# Patient Record
Sex: Female | Born: 1985 | Race: White | Smoking: Heavy tobacco smoker
Health system: Southern US, Community
[De-identification: ages and names within clinical notes are randomized; demographics above are authoritative.]

## PROBLEM LIST (undated history)

## (undated) DIAGNOSIS — N83299 Other ovarian cyst, unspecified side: Secondary | ICD-10-CM

## (undated) DIAGNOSIS — T7840XA Allergy, unspecified, initial encounter: Secondary | ICD-10-CM

## (undated) DIAGNOSIS — G43909 Migraine, unspecified, not intractable, without status migrainosus: Secondary | ICD-10-CM

## (undated) HISTORY — PX: CHOLECYSTECTOMY, LAPAROSCOPIC: SHX56

## (undated) HISTORY — DX: Allergy, unspecified, initial encounter: T78.40XA

## (undated) HISTORY — DX: Migraine, unspecified, not intractable, without status migrainosus: G43.909

## (undated) HISTORY — DX: Other ovarian cyst, unspecified side: N83.299

---

## 2009-04-09 HISTORY — PX: WISDOM TOOTH EXTRACTION: SHX21

## 2011-04-17 ENCOUNTER — Other Ambulatory Visit (HOSPITAL_COMMUNITY)
Admission: RE | Admit: 2011-04-17 | Discharge: 2011-04-17 | Disposition: A | Discharge status: Home / Self Care | Payer: Medicaid Other | Source: Ambulatory Visit | Attending: Family Medicine | Admitting: Family Medicine

## 2011-04-17 ENCOUNTER — Other Ambulatory Visit (HOSPITAL_COMMUNITY)
Admission: RE | Admit: 2011-04-17 | Discharge: 2011-04-17 | Disposition: A | Discharge status: Home / Self Care | Payer: Medicaid Other | Source: Ambulatory Visit | Attending: Unknown Physician Specialty | Admitting: Unknown Physician Specialty

## 2011-04-17 ENCOUNTER — Other Ambulatory Visit: Payer: Self-pay | Admitting: Nurse Practitioner

## 2011-04-17 DIAGNOSIS — R87619 Unspecified abnormal cytological findings in specimens from cervix uteri: Secondary | ICD-10-CM | POA: Insufficient documentation

## 2011-04-17 DIAGNOSIS — R87612 Low grade squamous intraepithelial lesion on cytologic smear of cervix (LGSIL): Secondary | ICD-10-CM | POA: Insufficient documentation

## 2013-10-13 ENCOUNTER — Ambulatory Visit: Payer: Self-pay | Admitting: Family

## 2013-10-28 ENCOUNTER — Ambulatory Visit (INDEPENDENT_AMBULATORY_CARE_PROVIDER_SITE_OTHER): Payer: Medicaid Other | Admitting: Family

## 2013-10-28 ENCOUNTER — Encounter: Payer: Self-pay | Admitting: Family

## 2013-10-28 VITALS — BP 120/76 | HR 75 | Temp 98.5°F | Ht 63.5 in | Wt 224.8 lb

## 2013-10-28 DIAGNOSIS — N946 Dysmenorrhea, unspecified: Secondary | ICD-10-CM

## 2013-10-28 DIAGNOSIS — Z0000 Encounter for general adult medical examination without abnormal findings: Secondary | ICD-10-CM

## 2013-10-28 DIAGNOSIS — R5383 Other fatigue: Secondary | ICD-10-CM

## 2013-10-28 DIAGNOSIS — R5381 Other malaise: Secondary | ICD-10-CM

## 2013-10-28 LAB — POCT URINE PREGNANCY: Preg Test, Ur: NEGATIVE

## 2013-10-28 MED ORDER — NORGESTIM-ETH ESTRAD TRIPHASIC 0.18/0.215/0.25 MG-25 MCG PO TABS: 1.0000 | ORAL_TABLET | Freq: Every day | ORAL | Status: DC

## 2013-10-28 NOTE — Patient Instructions (Addendum)
Fatigue Fatigue is a feeling of tiredness, lack of energy, lack of motivation, or feeling tired all the time. Having enough rest, good nutrition, and reducing stress will normally reduce fatigue. Consult your caregiver if it persists. The nature of your fatigue will help your caregiver to find out its cause. The treatment is based on the cause.  CAUSES  There are many causes for fatigue. Most of the time, fatigue can be traced to one or more of your habits or routines. Most causes fit into one or more of three general areas. They are: Lifestyle problems  Sleep disturbances.  Overwork.  Physical exertion.  Unhealthy habits.  Poor eating habits or eating disorders.  Alcohol and/or drug use .  Lack of proper nutrition (malnutrition). Psychological problems  Stress and/or anxiety problems.  Depression.  Grief.  Boredom. Medical Problems or Conditions  Anemia.  Pregnancy.  Thyroid gland problems.  Recovery from major surgery.  Continuous pain.  Emphysema or asthma that is not well controlled  Allergic conditions.  Diabetes.  Infections (such as mononucleosis).  Obesity.  Sleep disorders, such as sleep apnea.  Heart failure or other heart-related problems.  Cancer.  Kidney disease.  Liver disease.  Effects of certain medicines such as antihistamines, cough and cold remedies, prescription pain medicines, heart and blood pressure medicines, drugs used for treatment of cancer, and some antidepressants. SYMPTOMS  The symptoms of fatigue include:   Lack of energy.  Lack of drive (motivation).  Drowsiness.  Feeling of indifference to the surroundings. DIAGNOSIS  The details of how you feel help guide your caregiver in finding out what is causing the fatigue. You will be asked about your present and past health condition. It is important to review all medicines that you take, including prescription and non-prescription items. A thorough exam will be done.  You will be questioned about your feelings, habits, and normal lifestyle. Your caregiver may suggest blood tests, urine tests, or other tests to look for common medical causes of fatigue.  TREATMENT  Fatigue is treated by correcting the underlying cause. For example, if you have continuous pain or depression, treating these causes will improve how you feel. Similarly, adjusting the dose of certain medicines will help in reducing fatigue.  HOME CARE INSTRUCTIONS   Try to get the required amount of good sleep every night.  Eat a healthy and nutritious diet, and drink enough water throughout the day.  Practice ways of relaxing (including yoga or meditation).  Exercise regularly.  Make plans to change situations that cause stress. Act on those plans so that stresses decrease over time. Keep your work and personal routine reasonable.  Avoid street drugs and minimize use of alcohol.  Start taking a daily multivitamin after consulting your caregiver. SEEK MEDICAL CARE IF:   You have persistent tiredness, which cannot be accounted for.  You have fever.  You have unintentional weight loss.  You have headaches.  You have disturbed sleep throughout the night.  You are feeling sad.  You have constipation.  You have dry skin.  You have gained weight.  You are taking any new or different medicines that you suspect are causing fatigue.  You are unable to sleep at night.  You develop any unusual swelling of your legs or other parts of your body. SEEK IMMEDIATE MEDICAL CARE IF:   You are feeling confused.  Your vision is blurred.  You feel faint or pass out.  You develop severe headache.  You develop severe abdominal, pelvic, or   back pain.  You develop chest pain, shortness of breath, or an irregular or fast heartbeat.  You are unable to pass a normal amount of urine.  You develop abnormal bleeding such as bleeding from the rectum or you vomit blood.  You have thoughts  about harming yourself or committing suicide.  You are worried that you might harm someone else. MAKE SURE YOU:   Understand these instructions.  Will watch your condition.  Will get help right away if you are not doing well or get worse. Document Released: 01/21/2007 Document Revised: 06/18/2011 Document Reviewed: 01/21/2007 [facility] Patient Information 2015 [place], [place]. This information is not intended to replace advice given to you by your health care provider. Make sure you discuss any questions you have with your health care provider. Health Maintenance, Female A healthy lifestyle and preventative care can promote health and wellness.  Maintain regular health, dental, and eye exams.  Eat a healthy diet. Foods like vegetables, fruits, whole grains, low-fat dairy products, and lean protein foods contain the nutrients you need without too many calories. Decrease your intake of foods high in solid fats, added sugars, and salt. Get information about a proper diet from your caregiver, if necessary.  Regular physical exercise is one of the most important things you can do for your health. Most adults should get at least 150 minutes of moderate-intensity exercise (any activity that increases your heart rate and causes you to sweat) each week. In addition, most adults need muscle-strengthening exercises on 2 or more days a week.   Maintain a healthy weight. The body mass index (BMI) is a screening tool to identify possible weight problems. It provides an estimate of body fat based on height and weight. Your caregiver can help determine your BMI, and can help you achieve or maintain a healthy weight. For adults 20 years and older:  A BMI below 18.5 is considered underweight.  A BMI of 18.5 to 24.9 is normal.  A BMI of 25 to 29.9 is considered overweight.  A BMI of 30 and above is considered obese.  Maintain normal blood lipids and cholesterol by exercising and minimizing your intake of  saturated fat. Eat a balanced diet with plenty of fruits and vegetables. Blood tests for lipids and cholesterol should begin at age 35 and be repeated every 5 years. If your lipid or cholesterol levels are high, you are over 50, or you are a high risk for heart disease, you may need your cholesterol levels checked more frequently.Ongoing high lipid and cholesterol levels should be treated with medicines if diet and exercise are not effective.  If you smoke, find out from your caregiver how to quit. If you do not use tobacco, do not start.  Lung cancer screening is recommended for adults aged 68-80 years who are at high risk for developing lung cancer because of a history of smoking. Yearly low-dose computed tomography (CT) is recommended for people who have at least a 30-pack-year history of smoking and are a current smoker or have quit within the past 15 years. A pack year of smoking is smoking an average of 1 pack of cigarettes a day for 1 year (for example: 1 pack a day for 30 years or 2 packs a day for 15 years). Yearly screening should continue until the smoker has stopped smoking for at least 15 years. Yearly screening should also be stopped for people who develop a health problem that would prevent them from having lung cancer treatment.  If  you are pregnant, do not drink alcohol. If you are breastfeeding, be very cautious about drinking alcohol. If you are not pregnant and choose to drink alcohol, do not exceed 1 drink per day. One drink is considered to be 12 ounces (355 mL) of beer, 5 ounces (148 mL) of wine, or 1.5 ounces (44 mL) of liquor.  Avoid use of street drugs. Do not share needles with anyone. Ask for help if you need support or instructions about stopping the use of drugs.  High blood pressure causes heart disease and increases the risk of stroke. Blood pressure should be checked at least every 1 to 2 years. Ongoing high blood pressure should be treated with medicines, if weight loss  and exercise are not effective.  If you are 36 to 30 years old, ask your caregiver if you should take aspirin to prevent strokes.  Diabetes screening involves taking a blood sample to check your fasting blood sugar level. This should be done once every 3 years, after age 64, if you are within normal weight and without risk factors for diabetes. Testing should be considered at a younger age or be carried out more frequently if you are overweight and have at least 1 risk factor for diabetes.  Breast cancer screening is essential preventative care for women. You should practice "breast self-awareness." This means understanding the normal appearance and feel of your breasts and may include breast self-examination. Any changes detected, no matter how small, should be reported to a caregiver. Women in their 33s and 30s should have a clinical breast exam (CBE) by a caregiver as part of a regular health exam every 1 to 3 years. After age 55, women should have a CBE every year. Starting at age 29, women should consider having a mammogram (breast X-ray) every year. Women who have a family history of breast cancer should talk to their caregiver about genetic screening. Women at a high risk of breast cancer should talk to their caregiver about having an MRI and a mammogram every year.  Breast cancer gene (BRCA)-related cancer risk assessment is recommended for women who have family members with BRCA-related cancers. BRCA-related cancers include breast, ovarian, tubal, and peritoneal cancers. Having family members with these cancers may be associated with an increased risk for harmful changes (mutations) in the breast cancer genes BRCA1 and BRCA2. Results of the assessment will determine the need for genetic counseling and BRCA1 and BRCA2 testing.  The Pap test is a screening test for cervical cancer. Women should have a Pap test starting at age 12. Between ages 51 and 21, Pap tests should be repeated every 2 years.  Beginning at age 65, you should have a Pap test every 3 years as long as the past 3 Pap tests have been normal. If you had a hysterectomy for a problem that was not cancer or a condition that could lead to cancer, then you no longer need Pap tests. If you are between ages 38 and 51, and you have had normal Pap tests going back 10 years, you no longer need Pap tests. If you have had past treatment for cervical cancer or a condition that could lead to cancer, you need Pap tests and screening for cancer for at least 20 years after your treatment. If Pap tests have been discontinued, risk factors (such as a new sexual partner) need to be reassessed to determine if screening should be resumed. Some women have medical problems that increase the chance of getting cervical cancer.  In these cases, your caregiver may recommend more frequent screening and Pap tests.  The human papillomavirus (HPV) test is an additional test that may be used for cervical cancer screening. The HPV test looks for the virus that can cause the cell changes on the cervix. The cells collected during the Pap test can be tested for HPV. The HPV test could be used to screen women aged 63 years and older, and should be used in women of any age who have unclear Pap test results. After the age of 48, women should have HPV testing at the same frequency as a Pap test.  Colorectal cancer can be detected and often prevented. Most routine colorectal cancer screening begins at the age of 65 and continues through age 46. However, your caregiver may recommend screening at an earlier age if you have risk factors for colon cancer. On a yearly basis, your caregiver may provide home test kits to check for hidden blood in the stool. Use of a small camera at the end of a tube, to directly examine the colon (sigmoidoscopy or colonoscopy), can detect the earliest forms of colorectal cancer. Talk to your caregiver about this at age 54, when routine screening begins.  Direct examination of the colon should be repeated every 5 to 10 years through age 50, unless early forms of pre-cancerous polyps or small growths are found.  Hepatitis C blood testing is recommended for all people born from [birth year] through 1967 and any individual with known risks for hepatitis C.  Practice safe sex. Use condoms and avoid high-risk sexual practices to reduce the spread of sexually transmitted infections (STIs). Sexually active women aged 57 and younger should be checked for Chlamydia, which is a common sexually transmitted infection. Older women with new or multiple partners should also be tested for Chlamydia. Testing for other STIs is recommended if you are sexually active and at increased risk.  Osteoporosis is a disease in which the bones lose minerals and strength with aging. This can result in serious bone fractures. The risk of osteoporosis can be identified using a bone density scan. Women ages 42 and over and women at risk for fractures or osteoporosis should discuss screening with their caregivers. Ask your caregiver whether you should be taking a calcium supplement or vitamin D to reduce the rate of osteoporosis.  Menopause can be associated with physical symptoms and risks. Hormone replacement therapy is available to decrease symptoms and risks. You should talk to your caregiver about whether hormone replacement therapy is right for you.  Use sunscreen. Apply sunscreen liberally and repeatedly throughout the day. You should seek shade when your shadow is shorter than you. Protect yourself by wearing long sleeves, pants, a wide-brimmed hat, and sunglasses year round, whenever you are outdoors.  Notify your caregiver of new moles or changes in moles, especially if there is a change in shape or color. Also notify your caregiver if a mole is larger than the size of a pencil eraser.  Stay current with your immunizations. Document Released: 10/09/2010 Document Revised: 07/21/2012  Document Reviewed: 02/25/2013 [facility] Patient Information 2015 [place], [place]. This information is not intended to replace advice given to you by your health care provider. Make sure you discuss any questions you have with your health care provider. Smoking Cessation Quitting smoking is important to your health and has many advantages. However, it is not always easy to quit since nicotine is a very addictive drug. Often times, people try 3 times or more before  being able to quit. This document explains the best ways for you to prepare to quit smoking. Quitting takes hard work and a lot of effort, but you can do it. ADVANTAGES OF QUITTING SMOKING  You will live longer, feel better, and live better.  Your body will feel the impact of quitting smoking almost immediately.  Within 20 minutes, blood pressure decreases. Your pulse returns to its normal level.  After 8 hours, carbon monoxide levels in the blood return to normal. Your oxygen level increases.  After 24 hours, the chance of having a heart attack starts to decrease. Your breath, hair, and body stop smelling like smoke.  After 48 hours, damaged nerve endings begin to recover. Your sense of taste and smell improve.  After 72 hours, the body is virtually free of nicotine. Your bronchial tubes relax and breathing becomes easier.  After 2 to 12 weeks, lungs can hold more air. Exercise becomes easier and circulation improves.  The risk of having a heart attack, stroke, cancer, or lung disease is greatly reduced.  After 1 year, the risk of coronary heart disease is cut in half.  After 5 years, the risk of stroke falls to the same as a nonsmoker.  After 10 years, the risk of lung cancer is cut in half and the risk of other cancers decreases significantly.  After 15 years, the risk of coronary heart disease drops, usually to the level of a nonsmoker.  If you are pregnant, quitting smoking will improve your chances of having a healthy  baby.  The people you live with, especially any children, will be healthier.  You will have extra money to spend on things other than cigarettes. QUESTIONS TO THINK ABOUT BEFORE ATTEMPTING TO QUIT You may want to talk about your answers with your caregiver.  Why do you want to quit?  If you tried to quit in the past, what helped and what did not?  What will be the most difficult situations for you after you quit? How will you plan to handle them?  Who can help you through the tough times? Your family? Friends? A caregiver?  What pleasures do you get from smoking? What ways can you still get pleasure if you quit? Here are some questions to ask your caregiver:  How can you help me to be successful at quitting?  What medicine do you think would be best for me and how should I take it?  What should I do if I need more help?  What is smoking withdrawal like? How can I get information on withdrawal? GET READY  Set a quit date.  Change your environment by getting rid of all cigarettes, ashtrays, matches, and lighters in your home, car, or work. Do not let people smoke in your home.  Review your past attempts to quit. Think about what worked and what did not. GET SUPPORT AND ENCOURAGEMENT You have a better chance of being successful if you have help. You can get support in many ways.  Tell your family, friends, and co-workers that you are going to quit and need their support. Ask them not to smoke around you.  Get individual, group, or telephone counseling and support. Programs are available at [employer] and health centers. Call your local health department for information about programs in your area.  Spiritual beliefs and practices may help some smokers quit.  Download a "quit meter" on your computer to keep track of quit statistics, such as how long you have gone  without smoking, cigarettes not smoked, and money saved.  Get a self-help book about quitting smoking and  staying off of tobacco. Wilson yourself from urges to smoke. Talk to someone, go for a walk, or occupy your time with a task.  Change your normal routine. Take a different route to work. Drink tea instead of coffee. Eat breakfast in a different place.  Reduce your stress. Take a hot bath, exercise, or read a book.  Plan something enjoyable to do every day. Reward yourself for not smoking.  Explore interactive web-based programs that specialize in helping you quit. GET MEDICINE AND USE IT CORRECTLY Medicines can help you stop smoking and decrease the urge to smoke. Combining medicine with the above behavioral methods and support can greatly increase your chances of successfully quitting smoking.  Nicotine replacement therapy helps deliver nicotine to your body without the negative effects and risks of smoking. Nicotine replacement therapy includes nicotine gum, lozenges, inhalers, nasal sprays, and skin patches. Some may be available over-the-counter and others require a prescription.  Antidepressant medicine helps people abstain from smoking, but how this works is unknown. This medicine is available by prescription.  Nicotinic receptor partial agonist medicine simulates the effect of nicotine in your brain. This medicine is available by prescription. Ask your caregiver for advice about which medicines to use and how to use them based on your health history. Your caregiver will tell you what side effects to look out for if you choose to be on a medicine or therapy. Carefully read the information on the package. Do not use any other product containing nicotine while using a nicotine replacement product.  RELAPSE OR DIFFICULT SITUATIONS Most relapses occur within the first 3 months after quitting. Do not be discouraged if you start smoking again. Remember, most people try several times before finally quitting. You may have symptoms of withdrawal because your body  is used to nicotine. You may crave cigarettes, be irritable, feel very hungry, cough often, get headaches, or have difficulty concentrating. The withdrawal symptoms are only temporary. They are strongest when you first quit, but they will go away within 10-14 days. To reduce the chances of relapse, try to:  Avoid drinking alcohol. Drinking lowers your chances of successfully quitting.  Reduce the amount of caffeine you consume. Once you quit smoking, the amount of caffeine in your body increases and can give you symptoms, such as a rapid heartbeat, sweating, and anxiety.  Avoid smokers because they can make you want to smoke.  Do not let weight gain distract you. Many smokers will gain weight when they quit, usually less than 10 pounds. Eat a healthy diet and stay active. You can always lose the weight gained after you quit.  Find ways to improve your mood other than smoking. FOR MORE INFORMATION  www.smokefree.gov  Document Released: 03/20/2001 Document Revised: 09/25/2011 Document Reviewed: 07/05/2011 [facility] Patient Information 2015 [place], [place]. This information is not intended to replace advice given to you by your health care provider. Make sure you discuss any questions you have with your health care provider.

## 2013-10-28 NOTE — Progress Notes (Signed)
   Subjective:    Patient ID: Kristin Ballard, female    DOB: 03/18/1986, 28 y.o.   MRN: [id number]  HPI Pt presents to office today to establish care.  Pt currently taking birth control. States she is having week long cramps with menses. She states she currently takes motrin as needed, but states it is not helping. Pt also states she has been having overwhelming fatigue. Pt states she is sleeping well and feels as if she is getting enough sleep, but still feels fatigued.     Review of Systems  Constitutional: Positive for fatigue.  HENT: Negative.   Eyes: Negative.   Respiratory: Negative.  Negative for shortness of breath.   Cardiovascular: Negative.  Negative for palpitations.  Gastrointestinal: Negative.   Endocrine: Negative.   Genitourinary: Negative.   Musculoskeletal: Positive for back pain.  Neurological: Negative.  Negative for headaches.  Hematological: Negative.   Psychiatric/Behavioral: Negative.   All other systems reviewed and are negative.      Objective:   Physical Exam  Vitals reviewed. Constitutional: She is oriented to person, place, and time. She appears well-developed and well-nourished. No distress.  HENT:  Head: Normocephalic and atraumatic.  Right Ear: External ear normal.  Mouth/Throat: Oropharynx is clear and moist.  Eyes: Pupils are equal, round, and reactive to light.  Neck: Normal range of motion. Neck supple. No thyromegaly present.  Cardiovascular: Normal rate, regular rhythm, normal heart sounds and intact distal pulses.   No murmur heard. Pulmonary/Chest: Effort normal and breath sounds normal. No respiratory distress. She has no wheezes.  Abdominal: Soft. Bowel sounds are normal. She exhibits no distension. There is no tenderness.  Musculoskeletal: Normal range of motion. She exhibits no edema and no tenderness.  Neurological: She is alert and oriented to person, place, and time. She has normal reflexes. No cranial nerve deficit.  Skin: Skin  is warm and dry.  Psychiatric: She has a normal mood and affect. Her behavior is normal. Judgment and thought content normal.    BP 120/76  Pulse 75  Temp(Src) 98.5 F (36.9 C) (Oral)  Ht 5' 3.5" (1.613 m)  Wt 224 lb 12.8 oz (101.969 kg)  BMI 39.19 kg/m2  LMP 10/10/2013       Assessment & Plan:  1. Annual physical exam - CMP14+EGFR  2. Other malaise and fatigue - Anemia Profile B - POCT urine pregnancy - CMP14+EGFR - Thyroid Panel With TSH - Vit D  25 hydroxy (rtn osteoporosis monitoring)  3. Menstrual cramps -Discussed risks of DVT and dicussed s/s - Norgestimate-Ethinyl Estradiol Triphasic (ORTHO TRI-CYCLEN LO) 0.18/0.215/0.25 MG-25 MCG tab; Take 1 tablet by mouth daily.  Dispense: 3 Package; Refill: 4   Continue all meds Labs pending Health Maintenance reviewed Diet and exercise encouraged Smoking cessation discussed RTO 1 year  Evelina Dun, FNP

## 2013-10-29 LAB — ANEMIA PROFILE B
BASOS: 0 %
Basophils Absolute: 0 10*3/uL (ref 0.0–0.2)
EOS ABS: 0.1 10*3/uL (ref 0.0–0.4)
Eos: 1 %
FERRITIN: 42 ng/mL (ref 15–150)
FOLATE: 10.8 ng/mL (ref >3.0)
HCT: 39.4 % (ref 34.0–46.6)
HEMOGLOBIN: 13.5 g/dL (ref 11.1–15.9)
IMMATURE GRANS (ABS): 0 10*3/uL (ref 0.0–0.1)
IMMATURE GRANULOCYTES: 0 %
IRON: 52 ug/dL (ref 35–155)
Iron Saturation: 14 % — ABNORMAL LOW (ref 15–55)
LYMPHS: 46 %
Lymphocytes Absolute: 3 10*3/uL (ref 0.7–3.1)
MCH: 31 pg (ref 26.6–33.0)
MCHC: 34.3 g/dL (ref 31.5–35.7)
MCV: 90 fL (ref 79–97)
Monocytes Absolute: 0.3 10*3/uL (ref 0.1–0.9)
Monocytes: 5 %
NEUTROS PCT: 48 %
Neutrophils Absolute: 3.1 10*3/uL (ref 1.4–7.0)
PLATELETS: 287 10*3/uL (ref 150–379)
RBC: 4.36 x10E6/uL (ref 3.77–5.28)
RDW: 13 % (ref 12.3–15.4)
RETIC CT PCT: 0.9 % (ref 0.6–2.6)
TIBC: 373 ug/dL (ref 250–450)
UIBC: 321 ug/dL (ref 150–375)
VITAMIN B 12: 452 pg/mL (ref 211–946)
WBC: 6.5 10*3/uL (ref 3.4–10.8)

## 2013-10-29 LAB — THYROID PANEL WITH TSH
Free Thyroxine Index: 1.9 (ref 1.2–4.9)
T3 Uptake Ratio: 20 % — ABNORMAL LOW (ref 24–39)
T4 TOTAL: 9.6 ug/dL (ref 4.5–12.0)
TSH: 1.07 u[IU]/mL (ref 0.450–4.500)

## 2013-10-29 LAB — CMP14+EGFR
ALBUMIN: 4.2 g/dL (ref 3.5–5.5)
ALK PHOS: 66 IU/L (ref 39–117)
ALT: 26 IU/L (ref 0–32)
AST: 18 IU/L (ref 0–40)
Albumin/Globulin Ratio: 1.7 (ref 1.1–2.5)
BUN / CREAT RATIO: 10 (ref 8–20)
BUN: 8 mg/dL (ref 6–20)
CHLORIDE: 105 mmol/L (ref 97–108)
CO2: 23 mmol/L (ref 18–29)
Calcium: 9.3 mg/dL (ref 8.7–10.2)
Creatinine, Ser: 0.84 mg/dL (ref 0.57–1.00)
GFR calc non Af Amer: 94 mL/min/{1.73_m2} (ref >59)
GFR, EST AFRICAN AMERICAN: 108 mL/min/{1.73_m2} (ref >59)
Globulin, Total: 2.5 g/dL (ref 1.5–4.5)
Glucose: 101 mg/dL — ABNORMAL HIGH (ref 65–99)
POTASSIUM: 4.7 mmol/L (ref 3.5–5.2)
SODIUM: 141 mmol/L (ref 134–144)
Total Protein: 6.7 g/dL (ref 6.0–8.5)

## 2013-10-29 LAB — VITAMIN D 25 HYDROXY (VIT D DEFICIENCY, FRACTURES): Vit D, 25-Hydroxy: 17.4 ng/mL — ABNORMAL LOW (ref 30.0–100.0)

## 2013-11-02 ENCOUNTER — Other Ambulatory Visit: Payer: Self-pay | Admitting: Family

## 2013-11-02 MED ORDER — VITAMIN D (ERGOCALCIFEROL) 1.25 MG (50000 UNIT) PO CAPS: 50000.0000 [IU] | ORAL_CAPSULE | ORAL | Status: DC

## 2014-05-20 ENCOUNTER — Ambulatory Visit (INDEPENDENT_AMBULATORY_CARE_PROVIDER_SITE_OTHER): Payer: Medicaid Other | Admitting: Family

## 2014-05-20 ENCOUNTER — Encounter: Payer: Self-pay | Admitting: Family

## 2014-05-20 VITALS — BP 103/72 | HR 89 | Temp 97.2°F | Ht 63.5 in | Wt 241.8 lb

## 2014-05-20 DIAGNOSIS — E559 Vitamin D deficiency, unspecified: Secondary | ICD-10-CM | POA: Insufficient documentation

## 2014-05-20 DIAGNOSIS — Z0000 Encounter for general adult medical examination without abnormal findings: Secondary | ICD-10-CM

## 2014-05-20 DIAGNOSIS — Z01419 Encounter for gynecological examination (general) (routine) without abnormal findings: Secondary | ICD-10-CM

## 2014-05-20 DIAGNOSIS — M545 Low back pain: Secondary | ICD-10-CM

## 2014-05-20 DIAGNOSIS — Z23 Encounter for immunization: Secondary | ICD-10-CM

## 2014-05-20 LAB — POCT UA - MICROSCOPIC ONLY
CRYSTALS, UR, HPF, POC: NEGATIVE
Casts, Ur, LPF, POC: NEGATIVE
Mucus, UA: NEGATIVE
RBC, URINE, MICROSCOPIC: NEGATIVE
Yeast, UA: NEGATIVE

## 2014-05-20 LAB — POCT URINALYSIS DIPSTICK
BILIRUBIN UA: NEGATIVE
Blood, UA: NEGATIVE
GLUCOSE UA: NEGATIVE
KETONES UA: NEGATIVE
LEUKOCYTES UA: NEGATIVE
Nitrite, UA: NEGATIVE
PH UA: 6
Protein, UA: NEGATIVE
Spec Grav, UA: 1.02
Urobilinogen, UA: NEGATIVE

## 2014-05-20 MED ORDER — CYCLOBENZAPRINE HCL 10 MG PO TABS: 10.0000 mg | ORAL_TABLET | Freq: Three times a day (TID) | ORAL | Status: DC | PRN

## 2014-05-20 NOTE — Patient Instructions (Addendum)
Health Maintenance Adopting a healthy lifestyle and getting preventive care can go a long way to promote health and wellness. Talk with your health care provider about what schedule of regular examinations is right for you. This is a good chance for you to check in with your provider about disease prevention and staying healthy. In between checkups, there are plenty of things you can do on your own. Experts have done a lot of research about which lifestyle changes and preventive measures are most likely to keep you healthy. Ask your health care provider for more information. WEIGHT AND DIET  Eat a healthy diet  Be sure to include plenty of vegetables, fruits, low-fat dairy products, and lean protein.  Do not eat a lot of foods high in solid fats, added sugars, or salt.  Get regular exercise. This is one of the most important things you can do for your health.  Most adults should exercise for at least 150 minutes each week. The exercise should increase your heart rate and make you sweat (moderate-intensity exercise).  Most adults should also do strengthening exercises at least twice a week. This is in addition to the moderate-intensity exercise.  Maintain a healthy weight  Body mass index (BMI) is a measurement that can be used to identify possible weight problems. It estimates body fat based on height and weight. Your health care provider can help determine your BMI and help you achieve or maintain a healthy weight.  For females 25 years of age and older:   A BMI below 18.5 is considered underweight.  A BMI of 18.5 to 24.9 is normal.  A BMI of 25 to 29.9 is considered overweight.  A BMI of 30 and above is considered obese.  Watch levels of cholesterol and blood lipids  You should start having your blood tested for lipids and cholesterol at 31 years of age, then have this test every 5 years.  You may need to have your cholesterol levels checked more often if:  Your lipid or  cholesterol levels are high.  You are older than 31 years of age.  You are at high risk for heart disease.  CANCER SCREENING   Lung Cancer  Lung cancer screening is recommended for adults 97-92 years old who are at high risk for lung cancer because of a history of smoking.  A yearly low-dose CT scan of the lungs is recommended for people who:  Currently smoke.  Have quit within the past 15 years.  Have at least a 30-pack-year history of smoking. A pack year is smoking an average of one pack of cigarettes a day for 1 year.  Yearly screening should continue until it has been 15 years since you quit.  Yearly screening should stop if you develop a health problem that would prevent you from having lung cancer treatment.  Breast Cancer  Practice breast self-awareness. This means understanding how your breasts normally appear and feel.  It also means doing regular breast self-exams. Let your health care provider know about any changes, no matter how small.  If you are in your 20s or 30s, you should have a clinical breast exam (CBE) by a health care provider every 1-3 years as part of a regular health exam.  If you are 76 or older, have a CBE every year. Also consider having a breast X-ray (mammogram) every year.  If you have a family history of breast cancer, talk to your health care provider about genetic screening.  If you are  at high risk for breast cancer, talk to your health care provider about having an MRI and a mammogram every year.  Breast cancer gene (BRCA) assessment is recommended for women who have family members with BRCA-related cancers. BRCA-related cancers include:  Breast.  Ovarian.  Tubal.  Peritoneal cancers.  Results of the assessment will determine the need for genetic counseling and BRCA1 and BRCA2 testing. Cervical Cancer Routine pelvic examinations to screen for cervical cancer are no longer recommended for nonpregnant women who are considered low  risk for cancer of the pelvic organs (ovaries, uterus, and vagina) and who do not have symptoms. A pelvic examination may be necessary if you have symptoms including those associated with pelvic infections. Ask your health care provider if a screening pelvic exam is right for you.   The Pap test is the screening test for cervical cancer for women who are considered at risk.  If you had a hysterectomy for a problem that was not cancer or a condition that could lead to cancer, then you no longer need Pap tests.  If you are older than 65 years, and you have had normal Pap tests for the past 10 years, you no longer need to have Pap tests.  If you have had past treatment for cervical cancer or a condition that could lead to cancer, you need Pap tests and screening for cancer for at least 20 years after your treatment.  If you no longer get a Pap test, assess your risk factors if they change (such as having a new sexual partner). This can affect whether you should start being screened again.  Some women have medical problems that increase their chance of getting cervical cancer. If this is the case for you, your health care provider may recommend more frequent screening and Pap tests.  The human papillomavirus (HPV) test is another test that may be used for cervical cancer screening. The HPV test looks for the virus that can cause cell changes in the cervix. The cells collected during the Pap test can be tested for HPV.  The HPV test can be used to screen women 30 years of age and older. Getting tested for HPV can extend the interval between normal Pap tests from three to five years.  An HPV test also should be used to screen women of any age who have unclear Pap test results.  After 30 years of age, women should have HPV testing as often as Pap tests.  Colorectal Cancer  This type of cancer can be detected and often prevented.  Routine colorectal cancer screening usually begins at 31 years of  age and continues through 31 years of age.  Your health care provider may recommend screening at an earlier age if you have risk factors for colon cancer.  Your health care provider may also recommend using home test kits to check for hidden blood in the stool.  A small camera at the end of a tube can be used to examine your colon directly (sigmoidoscopy or colonoscopy). This is done to check for the earliest forms of colorectal cancer.  Routine screening usually begins at age 50.  Direct examination of the colon should be repeated every 5-10 years through 31 years of age. However, you may need to be screened more often if early forms of precancerous polyps or small growths are found. Skin Cancer  Check your skin from head to toe regularly.  Tell your health care provider about any new moles or changes in   moles, especially if there is a change in a mole's shape or color.  Also tell your health care provider if you have a mole that is larger than the size of a pencil eraser.  Always use sunscreen. Apply sunscreen liberally and repeatedly throughout the day.  Protect yourself by wearing long sleeves, pants, a wide-brimmed hat, and sunglasses whenever you are outside. HEART DISEASE, DIABETES, AND HIGH BLOOD PRESSURE   Have your blood pressure checked at least every 1-2 years. High blood pressure causes heart disease and increases the risk of stroke.  If you are between 75 years and 42 years old, ask your health care provider if you should take aspirin to prevent strokes.  Have regular diabetes screenings. This involves taking a blood sample to check your fasting blood sugar level.  If you are at a normal weight and have a low risk for diabetes, have this test once every three years after 31 years of age.  If you are overweight and have a high risk for diabetes, consider being tested at a younger age or more often. PREVENTING INFECTION  Hepatitis B  If you have a higher risk for  hepatitis B, you should be screened for this virus. You are considered at high risk for hepatitis B if:  You were born in a country where hepatitis B is common. Ask your health care provider which countries are considered high risk.  Your parents were born in a high-risk country, and you have not been immunized against hepatitis B (hepatitis B vaccine).  You have HIV or AIDS.  You use needles to inject street drugs.  You live with someone who has hepatitis B.  You have had sex with someone who has hepatitis B.  You get hemodialysis treatment.  You take certain medicines for conditions, including cancer, organ transplantation, and autoimmune conditions. Hepatitis C  Blood testing is recommended for:  Everyone born from [birth year] through 1967.  Anyone with known risk factors for hepatitis C. Sexually transmitted infections (STIs)  You should be screened for sexually transmitted infections (STIs) including gonorrhea and chlamydia if:  You are sexually active and are younger than 31 years of age.  You are older than 31 years of age and your health care provider tells you that you are at risk for this type of infection.  Your sexual activity has changed since you were last screened and you are at an increased risk for chlamydia or gonorrhea. Ask your health care provider if you are at risk.  If you do not have HIV, but are at risk, it may be recommended that you take a prescription medicine daily to prevent HIV infection. This is called pre-exposure prophylaxis (PrEP). You are considered at risk if:  You are sexually active and do not regularly use condoms or know the HIV status of your partner(s).  You take drugs by injection.  You are sexually active with a partner who has HIV. Talk with your health care provider about whether you are at high risk of being infected with HIV. If you choose to begin PrEP, you should first be tested for HIV. You should then be tested every 3 months for  as long as you are taking PrEP.  PREGNANCY   If you are premenopausal and you may become pregnant, ask your health care provider about preconception counseling.  If you may become pregnant, take 400 to 800 micrograms (mcg) of folic acid every day.  If you want to prevent pregnancy, talk to your  health care provider about birth control (contraception). OSTEOPOROSIS AND MENOPAUSE   Osteoporosis is a disease in which the bones lose minerals and strength with aging. This can result in serious bone fractures. Your risk for osteoporosis can be identified using a bone density scan.  If you are 43 years of age or older, or if you are at risk for osteoporosis and fractures, ask your health care provider if you should be screened.  Ask your health care provider whether you should take a calcium or vitamin D supplement to lower your risk for osteoporosis.  Menopause may have certain physical symptoms and risks.  Hormone replacement therapy may reduce some of these symptoms and risks. Talk to your health care provider about whether hormone replacement therapy is right for you.  HOME CARE INSTRUCTIONS   Schedule regular health, dental, and eye exams.  Stay current with your immunizations.   Do not use any tobacco products including cigarettes, chewing tobacco, or electronic cigarettes.  If you are pregnant, do not drink alcohol.  If you are breastfeeding, limit how much and how often you drink alcohol.  Limit alcohol intake to no more than 1 drink per day for nonpregnant women. One drink equals 12 ounces of beer, 5 ounces of wine, or 1 ounces of hard liquor.  Do not use street drugs.  Do not share needles.  Ask your health care provider for help if you need support or information about quitting drugs.  Tell your health care provider if you often feel depressed.  Tell your health care provider if you have ever been abused or do not feel safe at home. Document Released: 10/09/2010  Document Revised: 08/10/2013 Document Reviewed: 02/25/2013 [facility] Patient Information 2015 [place], [place]. This information is not intended to replace advice given to you by your health care provider. Make sure you discuss any questions you have with your health care provider. Back Pain, Adult Low back pain is very common. About 1 in 5 people have back pain.The cause of low back pain is rarely dangerous. The pain often gets better over time.About half of people with a sudden onset of back pain feel better in just 2 weeks. About 8 in 10 people feel better by 6 weeks.  CAUSES Some common causes of back pain include:  Strain of the muscles or ligaments supporting the spine.  Wear and tear (degeneration) of the spinal discs.  Arthritis.  Direct injury to the back. DIAGNOSIS Most of the time, the direct cause of low back pain is not known.However, back pain can be treated effectively even when the exact cause of the pain is unknown.Answering your caregiver's questions about your overall health and symptoms is one of the most accurate ways to make sure the cause of your pain is not dangerous. If your caregiver needs more information, he or she may order lab work or imaging tests (X-rays or MRIs).However, even if imaging tests show changes in your back, this usually does not require surgery. HOME CARE INSTRUCTIONS For many people, back pain returns.Since low back pain is rarely dangerous, it is often a condition that people can learn to [facility] their own.   Remain active. It is stressful on the back to sit or stand in one place. Do not sit, drive, or stand in one place for more than 30 minutes at a time. Take short walks on level surfaces as soon as pain allows.Try to increase the length of time you walk each day.  Do not stay in bed.Resting  more than 1 or 2 days can delay your recovery.  Do not avoid exercise or work.Your body is made to move.It is not dangerous to be active, even  though your back may hurt.Your back will likely heal faster if you return to being active before your pain is gone.  Pay attention to your body when you bend and lift. Many people have less discomfortwhen lifting if they bend their knees, keep the load close to their bodies,and avoid twisting. Often, the most comfortable positions are those that put less stress on your recovering back.  Find a comfortable position to sleep. Use a firm mattress and lie on your side with your knees slightly bent. If you lie on your back, put a pillow under your knees.  Only take over-the-counter or prescription medicines as directed by your caregiver. Over-the-counter medicines to reduce pain and inflammation are often the most helpful.Your caregiver may prescribe muscle relaxant drugs.These medicines help dull your pain so you can more quickly return to your normal activities and healthy exercise.  Put ice on the injured area.  Put ice in a plastic bag.  Place a towel between your skin and the bag.  Leave the ice on for 15-20 minutes, 03-04 times a day for the first 2 to 3 days. After that, ice and heat may be alternated to reduce pain and spasms.  Ask your caregiver about trying back exercises and gentle massage. This may be of some benefit.  Avoid feeling anxious or stressed.Stress increases muscle tension and can worsen back pain.It is important to recognize when you are anxious or stressed and learn ways to manage it.Exercise is a great option. SEEK MEDICAL CARE IF:  You have pain that is not relieved with rest or medicine.  You have pain that does not improve in 1 week.  You have new symptoms.  You are generally not feeling well. SEEK IMMEDIATE MEDICAL CARE IF:   You have pain that radiates from your back into your legs.  You develop new bowel or bladder control problems.  You have unusual weakness or numbness in your arms or legs.  You develop nausea or vomiting.  You develop  abdominal pain.  You feel faint. Document Released: 03/26/2005 Document Revised: 09/25/2011 Document Reviewed: 07/28/2013 [facility] Patient Information 2015 [place], [place]. This information is not intended to replace advice given to you by your health care provider. Make sure you discuss any questions you have with your health care provider.

## 2014-05-20 NOTE — Progress Notes (Signed)
Subjective:    Patient ID: Kristin Ballard, female    DOB: [birth year], 29 y.o.   MRN: [id number]  Gynecologic Exam Associated symptoms include back pain. Pertinent negatives include no headaches.  Back Pain This is a new problem. The current episode started more than 1 month ago. The problem occurs intermittently. The problem has been waxing and waning since onset. The pain is present in the lumbar spine. The quality of the pain is described as aching. The pain does not radiate. The pain is mild. The symptoms are aggravated by twisting and bending. Pertinent negatives include no headaches or leg pain. She has tried NSAIDs and bed rest for the symptoms. The treatment provided mild relief.   Pt presents to the office for CPE with pap. Pt currently taking birth control with no complaints. Pt states she gets "boils on her underwear line at times". Pt denies any  palpitations, SOB, or edema at this time.     Review of Systems  Constitutional: Negative.   HENT: Negative.   Eyes: Negative.   Respiratory: Negative.  Negative for shortness of breath.   Cardiovascular: Negative.  Negative for palpitations.  Gastrointestinal: Negative.   Endocrine: Negative.   Genitourinary: Negative.   Musculoskeletal: Positive for back pain.  Neurological: Negative.  Negative for headaches.  Hematological: Negative.   Psychiatric/Behavioral: Negative.   All other systems reviewed and are negative.      Objective:   Physical Exam  Constitutional: She is oriented to person, place, and time. She appears well-developed and well-nourished. No distress.  HENT:  Head: Normocephalic and atraumatic.  Right Ear: External ear normal.  Mouth/Throat: Oropharynx is clear and moist.  Eyes: Pupils are equal, round, and reactive to light.  Neck: Normal range of motion. Neck supple. No thyromegaly present.  Cardiovascular: Normal rate, regular rhythm, normal heart sounds and intact distal pulses.   No murmur  heard. Pulmonary/Chest: Effort normal and breath sounds normal. No respiratory distress. She has no wheezes. Right breast exhibits no inverted nipple, no mass, no nipple discharge, no skin change and no tenderness. Left breast exhibits no inverted nipple, no mass, no nipple discharge, no skin change and no tenderness. Breasts are symmetrical.  Abdominal: Soft. Bowel sounds are normal. She exhibits no distension. There is no tenderness.  Genitourinary: Vagina normal.  Bimanual exam- no adnexal masses or tenderness, ovaries nonpalpable   Cervix parous and pink- No discharge   Musculoskeletal: Normal range of motion. She exhibits no edema or tenderness.  Neurological: She is alert and oriented to person, place, and time. She has normal reflexes. No cranial nerve deficit.  Skin: Skin is warm and dry.  Psychiatric: She has a normal mood and affect. Her behavior is normal. Judgment and thought content normal.  Vitals reviewed.   BP 103/72 mmHg  Pulse 89  Temp(Src) 97.2 F (36.2 C) (Oral)  Ht 5' 3.5" (1.613 m)  Wt 241 lb 12.8 oz (109.68 kg)  BMI 42.16 kg/m2  LMP 05/18/2014 (Exact Date)       Assessment & Plan:  1. Encounter for routine gynecological examination - POCT urinalysis dipstick - POCT UA - Microscopic Only - Pap IG w/ reflex to HPV when ASC-U  2. Vitamin D deficiency - Vit D  25 hydroxy (rtn osteoporosis monitoring)  3. Annual physical exam - CMP14+EGFR - Lipid panel - Thyroid Panel With TSH - Vit D  25 hydroxy (rtn osteoporosis monitoring) - Pap IG w/ reflex to HPV when ASC-U  4. Midline low  back pain, with sciatica presence unspecified -Rest -Ice -Flexeril 10 mg TID prn -Sedation precaution discussed   Continue all meds Labs pending Health Maintenance reviewed Diet and exercise encouraged RTO 1 year  Evelina Dun, FNP

## 2014-05-21 LAB — CMP14+EGFR
A/G RATIO: 1.6 (ref 1.1–2.5)
ALT: 21 IU/L (ref 0–32)
AST: 18 IU/L (ref 0–40)
Albumin: 4.1 g/dL (ref 3.5–5.5)
Alkaline Phosphatase: 68 IU/L (ref 39–117)
BUN/Creatinine Ratio: 19 (ref 8–20)
BUN: 14 mg/dL (ref 6–20)
Bilirubin Total: <0.2 mg/dL (ref 0.0–1.2)
CALCIUM: 8.9 mg/dL (ref 8.7–10.2)
CO2: 21 mmol/L (ref 18–29)
CREATININE: 0.73 mg/dL (ref 0.57–1.00)
Chloride: 104 mmol/L (ref 97–108)
GFR calc Af Amer: 128 mL/min/{1.73_m2} (ref >59)
GFR, EST NON AFRICAN AMERICAN: 111 mL/min/{1.73_m2} (ref >59)
GLOBULIN, TOTAL: 2.5 g/dL (ref 1.5–4.5)
Glucose: 99 mg/dL (ref 65–99)
Potassium: 4.5 mmol/L (ref 3.5–5.2)
SODIUM: 140 mmol/L (ref 134–144)
Total Protein: 6.6 g/dL (ref 6.0–8.5)

## 2014-05-21 LAB — THYROID PANEL WITH TSH
Free Thyroxine Index: 2.5 (ref 1.2–4.9)
T3 Uptake Ratio: 22 % — ABNORMAL LOW (ref 24–39)
T4, Total: 11.4 ug/dL (ref 4.5–12.0)
TSH: 1.29 u[IU]/mL (ref 0.450–4.500)

## 2014-05-21 LAB — VITAMIN D 25 HYDROXY (VIT D DEFICIENCY, FRACTURES): VIT D 25 HYDROXY: 30.6 ng/mL (ref 30.0–100.0)

## 2014-05-21 LAB — LIPID PANEL
CHOL/HDL RATIO: 3.9 ratio (ref 0.0–4.4)
CHOLESTEROL TOTAL: 198 mg/dL (ref 100–199)
HDL: 51 mg/dL (ref >39)
LDL CALC: 124 mg/dL — AB (ref 0–99)
Triglycerides: 116 mg/dL (ref 0–149)
VLDL Cholesterol Cal: 23 mg/dL (ref 5–40)

## 2014-05-25 ENCOUNTER — Other Ambulatory Visit: Payer: Self-pay | Admitting: Family

## 2014-05-25 LAB — PAP IG W/ RFLX HPV ASCU: PAP SMEAR COMMENT: 0

## 2014-12-04 ENCOUNTER — Other Ambulatory Visit: Payer: Self-pay | Admitting: Family

## 2014-12-06 NOTE — Telephone Encounter (Signed)
Last seen 05/20/14  Kristin Ballard  Last Vit D 05/20/14  30.6

## 2015-01-25 ENCOUNTER — Ambulatory Visit (INDEPENDENT_AMBULATORY_CARE_PROVIDER_SITE_OTHER): Payer: Medicaid Other

## 2015-01-25 ENCOUNTER — Other Ambulatory Visit: Payer: Self-pay | Admitting: Family

## 2015-01-25 DIAGNOSIS — Z23 Encounter for immunization: Secondary | ICD-10-CM | POA: Diagnosis not present

## 2015-01-25 MED ORDER — NORGESTIM-ETH ESTRAD TRIPHASIC 0.18/0.215/0.25 MG-25 MCG PO TABS: 1.0000 | ORAL_TABLET | Freq: Every day | ORAL | Status: DC

## 2016-05-30 ENCOUNTER — Encounter: Payer: Self-pay | Admitting: Family Medicine

## 2016-05-30 ENCOUNTER — Ambulatory Visit (INDEPENDENT_AMBULATORY_CARE_PROVIDER_SITE_OTHER): Payer: Medicaid Other | Admitting: Family Medicine

## 2016-05-30 VITALS — BP 116/88 | HR 91 | Temp 97.5°F | Ht 63.5 in | Wt 236.0 lb

## 2016-05-30 DIAGNOSIS — O874 Varicose veins of lower extremity in the puerperium: Secondary | ICD-10-CM

## 2016-05-30 DIAGNOSIS — O878 Other venous complications in the puerperium: Secondary | ICD-10-CM

## 2016-05-30 DIAGNOSIS — B353 Tinea pedis: Secondary | ICD-10-CM | POA: Diagnosis not present

## 2016-05-30 MED ORDER — TERBINAFINE HCL 250 MG PO TABS: 250.0000 mg | ORAL_TABLET | Freq: Every day | ORAL | 1 refills | Status: DC

## 2016-05-30 NOTE — Progress Notes (Signed)
   Subjective:    Patient ID: Kristin Ballard, female    DOB: 06/07/1985, 31 y.o.   MRN: [id number]  HPI Patient here today for rash and blisters on her left foot. This started a few months ago. She had tried Lamisil cream but thought that made the rash worse and stopped it after 2 days. There is some itching.  Also complains of pressure in her legs and feet. She notices this more after she has set with her feet down for a while. There is a family history of varicose veins. Swelling was worse when she was pregnant.    Patient Active Problem List   Diagnosis Date Noted  . Vitamin D deficiency 05/20/2014   Outpatient Encounter Prescriptions as of 05/30/2016  Medication Sig  . cetirizine (ZYRTEC) 10 MG tablet Take 10 mg by mouth daily.  Marland Kitchen. omeprazole (PRILOSEC) 20 MG capsule Take 20 mg by mouth daily.  . [DISCONTINUED] cyclobenzaprine (FLEXERIL) 10 MG tablet TAKE ONE TABLET BY MOUTH THREE TIMES DAILY AS NEEDED FOR MUSCLE SPASM  . [DISCONTINUED] Norgestimate-Ethinyl Estradiol Triphasic (TRI-LO-ESTARYLLA) 0.18/0.215/0.25 MG-25 MCG tab Take 1 tablet by mouth daily.  . [DISCONTINUED] Vitamin D, Ergocalciferol, (DRISDOL) 50000 UNITS CAPS capsule TAKE ONE CAPSULE BY MOUTH ONCE A WEEK   No facility-administered encounter medications on file as of 05/30/2016.       Review of Systems  Constitutional: Negative.   HENT: Negative.   Eyes: Negative.   Respiratory: Negative.   Cardiovascular: Negative.   Gastrointestinal: Negative.   Endocrine: Negative.   Genitourinary: Negative.   Musculoskeletal: Positive for myalgias (pressure on legs).  Skin: Positive for rash (left foot).  Allergic/Immunologic: Negative.   Neurological: Negative.   Hematological: Negative.   Psychiatric/Behavioral: Negative.        Objective:   Physical Exam  Constitutional: She appears well-developed and well-nourished.  Skin:  There is rash between her big toe and second and third toes on the left foot. There  are some scabs and blisters. Viscous consistent with tinea infection.   BP 116/88 (BP Location: Left Arm)   Pulse 91   Temp 97.5 F (36.4 C) (Oral)   Ht 5' 3.5" (1.613 m)   Wt 236 lb (107 kg)   LMP 05/23/2016   BMI 41.15 kg/m         Assessment & Plan:  1. Tinea pedis of left foot Will try terbinafine tablet 250 mg 1 daily for 1 week if rash doesn't resolve after 2 weeks repeat the course.   2. Varicose vein of leg, postpartum I have suggested for patient to elevate legs whenever possible. She eats a fair amount of sodium salt and I have also suggested to try to reduce that. Note that her diastolic pressure is elevated today but this wasn't a problem during pregnancy  Frederica KusterStephen M Miller MD

## 2016-06-14 ENCOUNTER — Other Ambulatory Visit: Payer: Self-pay | Admitting: Family Medicine

## 2016-08-28 ENCOUNTER — Ambulatory Visit: Payer: Medicaid Other | Admitting: Family

## 2016-08-29 ENCOUNTER — Encounter: Payer: Self-pay | Admitting: Family

## 2016-08-29 ENCOUNTER — Ambulatory Visit (INDEPENDENT_AMBULATORY_CARE_PROVIDER_SITE_OTHER): Payer: Medicaid Other | Admitting: Family

## 2016-08-29 VITALS — BP 130/84 | HR 69 | Temp 97.6°F | Ht 63.5 in | Wt 233.2 lb

## 2016-08-29 DIAGNOSIS — F17200 Nicotine dependence, unspecified, uncomplicated: Secondary | ICD-10-CM

## 2016-08-29 DIAGNOSIS — B351 Tinea unguium: Secondary | ICD-10-CM | POA: Diagnosis not present

## 2016-08-29 DIAGNOSIS — B353 Tinea pedis: Secondary | ICD-10-CM

## 2016-08-29 MED ORDER — TERBINAFINE HCL 250 MG PO TABS: 250.0000 mg | ORAL_TABLET | Freq: Every day | ORAL | 1 refills | Status: DC

## 2016-08-29 NOTE — Progress Notes (Signed)
   Subjective:    Patient ID: Kristin Ballard, female    DOB: [birth year], 31 y.o.   MRN: [id number]  Pt presents to the office for recurrent tinea pedis. PT was seen in the office on 05/30/16 and given terbinafine 250 mg daily for one week and then repeated it two weeks later. PT states the rash improved and the itching is gone, but it continues to be dry. She reports her toenail looks yellow and thick on her second toenail.  Rash  This is a new problem. The current episode started in the past 7 days. The problem has been waxing and waning since onset. The affected locations include the left foot. The rash is characterized by dryness, itchiness and redness. She was exposed to nothing. Pertinent negatives include no congestion, cough, fever or sore throat. Past treatments include moisturizer.      Review of Systems  Constitutional: Negative for fever.  HENT: Negative for congestion and sore throat.   Respiratory: Negative for cough.   Skin: Positive for rash.  All other systems reviewed and are negative.      Objective:   Physical Exam  Constitutional: She is oriented to person, place, and time. She appears well-developed and well-nourished. No distress.  HENT:  Head: Normocephalic.  Cardiovascular: Normal rate, regular rhythm, normal heart sounds and intact distal pulses.   No murmur heard. Pulmonary/Chest: Effort normal and breath sounds normal. No respiratory distress. She has no wheezes.  Musculoskeletal: Normal range of motion. She exhibits no edema or tenderness.  Neurological: She is alert and oriented to person, place, and time.  Skin: Skin is warm and dry. Rash noted.  Dry cracked rash between great toe and second and third. Second toenail thick and yellow.   Psychiatric: She has a normal mood and affect. Her behavior is normal. Judgment and thought content normal.  Vitals reviewed.     BP 130/84   Pulse 69   Temp 97.6 F (36.4 C) (Oral)   Ht 5' 3.5" (1.613 m)   Wt  233 lb 3.2 oz (105.8 kg)   BMI 40.66 kg/m      Assessment & Plan:  1. Onychomycosis Do not share nail clippers  - terbinafine (LAMISIL) 250 MG tablet; Take 1 tablet (250 mg total) by mouth daily.  Dispense: 90 tablet; Refill: 1 - CMP14+EGFR  2. Tinea pedis of left foot -Do not scratch - terbinafine (LAMISIL) 250 MG tablet; Take 1 tablet (250 mg total) by mouth daily.  Dispense: 90 tablet; Refill: 1 - CMP14+EGFR  3. Current smoker - CMP14+EGFR   Evelina Dun, FNP

## 2016-08-29 NOTE — Patient Instructions (Signed)

## 2016-08-30 LAB — CMP14+EGFR
A/G RATIO: 1.4 (ref 1.2–2.2)
ALBUMIN: 4 g/dL (ref 3.5–5.5)
ALK PHOS: 91 IU/L (ref 39–117)
ALT: 20 IU/L (ref 0–32)
AST: 14 IU/L (ref 0–40)
BILIRUBIN TOTAL: 0.3 mg/dL (ref 0.0–1.2)
BUN / CREAT RATIO: 9 (ref 9–23)
BUN: 8 mg/dL (ref 6–20)
CHLORIDE: 103 mmol/L (ref 96–106)
CO2: 25 mmol/L (ref 18–29)
Calcium: 9.2 mg/dL (ref 8.7–10.2)
Creatinine, Ser: 0.85 mg/dL (ref 0.57–1.00)
GFR calc non Af Amer: 90 mL/min/{1.73_m2} (ref >59)
GFR, EST AFRICAN AMERICAN: 104 mL/min/{1.73_m2} (ref >59)
GLOBULIN, TOTAL: 2.8 g/dL (ref 1.5–4.5)
Glucose: 94 mg/dL (ref 65–99)
Potassium: 4.2 mmol/L (ref 3.5–5.2)
SODIUM: 137 mmol/L (ref 134–144)
Total Protein: 6.8 g/dL (ref 6.0–8.5)

## 2017-02-08 ENCOUNTER — Other Ambulatory Visit: Payer: Self-pay | Admitting: Family

## 2017-02-08 DIAGNOSIS — B353 Tinea pedis: Secondary | ICD-10-CM

## 2017-02-08 DIAGNOSIS — B351 Tinea unguium: Secondary | ICD-10-CM

## 2017-07-05 ENCOUNTER — Encounter: Payer: Self-pay | Admitting: Family

## 2017-07-05 ENCOUNTER — Ambulatory Visit (INDEPENDENT_AMBULATORY_CARE_PROVIDER_SITE_OTHER): Payer: Medicaid Other | Admitting: Family

## 2017-07-05 VITALS — BP 110/69 | HR 80 | Temp 97.1°F | Ht 63.5 in | Wt 235.8 lb

## 2017-07-05 DIAGNOSIS — Z0000 Encounter for general adult medical examination without abnormal findings: Secondary | ICD-10-CM | POA: Diagnosis not present

## 2017-07-05 DIAGNOSIS — F17200 Nicotine dependence, unspecified, uncomplicated: Secondary | ICD-10-CM

## 2017-07-05 DIAGNOSIS — K219 Gastro-esophageal reflux disease without esophagitis: Secondary | ICD-10-CM

## 2017-07-05 DIAGNOSIS — Z01419 Encounter for gynecological examination (general) (routine) without abnormal findings: Secondary | ICD-10-CM

## 2017-07-05 MED ORDER — OMEPRAZOLE 20 MG PO CPDR: 20.0000 mg | DELAYED_RELEASE_CAPSULE | Freq: Every day | ORAL | 1 refills | Status: DC

## 2017-07-05 MED ORDER — MICONAZOLE NITRATE 2 % EX OINT: 1.0000 "application " | TOPICAL_OINTMENT | Freq: Two times a day (BID) | CUTANEOUS | 0 refills | Status: DC

## 2017-07-05 MED ORDER — CETIRIZINE HCL 10 MG PO TABS: 10.0000 mg | ORAL_TABLET | Freq: Every day | ORAL | 1 refills | Status: DC

## 2017-07-05 NOTE — Patient Instructions (Signed)

## 2017-07-05 NOTE — Progress Notes (Signed)
   Subjective:    Patient ID: Kristin Ballard, female    DOB: [birth year], 32 y.o.   MRN: [id number]  PT presents to the office today for CPE with pap.  Gastroesophageal Reflux  She complains of belching, heartburn and nausea. She reports no coughing. This is a chronic problem. The current episode started more than 1 year ago. The problem occurs occasionally. The problem has been waxing and waning. The symptoms are aggravated by certain foods. She has tried an antacid for the symptoms. The treatment provided mild relief.  Gynecologic Exam  The patient's primary symptoms include vaginal discharge. The patient's pertinent negatives include no genital lesions or genital odor. This is a chronic problem. The current episode started more than 1 year ago. The patient is experiencing no pain. Associated symptoms include nausea. The vaginal discharge was clear.      Review of Systems  Respiratory: Negative for cough.   Gastrointestinal: Positive for heartburn and nausea.  Genitourinary: Positive for vaginal discharge.  All other systems reviewed and are negative.      Objective:   Physical Exam  Constitutional: She is oriented to person, place, and time. She appears well-developed and well-nourished. No distress.  HENT:  Head: Normocephalic and atraumatic.  Right Ear: External ear normal.  Left Ear: External ear normal.  Nose: Nose normal.  Mouth/Throat: Oropharynx is clear and moist.  Eyes: Pupils are equal, round, and reactive to light.  Neck: Normal range of motion. Neck supple. No thyromegaly present.  Cardiovascular: Normal rate, regular rhythm, normal heart sounds and intact distal pulses.  No murmur heard. Pulmonary/Chest: Effort normal and breath sounds normal. No respiratory distress. She has no wheezes. Right breast exhibits no inverted nipple, no mass, no nipple discharge, no skin change and no tenderness. Left breast exhibits no inverted nipple, no mass, no nipple discharge, no  skin change and no tenderness. Breasts are symmetrical.  Abdominal: Soft. Bowel sounds are normal. She exhibits no distension. There is no tenderness.  Genitourinary: Vagina normal.  Genitourinary Comments: Bimanual exam- no adnexal masses or tenderness, ovaries nonpalpable   Cervix parous and pink- No discharge  Strings seen and intact   Musculoskeletal: Normal range of motion. She exhibits no edema or tenderness.  Neurological: She is alert and oriented to person, place, and time.  Skin: Skin is warm and dry.  Psychiatric: She has a normal mood and affect. Her behavior is normal. Judgment and thought content normal.  Vitals reviewed.     BP 110/69   Pulse 80   Temp (!) 97.1 F (36.2 C) (Oral)   Ht 5' 3.5" (1.613 m)   Wt 235 lb 12.8 oz (107 kg)   BMI 41.11 kg/m      Assessment & Plan:  1. Annual physical exam - Anemia Profile B - CMP14+EGFR - Lipid panel - TSH - VITAMIN D 25 Hydroxy (Vit-D Deficiency, Fractures) - HIV antibody - IGP, Aptima HPV, rfx 16/18,45  2. Encounter for gynecological examination without abnormal finding - CMP14+EGFR - IGP, Aptima HPV, rfx 16/18,45  3. Current smoker - CMP14+EGFR  4. Morbid obesity ([place]) - CMP14+EGFR  5. Gastroesophageal reflux disease, esophagitis presence not specified - CMP14+EGFR - omeprazole (PRILOSEC) 20 MG capsule; Take 1 capsule (20 mg total) by mouth daily.  Dispense: 90 capsule; Refill: 1   Continue all meds Labs pending Health Maintenance reviewed Diet and exercise encouraged RTO 1 year  Evelina Dun, FNP

## 2017-07-06 LAB — ANEMIA PROFILE B
Basophils Absolute: 0 10*3/uL (ref 0.0–0.2)
Basos: 0 %
EOS (ABSOLUTE): 0.1 10*3/uL (ref 0.0–0.4)
Eos: 2 %
FOLATE: 8.7 ng/mL (ref >3.0)
Ferritin: 31 ng/mL (ref 15–150)
Hematocrit: 43.5 % (ref 34.0–46.6)
Hemoglobin: 14.8 g/dL (ref 11.1–15.9)
IMMATURE GRANULOCYTES: 0 %
IRON: 97 ug/dL (ref 27–159)
Immature Grans (Abs): 0 10*3/uL (ref 0.0–0.1)
Iron Saturation: 25 % (ref 15–55)
Lymphocytes Absolute: 2.4 10*3/uL (ref 0.7–3.1)
Lymphs: 38 %
MCH: 31.1 pg (ref 26.6–33.0)
MCHC: 34 g/dL (ref 31.5–35.7)
MCV: 91 fL (ref 79–97)
MONOCYTES: 6 %
Monocytes Absolute: 0.4 10*3/uL (ref 0.1–0.9)
NEUTROS PCT: 54 %
Neutrophils Absolute: 3.4 10*3/uL (ref 1.4–7.0)
Platelets: 290 10*3/uL (ref 150–379)
RBC: 4.76 x10E6/uL (ref 3.77–5.28)
RDW: 13.7 % (ref 12.3–15.4)
RETIC CT PCT: 1 % (ref 0.6–2.6)
TIBC: 395 ug/dL (ref 250–450)
UIBC: 298 ug/dL (ref 131–425)
Vitamin B-12: 848 pg/mL (ref 232–1245)
WBC: 6.4 10*3/uL (ref 3.4–10.8)

## 2017-07-06 LAB — CMP14+EGFR
A/G RATIO: 1.6 (ref 1.2–2.2)
ALK PHOS: 90 IU/L (ref 39–117)
ALT: 19 IU/L (ref 0–32)
AST: 19 IU/L (ref 0–40)
Albumin: 4.5 g/dL (ref 3.5–5.5)
BUN/Creatinine Ratio: 7 — ABNORMAL LOW (ref 9–23)
BUN: 8 mg/dL (ref 6–20)
Bilirubin Total: 0.4 mg/dL (ref 0.0–1.2)
CO2: 19 mmol/L — ABNORMAL LOW (ref 20–29)
Calcium: 9.4 mg/dL (ref 8.7–10.2)
Chloride: 107 mmol/L — ABNORMAL HIGH (ref 96–106)
Creatinine, Ser: 1.08 mg/dL — ABNORMAL HIGH (ref 0.57–1.00)
GFR calc non Af Amer: 67 mL/min/{1.73_m2} (ref >59)
GFR, EST AFRICAN AMERICAN: 77 mL/min/{1.73_m2} (ref >59)
GLUCOSE: 97 mg/dL (ref 65–99)
Globulin, Total: 2.9 g/dL (ref 1.5–4.5)
POTASSIUM: 4.2 mmol/L (ref 3.5–5.2)
Sodium: 141 mmol/L (ref 134–144)
TOTAL PROTEIN: 7.4 g/dL (ref 6.0–8.5)

## 2017-07-06 LAB — LIPID PANEL
CHOLESTEROL TOTAL: 162 mg/dL (ref 100–199)
Chol/HDL Ratio: 3 ratio (ref 0.0–4.4)
HDL: 54 mg/dL (ref >39)
LDL Calculated: 84 mg/dL (ref 0–99)
TRIGLYCERIDES: 120 mg/dL (ref 0–149)
VLDL CHOLESTEROL CAL: 24 mg/dL (ref 5–40)

## 2017-07-06 LAB — HIV ANTIBODY (ROUTINE TESTING W REFLEX): HIV Screen 4th Generation wRfx: NONREACTIVE

## 2017-07-06 LAB — VITAMIN D 25 HYDROXY (VIT D DEFICIENCY, FRACTURES): Vit D, 25-Hydroxy: 11.1 ng/mL — ABNORMAL LOW (ref 30.0–100.0)

## 2017-07-06 LAB — TSH: TSH: 1.1 u[IU]/mL (ref 0.450–4.500)

## 2017-07-09 LAB — IGP, APTIMA HPV, RFX 16/18,45
HPV APTIMA: NEGATIVE
PAP Smear Comment: 0

## 2017-07-11 ENCOUNTER — Other Ambulatory Visit: Payer: Self-pay | Admitting: Family

## 2017-07-11 MED ORDER — VITAMIN D (ERGOCALCIFEROL) 1.25 MG (50000 UNIT) PO CAPS: 50000.0000 [IU] | ORAL_CAPSULE | ORAL | 3 refills | Status: DC

## 2018-04-15 ENCOUNTER — Encounter: Payer: Self-pay | Admitting: Family Medicine

## 2018-04-15 ENCOUNTER — Ambulatory Visit: Payer: Medicaid Other | Admitting: Family Medicine

## 2018-04-15 VITALS — BP 133/88 | HR 96 | Temp 98.9°F | Ht 64.0 in | Wt 240.0 lb

## 2018-04-15 DIAGNOSIS — J029 Acute pharyngitis, unspecified: Secondary | ICD-10-CM | POA: Diagnosis not present

## 2018-04-15 DIAGNOSIS — J020 Streptococcal pharyngitis: Secondary | ICD-10-CM

## 2018-04-15 LAB — RAPID STREP SCREEN (MED CTR MEBANE ONLY): STREP GP A AG, IA W/REFLEX: POSITIVE — AB

## 2018-04-15 MED ORDER — AMOXICILLIN 875 MG PO TABS: 875.0000 mg | ORAL_TABLET | Freq: Two times a day (BID) | ORAL | 0 refills | Status: AC

## 2018-04-15 NOTE — Progress Notes (Signed)
Chief Complaint  Patient presents with  . Sore Throat    HPI  Patient presents today for sore throat for 2 days of increasing throat pain. Difficulty swallowing. Tender swelling in the neck to.  PMH: Smoking status noted ROS: Per HPI  Objective: BP 133/88   Pulse 96   Temp 98.9 F (37.2 C) (Oral)   Ht 5\' 4"  (1.626 m)   Wt 240 lb (108.9 kg)   BMI 41.20 kg/m  Gen: NAD, alert, cooperative with exam HEENT: NCAT, EOMI, PERRL. Pharynx red with exudate 2+ on tonsils. 2+ anterior cervical nodes. CV: RRR, good S1/S2, no murmur Resp: CTABL, no wheezes, non-labored Abd: SNTND, BS present, no guarding or organomegaly Ext: No edema, warm Neuro: Alert and oriented, No gross deficits  Assessment and plan:  1. Strep pharyngitis   2. Sore throat     Meds ordered this encounter  Medications  . amoxicillin (AMOXIL) 875 MG tablet    Sig: Take 1 tablet (875 mg total) by mouth 2 (two) times daily for 10 days.    Dispense:  20 tablet    Refill:  0    Orders Placed This Encounter  Procedures  . Rapid Strep Screen ([facility] ONLY)    Follow up as needed.  Mechele Claude, MD

## 2018-05-01 ENCOUNTER — Ambulatory Visit: Payer: Medicaid Other | Admitting: Family Medicine

## 2018-05-01 ENCOUNTER — Encounter: Payer: Self-pay | Admitting: Family Medicine

## 2018-05-01 VITALS — BP 131/81 | HR 77 | Temp 98.7°F | Ht 64.0 in | Wt 242.0 lb

## 2018-05-01 DIAGNOSIS — J020 Streptococcal pharyngitis: Secondary | ICD-10-CM

## 2018-05-01 DIAGNOSIS — J029 Acute pharyngitis, unspecified: Secondary | ICD-10-CM | POA: Diagnosis not present

## 2018-05-01 LAB — RAPID STREP SCREEN (MED CTR MEBANE ONLY): STREP GP A AG, IA W/REFLEX: POSITIVE — AB

## 2018-05-01 MED ORDER — PENICILLIN V POTASSIUM 500 MG PO TABS: 500.0000 mg | ORAL_TABLET | Freq: Three times a day (TID) | ORAL | 0 refills | Status: AC

## 2018-05-01 NOTE — Progress Notes (Signed)
    Kristin Ballard is a [age] y.o. female presenting with a sore throat for 2 days. She was diagnosed with strep pharyngitis on 04/15/2018 and placed on a 10 day course of amoxicillin. She completed this on Saturday and felt better until 2 days ago. She states she has had fever, chills, and sore throat.   Associated symptoms include:  fever and chills.  Symptoms are constant.  Home treatment thus far includes:  NSAIDS/acetaminophen, lozenges and OTC sore throat / cold products.  No known sick contacts with similar symptoms.  There is a previous history of of similar symptoms.  Exam:  BP 131/81   Pulse 77   Temp 98.7 F (37.1 C) (Oral)   Ht 5\' 4"  (1.626 m)   Wt 242 lb (109.8 kg)   BMI 41.54 kg/m  Physical Exam  Constitutional: She is oriented to person, place, and time. She appears well-developed and well-nourished. She is cooperative. She appears distressed (mild).  HENT:  Head: Normocephalic and atraumatic.  Right Ear: External ear normal.  Left Ear: External ear normal.  Mouth/Throat: Uvula is midline and mucous membranes are normal. Oropharyngeal exudate, posterior oropharyngeal edema and posterior oropharyngeal erythema present. No tonsillar abscesses.  Eyes: Pupils are equal, round, and reactive to light. Conjunctivae are normal.  Neck: Normal range of motion. Neck supple.  Cardiovascular: Normal rate, regular rhythm and normal heart sounds.  Pulmonary/Chest: Effort normal and breath sounds normal.  Lymphadenopathy:    She has cervical adenopathy (posterior).  Neurological: She is alert and oriented to person, place, and time.  Skin: Skin is warm and dry.  Psychiatric: She has a normal mood and affect. Her behavior is normal. Judgment and thought content normal.   Rapid Strep positive in office.  [name] was seen today for sore throat.  Diagnoses and all orders for this visit:  Sore throat -     Rapid Strep Screen ([facility] ONLY)  Strep sore throat Tylenol as  needed for fever and pain control. Can continue chloraseptic spray and lozenges. If recurrent, may need referral to ENT. Medications as prescribed.  -     penicillin v potassium (VEETID) 500 MG tablet; Take 1 tablet (500 mg total) by mouth 3 (three) times daily for 10 days.   The above assessment and management plan was discussed with the patient. The patient verbalized understanding of and has agreed to the management plan. Patient is aware to call the clinic if symptoms fail to improve or worsen. Patient is aware when to return to the clinic for a follow-up visit. Patient educated on when it is appropriate to go to the emergency department.   Kari BaarsMichelle Freedom Peddy, FNP-C [facility] [address] [place], [place] [id number] [phone]

## 2018-05-01 NOTE — Patient Instructions (Signed)
Strep Throat    Strep throat is a bacterial infection of the throat. Your health care provider may call the infection tonsillitis or pharyngitis, depending on whether there is swelling in the tonsils or at the back of the throat. Strep throat is most common during the cold months of the year in children who are 5-35 years of age, but it can happen during any season in people of any age. This infection is spread from person to person (contagious) through coughing, sneezing, or close contact.  What are the causes?  Strep throat is caused by the bacteria called Streptococcus pyogenes.  What increases the risk?  This condition is more likely to develop in:  · People who spend time in crowded places where the infection can spread easily.  · People who have close contact with someone who has strep throat.  What are the signs or symptoms?  Symptoms of this condition include:  · Fever or chills.  · Redness, swelling, or pain in the tonsils or throat.  · Pain or difficulty when swallowing.  · White or yellow spots on the tonsils or throat.  · Swollen, tender glands in the neck or under the jaw.  · Red rash all over the body (rare).  How is this diagnosed?  This condition is diagnosed by performing a rapid strep test or by taking a swab of your throat (throat culture test). Results from a rapid strep test are usually ready in a few minutes, but throat culture test results are available after one or two days.  How is this treated?  This condition is treated with antibiotic medicine.  Follow these instructions at home:  Medicines  · Take over-the-counter and prescription medicines only as told by your health care provider.  · Take your antibiotic as told by your health care provider. Do not stop taking the antibiotic even if you start to feel better.  · Have family members who also have a sore throat or fever tested for strep throat. They may need antibiotics if they have the strep infection.  Eating and drinking  · Do not  share food, drinking cups, or personal items that could cause the infection to spread to other people.  · If swallowing is difficult, try eating soft foods until your sore throat feels better.  · Drink enough fluid to keep your urine clear or pale yellow.  General instructions  · Gargle with a salt-water mixture 3-4 times per day or as needed. To make a salt-water mixture, completely dissolve ½-1 tsp of salt in 1 cup of warm water.  · Make sure that all household members wash their hands well.  · Get plenty of rest.  · Stay home from school or work until you have been taking antibiotics for 24 hours.  · Keep all follow-up visits as told by your health care provider. This is important.  Contact a health care provider if:  · The glands in your neck continue to get bigger.  · You develop a rash, cough, or earache.  · You cough up a thick liquid that is green, yellow-brown, or bloody.  · You have pain or discomfort that does not get better with medicine.  · Your problems seem to be getting worse rather than better.  · You have a fever.  Get help right away if:  · You have new symptoms, such as vomiting, severe headache, stiff or painful neck, chest pain, or shortness of breath.  · You have severe throat   pain, drooling, or changes in your voice.  · You have swelling of the neck, or the skin on the neck becomes red and tender.  · You have signs of dehydration, such as fatigue, dry mouth, and decreased urination.  · You become increasingly sleepy, or you cannot wake up completely.  · Your joints become red or painful.  This information is not intended to replace advice given to you by your health care provider. Make sure you discuss any questions you have with your health care provider.  Document Released: 03/23/2000 Document Revised: 11/23/2015 Document Reviewed: 07/19/2014  Elsevier Interactive Patient Education © 2019 [employer] Inc[employer]

## 2018-05-29 ENCOUNTER — Ambulatory Visit (INDEPENDENT_AMBULATORY_CARE_PROVIDER_SITE_OTHER): Payer: Medicaid Other | Admitting: *Deleted

## 2018-05-29 DIAGNOSIS — Z23 Encounter for immunization: Secondary | ICD-10-CM

## 2018-07-07 ENCOUNTER — Encounter: Payer: Medicaid Other | Admitting: Family

## 2018-07-22 ENCOUNTER — Other Ambulatory Visit: Payer: Self-pay | Admitting: Family

## 2018-07-22 DIAGNOSIS — K219 Gastro-esophageal reflux disease without esophagitis: Secondary | ICD-10-CM

## 2018-09-12 ENCOUNTER — Telehealth: Payer: Self-pay | Admitting: Family

## 2018-09-12 ENCOUNTER — Other Ambulatory Visit: Payer: Self-pay

## 2018-09-12 NOTE — Telephone Encounter (Signed)
These are chronic problems Pt ok to come in for pt

## 2018-09-15 ENCOUNTER — Encounter: Payer: Self-pay | Admitting: Family

## 2018-09-15 ENCOUNTER — Other Ambulatory Visit: Payer: Self-pay

## 2018-09-15 ENCOUNTER — Ambulatory Visit (INDEPENDENT_AMBULATORY_CARE_PROVIDER_SITE_OTHER): Payer: Medicaid Other | Admitting: Family

## 2018-09-15 VITALS — BP 117/81 | HR 84 | Temp 98.3°F | Ht 64.0 in | Wt 151.2 lb

## 2018-09-15 DIAGNOSIS — Z01419 Encounter for gynecological examination (general) (routine) without abnormal findings: Secondary | ICD-10-CM | POA: Diagnosis not present

## 2018-09-15 DIAGNOSIS — F17200 Nicotine dependence, unspecified, uncomplicated: Secondary | ICD-10-CM

## 2018-09-15 DIAGNOSIS — E559 Vitamin D deficiency, unspecified: Secondary | ICD-10-CM

## 2018-09-15 DIAGNOSIS — Z0000 Encounter for general adult medical examination without abnormal findings: Secondary | ICD-10-CM

## 2018-09-15 DIAGNOSIS — Z0001 Encounter for general adult medical examination with abnormal findings: Secondary | ICD-10-CM

## 2018-09-15 DIAGNOSIS — Z01411 Encounter for gynecological examination (general) (routine) with abnormal findings: Secondary | ICD-10-CM

## 2018-09-15 DIAGNOSIS — K219 Gastro-esophageal reflux disease without esophagitis: Secondary | ICD-10-CM | POA: Diagnosis not present

## 2018-09-15 MED ORDER — OMEPRAZOLE 20 MG PO CPDR: 20.0000 mg | DELAYED_RELEASE_CAPSULE | Freq: Every day | ORAL | 4 refills | Status: DC

## 2018-09-15 MED ORDER — CETIRIZINE HCL 10 MG PO TABS: 10.0000 mg | ORAL_TABLET | Freq: Every day | ORAL | 4 refills | Status: DC

## 2018-09-15 NOTE — Patient Instructions (Signed)

## 2018-09-15 NOTE — Progress Notes (Signed)
Subjective:    Patient ID: Kristin Ballard, female    DOB: Kristin Ballard, 33 y.o.   MRN: [id number]  Chief Complaint  Patient presents with  . Annual Exam   PT presents to the office today for CPE with pap.  Gastroesophageal Reflux  She complains of belching, choking, coughing and heartburn. This is a chronic problem. The current episode started more than 1 year ago. The problem occurs occasionally. The problem has been waxing and waning. Risk factors include obesity. She has tried a PPI and an antacid for the symptoms. The treatment provided moderate relief.  Gynecologic Exam  The patient's primary symptoms include vaginal discharge. The patient's pertinent negatives include no genital itching, genital odor or vaginal bleeding. The patient is experiencing no pain. Pertinent negatives include no painful intercourse. The vaginal discharge was clear. She uses an IUD for contraception.      Review of Systems  Respiratory: Positive for cough and choking.   Gastrointestinal: Positive for heartburn.  Genitourinary: Positive for vaginal discharge.  All other systems reviewed and are negative.   Family History  Problem Relation Age of Onset  . Depression Mother   . Diabetes Father   . Cancer Father        melanoma   Social History   Socioeconomic History  . Marital status: Unknown    Spouse name: Not on file  . Number of children: Not on file  . Years of education: Not on file  . Highest education level: Not on file  Occupational History  . Not on file  Social Needs  . Financial resource strain: Not on file  . Food insecurity:    Worry: Not on file    Inability: Not on file  . Transportation needs:    Medical: Not on file    Non-medical: Not on file  Tobacco Use  . Smoking status: Heavy Tobacco Smoker    Packs/day: 0.50  . Smokeless tobacco: Never Used  Substance and Sexual Activity  . Alcohol use: No  . Drug use: Yes    Types: Marijuana  . Sexual activity: Yes   Birth control/protection: Pill  Lifestyle  . Physical activity:    Days per week: Not on file    Minutes per session: Not on file  . Stress: Not on file  Relationships  . Social connections:    Talks on phone: Not on file    Gets together: Not on file    Attends religious service: Not on file    Active member of club or organization: Not on file    Attends meetings of clubs or organizations: Not on file    Relationship status: Not on file  Other Topics Concern  . Not on file  Social History Narrative  . Not on file       Objective:   Physical Exam Vitals signs reviewed.  Constitutional:      General: She is not in acute distress.    Appearance: She is well-developed.  HENT:     Head: Normocephalic and atraumatic.     Right Ear: Tympanic membrane normal.     Left Ear: Tympanic membrane normal.  Eyes:     Pupils: Pupils are equal, round, and reactive to light.  Neck:     Musculoskeletal: Normal range of motion and neck supple.     Thyroid: No thyromegaly.  Cardiovascular:     Rate and Rhythm: Normal rate and regular rhythm.     Heart sounds: Normal heart  sounds. No murmur.  Pulmonary:     Effort: Pulmonary effort is normal. No respiratory distress.     Breath sounds: Normal breath sounds. No wheezing.  Chest:     Breasts:        Right: No swelling, bleeding, inverted nipple, mass, nipple discharge, skin change or tenderness.        Left: No swelling, bleeding, inverted nipple, mass, nipple discharge, skin change or tenderness.  Abdominal:     General: Bowel sounds are normal. There is no distension.     Palpations: Abdomen is soft.     Tenderness: There is no abdominal tenderness.  Genitourinary:    General: Normal vulva.     Comments: Bimanual exam- no adnexal masses or tenderness, ovaries nonpalpable   Cervix parous and pink- No discharge  Musculoskeletal: Normal range of motion.        General: No tenderness.  Skin:    General: Skin is warm and dry.   Neurological:     Mental Status: She is alert and oriented to person, place, and time.     Cranial Nerves: No cranial nerve deficit.     Deep Tendon Reflexes: Reflexes are normal and symmetric.  Psychiatric:        Behavior: Behavior normal.        Thought Content: Thought content normal.        Judgment: Judgment normal.       BP 117/81   Pulse 84   Temp 98.3 F (36.8 C) (Oral)   Ht 5' 4"  (1.626 m)   Wt 151 lb 3.2 oz (68.6 kg)   BMI 25.95 kg/m      Assessment & Plan:  Kristin Ballard comes in today with chief complaint of Annual Exam   Diagnosis and orders addressed:  1. Annual physical exam - CMP14+EGFR - CBC with Differential/Platelet - Lipid panel - TSH - VITAMIN D 25 Hydroxy (Vit-D Deficiency, Fractures) - IGP, Aptima HPV, rfx 16/18,45  2. Gastroesophageal reflux disease, esophagitis presence not specified - omeprazole (PRILOSEC) 20 MG capsule; Take 1 capsule (20 mg total) by mouth daily.  Dispense: 90 capsule; Refill: 4 - CMP14+EGFR - CBC with Differential/Platelet  3. Morbid obesity (Kristin Ballard) - CMP14+EGFR - CBC with Differential/Platelet  4. Current smoker - CMP14+EGFR - CBC with Differential/Platelet  5. Vitamin D deficiency - CMP14+EGFR - CBC with Differential/Platelet - VITAMIN D 25 Hydroxy (Vit-D Deficiency, Fractures)  6. Encounter for gynecological examination without abnormal finding - CMP14+EGFR - CBC with Differential/Platelet - IGP, Aptima HPV, rfx 16/18,45   Labs pending Health Maintenance reviewed Diet and exercise encouraged  Follow up plan: 1 year    Kristin Dun, FNP

## 2018-09-16 ENCOUNTER — Other Ambulatory Visit: Payer: Self-pay | Admitting: Family

## 2018-09-16 LAB — CBC WITH DIFFERENTIAL/PLATELET
Basophils Absolute: 0 10*3/uL (ref 0.0–0.2)
Basos: 1 %
EOS (ABSOLUTE): 0.1 10*3/uL (ref 0.0–0.4)
Eos: 1 %
Hematocrit: 41.5 % (ref 34.0–46.6)
Hemoglobin: 14 g/dL (ref 11.1–15.9)
Immature Grans (Abs): 0 10*3/uL (ref 0.0–0.1)
Immature Granulocytes: 0 %
Lymphocytes Absolute: 2.8 10*3/uL (ref 0.7–3.1)
Lymphs: 37 %
MCH: 33.1 pg — ABNORMAL HIGH (ref 26.6–33.0)
MCHC: 33.7 g/dL (ref 31.5–35.7)
MCV: 98 fL — ABNORMAL HIGH (ref 79–97)
Monocytes Absolute: 0.4 10*3/uL (ref 0.1–0.9)
Monocytes: 5 %
Neutrophils Absolute: 4.3 10*3/uL (ref 1.4–7.0)
Neutrophils: 56 %
Platelets: 254 10*3/uL (ref 150–450)
RBC: 4.23 x10E6/uL (ref 3.77–5.28)
RDW: 12.6 % (ref 11.7–15.4)
WBC: 7.7 10*3/uL (ref 3.4–10.8)

## 2018-09-16 LAB — CMP14+EGFR
ALT: 20 IU/L (ref 0–32)
AST: 25 IU/L (ref 0–40)
Albumin/Globulin Ratio: 1.8 (ref 1.2–2.2)
Albumin: 4.4 g/dL (ref 3.8–4.8)
Alkaline Phosphatase: 75 IU/L (ref 39–117)
BUN/Creatinine Ratio: 8 — ABNORMAL LOW (ref 9–23)
BUN: 7 mg/dL (ref 6–20)
Bilirubin Total: 0.2 mg/dL (ref 0.0–1.2)
CO2: 22 mmol/L (ref 20–29)
Calcium: 9.1 mg/dL (ref 8.7–10.2)
Chloride: 103 mmol/L (ref 96–106)
Creatinine, Ser: 0.83 mg/dL (ref 0.57–1.00)
GFR calc Af Amer: 106 mL/min/{1.73_m2} (ref >59)
GFR calc non Af Amer: 92 mL/min/{1.73_m2} (ref >59)
Globulin, Total: 2.4 g/dL (ref 1.5–4.5)
Glucose: 93 mg/dL (ref 65–99)
Potassium: 4.1 mmol/L (ref 3.5–5.2)
Sodium: 138 mmol/L (ref 134–144)
Total Protein: 6.8 g/dL (ref 6.0–8.5)

## 2018-09-16 LAB — LIPID PANEL
Chol/HDL Ratio: 3.9 ratio (ref 0.0–4.4)
Cholesterol, Total: 197 mg/dL (ref 100–199)
HDL: 50 mg/dL (ref >39)
LDL Calculated: 100 mg/dL — ABNORMAL HIGH (ref 0–99)
Triglycerides: 233 mg/dL — ABNORMAL HIGH (ref 0–149)
VLDL Cholesterol Cal: 47 mg/dL — ABNORMAL HIGH (ref 5–40)

## 2018-09-16 LAB — VITAMIN D 25 HYDROXY (VIT D DEFICIENCY, FRACTURES): Vit D, 25-Hydroxy: 15.9 ng/mL — ABNORMAL LOW (ref 30.0–100.0)

## 2018-09-16 LAB — TSH: TSH: 2.06 u[IU]/mL (ref 0.450–4.500)

## 2018-09-16 MED ORDER — VITAMIN D (ERGOCALCIFEROL) 1.25 MG (50000 UNIT) PO CAPS: 50000.0000 [IU] | ORAL_CAPSULE | ORAL | 3 refills | Status: DC

## 2018-09-18 LAB — IGP, APTIMA HPV, RFX 16/18,45: HPV Aptima: NEGATIVE

## 2019-03-03 ENCOUNTER — Other Ambulatory Visit: Payer: Self-pay

## 2019-03-03 DIAGNOSIS — Z20822 Contact with and (suspected) exposure to covid-19: Secondary | ICD-10-CM

## 2019-03-04 LAB — NOVEL CORONAVIRUS, NAA: SARS-CoV-2, NAA: NOT DETECTED

## 2019-04-13 ENCOUNTER — Ambulatory Visit (INDEPENDENT_AMBULATORY_CARE_PROVIDER_SITE_OTHER): Payer: Medicaid Other | Admitting: Nurse Practitioner

## 2019-04-13 DIAGNOSIS — J029 Acute pharyngitis, unspecified: Secondary | ICD-10-CM

## 2019-04-13 MED ORDER — AMOXICILLIN 875 MG PO TABS: 875.0000 mg | ORAL_TABLET | Freq: Two times a day (BID) | ORAL | 0 refills | Status: DC

## 2019-04-13 NOTE — Progress Notes (Signed)
   Virtual Visit via telephone Note Due to COVID-19 pandemic this visit was conducted virtually. This visit type was conducted due to national recommendations for restrictions regarding the COVID-19 Pandemic (e.g. social distancing, sheltering in place) in an effort to limit this patient's exposure and mitigate transmission in our community. All issues noted in this document were discussed and addressed.  A physical exam was not performed with this format.  I connected with Kristin Ballard on 04/13/19 at 11:10 by telephone and verified that I am speaking with the correct person using two identifiers. Kristin Ballard is currently located at home and no one is currently with  her during visit. The provider, Mary-Margaret Daphine Deutscher, FNP is located in their office at time of visit.  I discussed the limitations, risks, security and privacy concerns of performing an evaluation and management service by telephone and the availability of in person appointments. I also discussed with the patient that there may be a patient responsible charge related to this service. The patient expressed understanding and agreed to proceed.   History and Present Illness:   Chief Complaint: Sore Throat   HPI Patient woke this morning with sore throat and white spot son throat. Gets strep yearly and had at same time last year. Does have a low grade fever.   Review of Systems  Constitutional: Positive for fever. Negative for chills.  HENT: Positive for sore throat.   Respiratory: Negative for cough.   Cardiovascular: Negative.   Neurological: Negative for headaches.  Psychiatric/Behavioral: Negative.   All other systems reviewed and are negative.    Observations/Objective: Alert and oriented- answers all questions appropriately No distress Patient says hurts to swallow Self reported white patches on throat  Assessment and Plan: Kristin Ballard in today with chief complaint of Sore Throat   1. Pharyngitis,  unspecified etiology Force fluids Motrin or tylenol OTC OTC decongestant Throat lozenges if help New toothbrush in 3 days Meds ordered this encounter  Medications  . amoxicillin (AMOXIL) 875 MG tablet    Sig: Take 1 tablet (875 mg total) by mouth 2 (two) times daily. 1 po BID    Dispense:  10 tablet    Refill:  0    Order Specific Question:   Supervising Provider    Answer:   Arville Care A [[id number]]    Follow Up Instructions: prn     I discussed the assessment and treatment plan with the patient. The patient was provided an opportunity to ask questions and all were answered. The patient agreed with the plan and demonstrated an understanding of the instructions.   The patient was advised to call back or seek an in-person evaluation if the symptoms worsen or if the condition fails to improve as anticipated.  The above assessment and management plan was discussed with the patient. The patient verbalized understanding of and has agreed to the management plan. Patient is aware to call the clinic if symptoms persist or worsen. Patient is aware when to return to the clinic for a follow-up visit. Patient educated on when it is appropriate to go to the emergency department.   Time call ended:  11:20  I provided 10 minutes of non-face-to-face time during this encounter.    Mary-Margaret Daphine Deutscher, FNP

## 2019-07-08 ENCOUNTER — Encounter: Payer: Self-pay | Admitting: *Deleted

## 2019-09-15 ENCOUNTER — Other Ambulatory Visit: Payer: Self-pay

## 2019-09-15 ENCOUNTER — Other Ambulatory Visit (HOSPITAL_COMMUNITY)
Admission: RE | Admit: 2019-09-15 | Discharge: 2019-09-15 | Disposition: A | Discharge status: Home / Self Care | Payer: Medicaid Other | Source: Ambulatory Visit | Attending: Family | Admitting: Family

## 2019-09-15 ENCOUNTER — Encounter: Payer: Self-pay | Admitting: Family

## 2019-09-15 ENCOUNTER — Ambulatory Visit (INDEPENDENT_AMBULATORY_CARE_PROVIDER_SITE_OTHER): Payer: Medicaid Other | Admitting: Family

## 2019-09-15 VITALS — BP 115/82 | HR 86 | Temp 97.3°F | Ht 63.0 in | Wt 255.2 lb

## 2019-09-15 DIAGNOSIS — Z01419 Encounter for gynecological examination (general) (routine) without abnormal findings: Secondary | ICD-10-CM | POA: Diagnosis not present

## 2019-09-15 DIAGNOSIS — F17200 Nicotine dependence, unspecified, uncomplicated: Secondary | ICD-10-CM

## 2019-09-15 DIAGNOSIS — K219 Gastro-esophageal reflux disease without esophagitis: Secondary | ICD-10-CM

## 2019-09-15 DIAGNOSIS — Z30432 Encounter for removal of intrauterine contraceptive device: Secondary | ICD-10-CM | POA: Diagnosis not present

## 2019-09-15 DIAGNOSIS — Z0000 Encounter for general adult medical examination without abnormal findings: Secondary | ICD-10-CM | POA: Insufficient documentation

## 2019-09-15 DIAGNOSIS — E559 Vitamin D deficiency, unspecified: Secondary | ICD-10-CM

## 2019-09-15 DIAGNOSIS — Z01411 Encounter for gynecological examination (general) (routine) with abnormal findings: Secondary | ICD-10-CM

## 2019-09-15 DIAGNOSIS — Z3009 Encounter for other general counseling and advice on contraception: Secondary | ICD-10-CM

## 2019-09-15 DIAGNOSIS — F320 Major depressive disorder, single episode, mild: Secondary | ICD-10-CM

## 2019-09-15 DIAGNOSIS — Z0001 Encounter for general adult medical examination with abnormal findings: Secondary | ICD-10-CM | POA: Diagnosis not present

## 2019-09-15 MED ORDER — OMEPRAZOLE 20 MG PO CPDR: 20.0000 mg | DELAYED_RELEASE_CAPSULE | Freq: Every day | ORAL | 4 refills | Status: DC

## 2019-09-15 MED ORDER — CETIRIZINE HCL 10 MG PO TABS: 10.0000 mg | ORAL_TABLET | Freq: Every day | ORAL | 4 refills | Status: DC

## 2019-09-15 MED ORDER — NORGESTIMATE-ETH ESTRADIOL 0.25-35 MG-MCG PO TABS: 1.0000 | ORAL_TABLET | Freq: Every day | ORAL | 11 refills | Status: DC

## 2019-09-15 MED ORDER — ESCITALOPRAM OXALATE 5 MG PO TABS: ORAL_TABLET | ORAL | 0 refills | Status: DC

## 2019-09-15 NOTE — Patient Instructions (Signed)
Living With Depression Everyone experiences occasional disappointment, sadness, and loss in their lives. When you are feeling down, blue, or sad for at least 2 weeks in a row, it may mean that you have depression. Depression can affect your thoughts and feelings, relationships, daily activities, and physical health. It is caused by changes in the way your brain functions. If you receive a diagnosis of depression, your health care provider will tell you which type of depression you have and what treatment options are available to you. If you are living with depression, there are ways to help you recover from it and also ways to prevent it from coming back. How to cope with lifestyle changes Coping with stress     Stress is your body's reaction to life changes and events, both good and bad. Stressful situations may include:  Getting married.  The death of a spouse.  Losing a job.  Retiring.  Having a baby. Stress can last just a few hours or it can be ongoing. Stress can play a major role in depression, so it is important to learn both how to cope with stress and how to think about it differently. Talk with your health care provider or a counselor if you would like to learn more about stress reduction. He or she may suggest some stress reduction techniques, such as:  Music therapy. This can include creating music or listening to music. Choose music that you enjoy and that inspires you.  Mindfulness-based meditation. This kind of meditation can be done while sitting or walking. It involves being aware of your normal breaths, rather than trying to control your breathing.  Centering prayer. This is a kind of meditation that involves focusing on a spiritual word or phrase. Choose a word, phrase, or sacred image that is meaningful to you and that brings you peace.  Deep breathing. To do this, expand your stomach and inhale slowly through your nose. Hold your breath for 3-5 seconds, then exhale  slowly, allowing your stomach muscles to relax.  Muscle relaxation. This involves intentionally tensing muscles then relaxing them. Choose a stress reduction technique that fits your lifestyle and personality. Stress reduction techniques take time and practice to develop. Set aside 5-15 minutes a day to do them. Therapists can offer training in these techniques. The training may be covered by some insurance plans. Other things you can do to manage stress include:  Keeping a stress diary. This can help you learn what triggers your stress and ways to control your response.  Understanding what your limits are and saying no to requests or events that lead to a schedule that is too full.  Thinking about how you respond to certain situations. You may not be able to control everything, but you can control how you react.  Adding humor to your life by watching funny films or TV shows.  Making time for activities that help you relax and not feeling guilty about spending your time this way.  Medicines Your health care provider may suggest certain medicines if he or she feels that they will help improve your condition. Avoid using alcohol and other substances that may prevent your medicines from working properly (may interact). It is also important to:  Talk with your pharmacist or health care provider about all the medicines that you take, their possible side effects, and what medicines are safe to take together.  Make it your goal to take part in all treatment decisions (shared decision-making). This includes giving input on   the side effects of medicines. It is best if shared decision-making with your health care provider is part of your total treatment plan. If your health care provider prescribes a medicine, you may not notice the full benefits of it for 4-8 weeks. Most people who are treated for depression need to be on medicine for at least 6-12 months after they feel better. If you are taking  medicines as part of your treatment, do not stop taking medicines without first talking to your health care provider. You may need to have the medicine slowly decreased (tapered) over time to decrease the risk of harmful side effects. Relationships Your health care provider may suggest family therapy along with individual therapy and drug therapy. While there may not be family problems that are causing you to feel depressed, it is still important to make sure your family learns as much as they can about your mental health. Having your family's support can help make your treatment successful. How to recognize changes in your condition Everyone has a different response to treatment for depression. Recovery from major depression happens when you have not had signs of major depression for two months. This may mean that you will start to:  Have more interest in doing activities.  Feel less hopeless than you did 2 months ago.  Have more energy.  Overeat less often, or have better or improving appetite.  Have better concentration. Your health care provider will work with you to decide the next steps in your recovery. It is also important to recognize when your condition is getting worse. Watch for these signs:  Having fatigue or low energy.  Eating too much or too little.  Sleeping too much or too little.  Feeling restless, agitated, or hopeless.  Having trouble concentrating or making decisions.  Having unexplained physical complaints.  Feeling irritable, angry, or aggressive. Get help as soon as you or your family members notice these symptoms coming back. How to get support and help from others How to talk with friends and family members about your condition  Talking to friends and family members about your condition can provide you with one way to get support and guidance. Reach out to trusted friends or family members, explain your symptoms to them, and let them know that you are  working with a health care provider to treat your depression. Financial resources Not all insurance plans cover mental health care, so it is important to check with your insurance carrier. If paying for co-pays or counseling services is a problem, search for a local or county mental health care center. They may be able to offer public mental health care services at low or no cost when you are not able to see a private health care provider. If you are taking medicine for depression, you may be able to get the generic form, which may be less expensive. Some makers of prescription medicines also offer help to patients who cannot afford the medicines they need. Follow these instructions at home:   Get the right amount and quality of sleep.  Cut down on using caffeine, tobacco, alcohol, and other potentially harmful substances.  Try to exercise, such as walking or lifting small weights.  Take over-the-counter and prescription medicines only as told by your health care provider.  Eat a healthy diet that includes plenty of vegetables, fruits, whole grains, low-fat dairy products, and lean protein. Do not eat a lot of foods that are high in solid fats, added sugars, or salt.    Keep all follow-up visits as told by your health care provider. This is important. Contact a health care provider if:  You stop taking your antidepressant medicines, and you have any of these symptoms: ? Nausea. ? Headache. ? Feeling lightheaded. ? Chills and body aches. ? Not being able to sleep (insomnia).  You or your friends and family think your depression is getting worse. Get help right away if:  You have thoughts of hurting yourself or others. If you ever feel like you may hurt yourself or others, or have thoughts about taking your own life, get help right away. You can go to your nearest emergency department or call:  Your local emergency services (911 in the [place]  A suicide crisis helpline, such as the  National Suicide Prevention Lifeline at 1-800-273-8255. This is open 24-hours a day. Summary  If you are living with depression, there are ways to help you recover from it and also ways to prevent it from coming back.  Work with your health care team to create a management plan that includes counseling, stress management techniques, and healthy lifestyle habits. This information is not intended to replace advice given to you by your health care provider. Make sure you discuss any questions you have with your health care provider. Document Revised: 07/18/2018 Document Reviewed: 02/27/2016 Elsevier Patient Education  2020 [employer]  

## 2019-09-15 NOTE — Progress Notes (Signed)
Subjective:    Patient ID: Kristin Ballard, female    DOB: September 13, 1985, 34 y.o.   MRN: [id number]  Chief Complaint  Patient presents with  . Annual Exam    with pap discuss paragaurd removed    Pt presents to the office today for CPE with pap. She would like her paragard removed today. States she had this placed three years ago. She reports intermittent menstrual cycles with some times with twice a week and heavy cramping.  Gynecologic Exam The patient's primary symptoms include genital itching and vaginal bleeding (abnromal). The patient's pertinent negatives include no genital odor or vaginal discharge. Associated symptoms include nausea. Pertinent negatives include no frequency, hematuria, painful intercourse or urgency.  Gastroesophageal Reflux She complains of belching, heartburn and nausea. This is a chronic problem. The current episode started more than 1 year ago. The problem occurs occasionally. Risk factors include obesity and smoking/tobacco exposure. She has tried a PPI for the symptoms. The treatment provided moderate relief.  Nicotine Dependence Presents for follow-up visit. Her urge triggers include company of smokers. The symptoms have been stable. She smokes 1 pack of cigarettes per day.  Depression        This is a new problem.  The current episode started more than 1 year ago.   The onset quality is gradual.   The problem occurs intermittently.  Associated symptoms include helplessness, irritable, restlessness, decreased interest and sad.  Associated symptoms include no hopelessness.  Past treatments include nothing.     Review of Systems  Gastrointestinal: Positive for heartburn and nausea.  Genitourinary: Negative for frequency, hematuria, urgency and vaginal discharge.  Psychiatric/Behavioral: Positive for depression.  All other systems reviewed and are negative.  Family History  Problem Relation Age of Onset  . Depression Mother   . Diabetes Father   . Cancer  Father        melanoma   Social History   Socioeconomic History  . Marital status: Unknown    Spouse name: Not on file  . Number of children: Not on file  . Years of education: Not on file  . Highest education level: Not on file  Occupational History  . Not on file  Tobacco Use  . Smoking status: Heavy Tobacco Smoker    Packs/day: 0.50  . Smokeless tobacco: Never Used  Substance and Sexual Activity  . Alcohol use: No  . Drug use: Yes    Types: Marijuana  . Sexual activity: Yes    Birth control/protection: Pill  Other Topics Concern  . Not on file  Social History Narrative  . Not on file   Social Determinants of Health   Financial Resource Strain:   . Difficulty of Paying Living Expenses:   Food Insecurity:   . Worried About Charity fundraiser in the Last Year:   . Arboriculturist in the Last Year:   Transportation Needs:   . Film/video editor (Medical):   Marland Kitchen Lack of Transportation (Non-Medical):   Physical Activity:   . Days of Exercise per Week:   . Minutes of Exercise per Session:   Stress:   . Feeling of Stress :   Social Connections:   . Frequency of Communication with Friends and Family:   . Frequency of Social Gatherings with Friends and Family:   . Attends Religious Services:   . Active Member of Clubs or Organizations:   . Attends Archivist Meetings:   Marland Kitchen Marital Status:   Intimate  Partner Violence:   . Fear of Current or Ex-Partner:   . Emotionally Abused:   Marland Kitchen Physically Abused:   . Sexually Abused:        Objective:   Physical Exam Vitals reviewed.  Constitutional:      General: She is irritable. She is not in acute distress.    Appearance: She is well-developed. She is obese.  HENT:     Head: Normocephalic and atraumatic.     Right Ear: Tympanic membrane normal.     Left Ear: Tympanic membrane normal.  Eyes:     Pupils: Pupils are equal, round, and reactive to light.  Neck:     Thyroid: No thyromegaly.  Cardiovascular:       Rate and Rhythm: Normal rate and regular rhythm.     Heart sounds: Normal heart sounds. No murmur.  Pulmonary:     Effort: Pulmonary effort is normal. No respiratory distress.     Breath sounds: Normal breath sounds. No wheezing.  Chest:     Breasts:        Right: No swelling, bleeding, inverted nipple, mass, nipple discharge, skin change or tenderness.        Left: No swelling, bleeding, inverted nipple, mass, nipple discharge, skin change or tenderness.  Abdominal:     General: Bowel sounds are normal. There is no distension.     Palpations: Abdomen is soft.     Tenderness: There is no abdominal tenderness.  Genitourinary:    Comments: Bimanual exam- no adnexal masses or tenderness, ovaries nonpalpable   Cervix parous and pink- No discharge  Musculoskeletal:        General: No tenderness. Normal range of motion.     Cervical back: Normal range of motion and neck supple.  Skin:    General: Skin is warm and dry.  Neurological:     Mental Status: She is alert and oriented to person, place, and time.     Cranial Nerves: No cranial nerve deficit.     Deep Tendon Reflexes: Reflexes are normal and symmetric.  Psychiatric:        Behavior: Behavior normal.        Thought Content: Thought content normal.        Judgment: Judgment normal.     BP 115/82   Pulse 86   Temp (!) 97.3 F (36.3 C) (Temporal)   Ht 5' 3"  (1.6 m)   Wt 255 lb 3.2 oz (115.8 kg)   LMP 09/15/2019   SpO2 99%   BMI 45.21 kg/m        Assessment & Plan:  KAE LAUMAN comes in today with chief complaint of Annual Exam (with pap discuss paragaurd removed )   Diagnosis and orders addressed:  1. Annual physical exam - [facility] - CMP14+EGFR - CBC with Differential/Platelet - Lipid panel - TSH - VITAMIN D 25 Hydroxy (Vit-D Deficiency, Fractures) - Cytology - PAP([place])  2. Depression, major, single episode, mild (HCC) Will start Lexapro 5 mg  Stress management  -  escitalopram (LEXAPRO) 5 MG tablet; Take 1 tablet (5 mg total) by mouth daily for 14 days, THEN 2 tablets (10 mg total) daily for 21 days.  Dispense: 56 tablet; Refill: 0 - CMP14+EGFR - CBC with Differential/Platelet  3. Gastroesophageal reflux disease, unspecified whether esophagitis present -Diet discussed- Avoid fried, spicy, citrus foods, caffeine and alcohol -Do not eat 2-3 hours before bedtime -Encouraged small frequent meals -Avoid NSAID's - omeprazole (PRILOSEC) 20 MG  capsule; Take 1 capsule (20 mg total) by mouth daily.  Dispense: 90 capsule; Refill: 4 - CMP14+EGFR - CBC with Differential/Platelet  4. Current smoker - CMP14+EGFR - CBC with Differential/Platelet  5. Morbid obesity ([place]) - CMP14+EGFR - CBC with Differential/Platelet  6. Vitamin D deficiency - CMP14+EGFR - CBC with Differential/Platelet - VITAMIN D 25 Hydroxy (Vit-D Deficiency, Fractures)  7. Gynecologic exam normal - CMP14+EGFR - CBC with Differential/Platelet - Cytology - PAP([place])  8. Encounter for IUD removal - CMP14+EGFR - CBC with Differential/Platelet  9. Gastroesophageal reflux disease - omeprazole (PRILOSEC) 20 MG capsule; Take 1 capsule (20 mg total) by mouth daily.  Dispense: 90 capsule; Refill: 4 - CMP14+EGFR - CBC with Differential/Platelet  10. Birth control counseling High risk given obese, smoker. Discussed smoking cessation discussed.  Will start OC today Risks discussed - norgestimate-ethinyl estradiol ([place]) 0.25-35 MG-MCG tablet; Take 1 tablet by mouth daily.  Dispense: 1 Package; Refill: 11   Labs pending Health Maintenance reviewed Diet and exercise encouraged  Follow up plan: 6 weeks to recheck Depression   Evelina Dun, FNP

## 2019-09-16 LAB — CBC WITH DIFFERENTIAL/PLATELET
Basophils Absolute: 0 10*3/uL (ref 0.0–0.2)
Basos: 0 %
EOS (ABSOLUTE): 0.1 10*3/uL (ref 0.0–0.4)
Eos: 1 %
Hematocrit: 36.9 % (ref 34.0–46.6)
Hemoglobin: 12.6 g/dL (ref 11.1–15.9)
Immature Grans (Abs): 0 10*3/uL (ref 0.0–0.1)
Immature Granulocytes: 0 %
Lymphocytes Absolute: 2.4 10*3/uL (ref 0.7–3.1)
Lymphs: 33 %
MCH: 33.6 pg — ABNORMAL HIGH (ref 26.6–33.0)
MCHC: 34.1 g/dL (ref 31.5–35.7)
MCV: 98 fL — ABNORMAL HIGH (ref 79–97)
Monocytes Absolute: 0.3 10*3/uL (ref 0.1–0.9)
Monocytes: 4 %
Neutrophils Absolute: 4.4 10*3/uL (ref 1.4–7.0)
Neutrophils: 62 %
Platelets: 272 10*3/uL (ref 150–450)
RBC: 3.75 x10E6/uL — ABNORMAL LOW (ref 3.77–5.28)
RDW: 15.7 % — ABNORMAL HIGH (ref 11.7–15.4)
WBC: 7.3 10*3/uL (ref 3.4–10.8)

## 2019-09-16 LAB — CMP14+EGFR
ALT: 8 IU/L (ref 0–32)
AST: 13 IU/L (ref 0–40)
Albumin/Globulin Ratio: 1.8 (ref 1.2–2.2)
Albumin: 4.4 g/dL (ref 3.8–4.8)
Alkaline Phosphatase: 70 IU/L (ref 48–121)
BUN/Creatinine Ratio: 5 — ABNORMAL LOW (ref 9–23)
BUN: 5 mg/dL — ABNORMAL LOW (ref 6–20)
Bilirubin Total: 0.4 mg/dL (ref 0.0–1.2)
CO2: 22 mmol/L (ref 20–29)
Calcium: 9.1 mg/dL (ref 8.7–10.2)
Chloride: 103 mmol/L (ref 96–106)
Creatinine, Ser: 0.91 mg/dL (ref 0.57–1.00)
GFR calc Af Amer: 94 mL/min/{1.73_m2} (ref >59)
GFR calc non Af Amer: 81 mL/min/{1.73_m2} (ref >59)
Globulin, Total: 2.4 g/dL (ref 1.5–4.5)
Glucose: 103 mg/dL — ABNORMAL HIGH (ref 65–99)
Potassium: 4.3 mmol/L (ref 3.5–5.2)
Sodium: 140 mmol/L (ref 134–144)
Total Protein: 6.8 g/dL (ref 6.0–8.5)

## 2019-09-16 LAB — LIPID PANEL
Chol/HDL Ratio: 3.8 ratio (ref 0.0–4.4)
Cholesterol, Total: 227 mg/dL — ABNORMAL HIGH (ref 100–199)
HDL: 59 mg/dL (ref >39)
LDL Chol Calc (NIH): 116 mg/dL — ABNORMAL HIGH (ref 0–99)
Triglycerides: 304 mg/dL — ABNORMAL HIGH (ref 0–149)
VLDL Cholesterol Cal: 52 mg/dL — ABNORMAL HIGH (ref 5–40)

## 2019-09-16 LAB — VITAMIN D 25 HYDROXY (VIT D DEFICIENCY, FRACTURES): Vit D, 25-Hydroxy: 28.4 ng/mL — ABNORMAL LOW (ref 30.0–100.0)

## 2019-09-16 LAB — TSH: TSH: 2.29 u[IU]/mL (ref 0.450–4.500)

## 2019-09-17 ENCOUNTER — Encounter: Payer: Medicaid Other | Admitting: Family

## 2019-09-17 ENCOUNTER — Other Ambulatory Visit: Payer: Self-pay | Admitting: Family

## 2019-09-17 LAB — CYTOLOGY - PAP: Diagnosis: NEGATIVE

## 2019-09-17 MED ORDER — VITAMIN D (ERGOCALCIFEROL) 1.25 MG (50000 UNIT) PO CAPS
50000.0000 [IU] | ORAL_CAPSULE | ORAL | 3 refills | Status: DC
Start: 2019-09-17 — End: 2020-10-04

## 2019-10-27 ENCOUNTER — Encounter: Payer: Self-pay | Admitting: Family

## 2019-10-27 ENCOUNTER — Ambulatory Visit: Payer: Medicaid Other | Admitting: Family

## 2019-10-27 ENCOUNTER — Other Ambulatory Visit: Payer: Self-pay

## 2019-10-27 VITALS — BP 111/80 | HR 74 | Temp 97.5°F | Ht 63.0 in | Wt 254.4 lb

## 2019-10-27 DIAGNOSIS — T148XXA Other injury of unspecified body region, initial encounter: Secondary | ICD-10-CM | POA: Diagnosis not present

## 2019-10-27 DIAGNOSIS — F320 Major depressive disorder, single episode, mild: Secondary | ICD-10-CM

## 2019-10-27 DIAGNOSIS — R233 Spontaneous ecchymoses: Secondary | ICD-10-CM

## 2019-10-27 DIAGNOSIS — R5383 Other fatigue: Secondary | ICD-10-CM | POA: Diagnosis not present

## 2019-10-27 MED ORDER — ESCITALOPRAM OXALATE 10 MG PO TABS: 10.0000 mg | ORAL_TABLET | Freq: Every day | ORAL | 3 refills | Status: DC
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
## 2019-10-27 NOTE — Patient Instructions (Signed)
Living With Depression Everyone experiences occasional disappointment, sadness, and loss in their lives. When you are feeling down, blue, or sad for at least 2 weeks in a row, it may mean that you have depression. Depression can affect your thoughts and feelings, relationships, daily activities, and physical health. It is caused by changes in the way your brain functions. If you receive a diagnosis of depression, your health care provider will tell you which type of depression you have and what treatment options are available to you. If you are living with depression, there are ways to help you recover from it and also ways to prevent it from coming back. How to cope with lifestyle changes Coping with stress     Stress is your body's reaction to life changes and events, both good and bad. Stressful situations may include:  Getting married.  The death of a spouse.  Losing a job.  Retiring.  Having a baby. Stress can last just a few hours or it can be ongoing. Stress can play a major role in depression, so it is important to learn both how to cope with stress and how to think about it differently. Talk with your health care provider or a counselor if you would like to learn more about stress reduction. He or she may suggest some stress reduction techniques, such as:  Music therapy. This can include creating music or listening to music. Choose music that you enjoy and that inspires you.  Mindfulness-based meditation. This kind of meditation can be done while sitting or walking. It involves being aware of your normal breaths, rather than trying to control your breathing.  Centering prayer. This is a kind of meditation that involves focusing on a spiritual word or phrase. Choose a word, phrase, or sacred image that is meaningful to you and that brings you peace.  Deep breathing. To do this, expand your stomach and inhale slowly through your nose. Hold your breath for 3-5 seconds, then exhale  slowly, allowing your stomach muscles to relax.  Muscle relaxation. This involves intentionally tensing muscles then relaxing them. Choose a stress reduction technique that fits your lifestyle and personality. Stress reduction techniques take time and practice to develop. Set aside 5-15 minutes a day to do them. Therapists can offer training in these techniques. The training may be covered by some insurance plans. Other things you can do to manage stress include:  Keeping a stress diary. This can help you learn what triggers your stress and ways to control your response.  Understanding what your limits are and saying no to requests or events that lead to a schedule that is too full.  Thinking about how you respond to certain situations. You may not be able to control everything, but you can control how you react.  Adding humor to your life by watching funny films or TV shows.  Making time for activities that help you relax and not feeling guilty about spending your time this way.  Medicines Your health care provider may suggest certain medicines if he or she feels that they will help improve your condition. Avoid using alcohol and other substances that may prevent your medicines from working properly (may interact). It is also important to:  Talk with your pharmacist or health care provider about all the medicines that you take, their possible side effects, and what medicines are safe to take together.  Make it your goal to take part in all treatment decisions (shared decision-making). This includes giving input on   the side effects of medicines. It is best if shared decision-making with your health care provider is part of your total treatment plan. If your health care provider prescribes a medicine, you may not notice the full benefits of it for 4-8 weeks. Most people who are treated for depression need to be on medicine for at least 6-12 months after they feel better. If you are taking  medicines as part of your treatment, do not stop taking medicines without first talking to your health care provider. You may need to have the medicine slowly decreased (tapered) over time to decrease the risk of harmful side effects. Relationships Your health care provider may suggest family therapy along with individual therapy and drug therapy. While there may not be family problems that are causing you to feel depressed, it is still important to make sure your family learns as much as they can about your mental health. Having your family's support can help make your treatment successful. How to recognize changes in your condition Everyone has a different response to treatment for depression. Recovery from major depression happens when you have not had signs of major depression for two months. This may mean that you will start to:  Have more interest in doing activities.  Feel less hopeless than you did 2 months ago.  Have more energy.  Overeat less often, or have better or improving appetite.  Have better concentration. Your health care provider will work with you to decide the next steps in your recovery. It is also important to recognize when your condition is getting worse. Watch for these signs:  Having fatigue or low energy.  Eating too much or too little.  Sleeping too much or too little.  Feeling restless, agitated, or hopeless.  Having trouble concentrating or making decisions.  Having unexplained physical complaints.  Feeling irritable, angry, or aggressive. Get help as soon as you or your family members notice these symptoms coming back. How to get support and help from others How to talk with friends and family members about your condition  Talking to friends and family members about your condition can provide you with one way to get support and guidance. Reach out to trusted friends or family members, explain your symptoms to them, and let them know that you are  working with a health care provider to treat your depression. Financial resources Not all insurance plans cover mental health care, so it is important to check with your insurance carrier. If paying for co-pays or counseling services is a problem, search for a local or county mental health care center. They may be able to offer public mental health care services at low or no cost when you are not able to see a private health care provider. If you are taking medicine for depression, you may be able to get the generic form, which may be less expensive. Some makers of prescription medicines also offer help to patients who cannot afford the medicines they need. Follow these instructions at home:   Get the right amount and quality of sleep.  Cut down on using caffeine, tobacco, alcohol, and other potentially harmful substances.  Try to exercise, such as walking or lifting small weights.  Take over-the-counter and prescription medicines only as told by your health care provider.  Eat a healthy diet that includes plenty of vegetables, fruits, whole grains, low-fat dairy products, and lean protein. Do not eat a lot of foods that are high in solid fats, added sugars, or salt.    Keep all follow-up visits as told by your health care provider. This is important. Contact a health care provider if:  You stop taking your antidepressant medicines, and you have any of these symptoms: ? Nausea. ? Headache. ? Feeling lightheaded. ? Chills and body aches. ? Not being able to sleep (insomnia).  You or your friends and family think your depression is getting worse. Get help right away if:  You have thoughts of hurting yourself or others. If you ever feel like you may hurt yourself or others, or have thoughts about taking your own life, get help right away. You can go to your nearest emergency department or call:  Your local emergency services (911 in the [place]  A suicide crisis helpline, such as the  National Suicide Prevention Lifeline at 1-800-273-8255. This is open 24-hours a day. Summary  If you are living with depression, there are ways to help you recover from it and also ways to prevent it from coming back.  Work with your health care team to create a management plan that includes counseling, stress management techniques, and healthy lifestyle habits. This information is not intended to replace advice given to you by your health care provider. Make sure you discuss any questions you have with your health care provider. Document Revised: 07/18/2018 Document Reviewed: 02/27/2016 Elsevier Patient Education  2020 [employer]  

## 2019-10-30 LAB — IRON: Iron: 157 ug/dL (ref 27–159)

## 2019-10-30 LAB — VITAMIN B2, WHOLE BLOOD: Vitamin B2, Whole Blood: 189 ug/L (ref 137–370)

## 2019-12-16 ENCOUNTER — Other Ambulatory Visit: Payer: Self-pay | Admitting: Family

## 2019-12-16 DIAGNOSIS — K219 Gastro-esophageal reflux disease without esophagitis: Secondary | ICD-10-CM

## 2019-12-28 DIAGNOSIS — S82434A Nondisplaced oblique fracture of shaft of right fibula, initial encounter for closed fracture: Secondary | ICD-10-CM | POA: Diagnosis not present

## 2019-12-28 DIAGNOSIS — M25571 Pain in right ankle and joints of right foot: Secondary | ICD-10-CM | POA: Diagnosis not present

## 2019-12-28 DIAGNOSIS — S99911A Unspecified injury of right ankle, initial encounter: Secondary | ICD-10-CM | POA: Diagnosis not present

## 2019-12-28 DIAGNOSIS — S82431A Displaced oblique fracture of shaft of right fibula, initial encounter for closed fracture: Secondary | ICD-10-CM | POA: Diagnosis not present

## 2019-12-28 DIAGNOSIS — W108XXA Fall (on) (from) other stairs and steps, initial encounter: Secondary | ICD-10-CM | POA: Diagnosis not present

## 2019-12-28 DIAGNOSIS — M79604 Pain in right leg: Secondary | ICD-10-CM | POA: Diagnosis not present

## 2019-12-28 DIAGNOSIS — M7989 Other specified soft tissue disorders: Secondary | ICD-10-CM | POA: Diagnosis not present

## 2019-12-28 DIAGNOSIS — S82831A Other fracture of upper and lower end of right fibula, initial encounter for closed fracture: Secondary | ICD-10-CM | POA: Diagnosis not present

## 2019-12-30 DIAGNOSIS — S82831A Other fracture of upper and lower end of right fibula, initial encounter for closed fracture: Secondary | ICD-10-CM | POA: Diagnosis not present

## 2019-12-30 DIAGNOSIS — S8254XA Nondisplaced fracture of medial malleolus of right tibia, initial encounter for closed fracture: Secondary | ICD-10-CM | POA: Diagnosis not present

## 2019-12-30 DIAGNOSIS — S93431A Sprain of tibiofibular ligament of right ankle, initial encounter: Secondary | ICD-10-CM | POA: Diagnosis not present

## 2020-01-05 DIAGNOSIS — Z01812 Encounter for preprocedural laboratory examination: Secondary | ICD-10-CM | POA: Diagnosis not present

## 2020-01-05 DIAGNOSIS — Z20822 Contact with and (suspected) exposure to covid-19: Secondary | ICD-10-CM | POA: Diagnosis not present

## 2020-01-07 DIAGNOSIS — S82831D Other fracture of upper and lower end of right fibula, subsequent encounter for closed fracture with routine healing: Secondary | ICD-10-CM | POA: Diagnosis not present

## 2020-01-07 DIAGNOSIS — S93431A Sprain of tibiofibular ligament of right ankle, initial encounter: Secondary | ICD-10-CM | POA: Diagnosis not present

## 2020-01-07 DIAGNOSIS — S9304XA Dislocation of right ankle joint, initial encounter: Secondary | ICD-10-CM | POA: Diagnosis not present

## 2020-01-07 DIAGNOSIS — G8918 Other acute postprocedural pain: Secondary | ICD-10-CM | POA: Diagnosis not present

## 2020-02-01 DIAGNOSIS — S92355A Nondisplaced fracture of fifth metatarsal bone, left foot, initial encounter for closed fracture: Secondary | ICD-10-CM | POA: Diagnosis not present

## 2020-02-01 DIAGNOSIS — S82831D Other fracture of upper and lower end of right fibula, subsequent encounter for closed fracture with routine healing: Secondary | ICD-10-CM | POA: Diagnosis not present

## 2020-02-01 DIAGNOSIS — S93431D Sprain of tibiofibular ligament of right ankle, subsequent encounter: Secondary | ICD-10-CM | POA: Diagnosis not present

## 2020-02-29 DIAGNOSIS — S92355A Nondisplaced fracture of fifth metatarsal bone, left foot, initial encounter for closed fracture: Secondary | ICD-10-CM | POA: Diagnosis not present

## 2020-02-29 DIAGNOSIS — S93431D Sprain of tibiofibular ligament of right ankle, subsequent encounter: Secondary | ICD-10-CM | POA: Diagnosis not present

## 2020-04-11 DIAGNOSIS — S93431D Sprain of tibiofibular ligament of right ankle, subsequent encounter: Secondary | ICD-10-CM | POA: Diagnosis not present

## 2020-04-11 DIAGNOSIS — S92355A Nondisplaced fracture of fifth metatarsal bone, left foot, initial encounter for closed fracture: Secondary | ICD-10-CM | POA: Diagnosis not present

## 2020-09-15 ENCOUNTER — Encounter: Payer: Medicaid Other | Admitting: Family

## 2020-10-04 ENCOUNTER — Encounter: Payer: Self-pay | Admitting: Family

## 2020-10-04 ENCOUNTER — Other Ambulatory Visit: Payer: Self-pay

## 2020-10-04 ENCOUNTER — Ambulatory Visit: Payer: Medicaid Other | Admitting: Family

## 2020-10-04 VITALS — BP 119/86 | HR 79 | Temp 97.7°F | Ht 63.0 in | Wt 248.4 lb

## 2020-10-04 DIAGNOSIS — Z0000 Encounter for general adult medical examination without abnormal findings: Secondary | ICD-10-CM

## 2020-10-04 DIAGNOSIS — Z3009 Encounter for other general counseling and advice on contraception: Secondary | ICD-10-CM | POA: Diagnosis not present

## 2020-10-04 DIAGNOSIS — F17200 Nicotine dependence, unspecified, uncomplicated: Secondary | ICD-10-CM

## 2020-10-04 DIAGNOSIS — J301 Allergic rhinitis due to pollen: Secondary | ICD-10-CM | POA: Diagnosis not present

## 2020-10-04 DIAGNOSIS — K219 Gastro-esophageal reflux disease without esophagitis: Secondary | ICD-10-CM | POA: Diagnosis not present

## 2020-10-04 DIAGNOSIS — E559 Vitamin D deficiency, unspecified: Secondary | ICD-10-CM

## 2020-10-04 DIAGNOSIS — F320 Major depressive disorder, single episode, mild: Secondary | ICD-10-CM

## 2020-10-04 DIAGNOSIS — Z0001 Encounter for general adult medical examination with abnormal findings: Secondary | ICD-10-CM | POA: Diagnosis not present

## 2020-10-04 MED ORDER — ESCITALOPRAM OXALATE 20 MG PO TABS: 20.0000 mg | ORAL_TABLET | Freq: Every day | ORAL | 4 refills | Status: DC

## 2020-10-04 MED ORDER — FEXOFENADINE HCL 180 MG PO TABS: 180.0000 mg | ORAL_TABLET | Freq: Every day | ORAL | 3 refills | Status: DC

## 2020-10-04 MED ORDER — VITAMIN D (ERGOCALCIFEROL) 1.25 MG (50000 UNIT) PO CAPS: 50000.0000 [IU] | ORAL_CAPSULE | ORAL | 3 refills | Status: DC

## 2020-10-04 MED ORDER — NORGESTIMATE-ETH ESTRADIOL 0.25-35 MG-MCG PO TABS: 1.0000 | ORAL_TABLET | Freq: Every day | ORAL | 1 refills | Status: DC

## 2020-10-04 MED ORDER — OMEPRAZOLE 20 MG PO CPDR: 20.0000 mg | DELAYED_RELEASE_CAPSULE | Freq: Every day | ORAL | 4 refills | Status: DC

## 2020-10-04 NOTE — Patient Instructions (Signed)
Health Maintenance, Female Adopting a healthy lifestyle and getting preventive care are important in promoting health and wellness. Ask your health care provider about: The right schedule for you to have regular tests and exams. Things you can do on your own to prevent diseases and keep yourself healthy. What should I know about diet, weight, and exercise? Eat a healthy diet  Eat a diet that includes plenty of vegetables, fruits, low-fat dairy products, and lean protein. Do not eat a lot of foods that are high in solid fats, added sugars, or sodium.  Maintain a healthy weight Body mass index (BMI) is used to identify weight problems. It estimates body fat based on height and weight. Your health care provider can help determineyour BMI and help you achieve or maintain a healthy weight. Get regular exercise Get regular exercise. This is one of the most important things you can do for your health. Most adults should: Exercise for at least 150 minutes each week. The exercise should increase your heart rate and make you sweat (moderate-intensity exercise). Do strengthening exercises at least twice a week. This is in addition to the moderate-intensity exercise. Spend less time sitting. Even light physical activity can be beneficial. Watch cholesterol and blood lipids Have your blood tested for lipids and cholesterol at 37 years of age, then havethis test every 5 years. Have your cholesterol levels checked more often if: Your lipid or cholesterol levels are high. You are older than 37 years of age. You are at high risk for heart disease. What should I know about cancer screening? Depending on your health history and family history, you may need to have cancer screening at various ages. This may include screening for: Breast cancer. Cervical cancer. Colorectal cancer. Skin cancer. Lung cancer. What should I know about heart disease, diabetes, and high blood pressure? Blood pressure and heart  disease High blood pressure causes heart disease and increases the risk of stroke. This is more likely to develop in people who have high blood pressure readings, are of African descent, or are overweight. Have your blood pressure checked: Every 3-5 years if you are 18-39 years of age. Every year if you are 40 years old or older. Diabetes Have regular diabetes screenings. This checks your fasting blood sugar level. Have the screening done: Once every three years after age 40 if you are at a normal weight and have a low risk for diabetes. More often and at a younger age if you are overweight or have a high risk for diabetes. What should I know about preventing infection? Hepatitis B If you have a higher risk for hepatitis B, you should be screened for this virus. Talk with your health care provider to find out if you are at risk forhepatitis B infection. Hepatitis C Testing is recommended for: Everyone born from 1945 through 1965. Anyone with known risk factors for hepatitis C. Sexually transmitted infections (STIs) Get screened for STIs, including gonorrhea and chlamydia, if: You are sexually active and are younger than 37 years of age. You are older than 37 years of age and your health care provider tells you that you are at risk for this type of infection. Your sexual activity has changed since you were last screened, and you are at increased risk for chlamydia or gonorrhea. Ask your health care provider if you are at risk. Ask your health care provider about whether you are at high risk for HIV. Your health care provider may recommend a prescription medicine to help   prevent HIV infection. If you choose to take medicine to prevent HIV, you should first get tested for HIV. You should then be tested every 3 months for as long as you are taking the medicine. Pregnancy If you are about to stop having your period (premenopausal) and you may become pregnant, seek counseling before you get  pregnant. Take 400 to 800 micrograms (mcg) of folic acid every day if you become pregnant. Ask for birth control (contraception) if you want to prevent pregnancy. Osteoporosis and menopause Osteoporosis is a disease in which the bones lose minerals and strength with aging. This can result in bone fractures. If you are 65 years old or older, or if you are at risk for osteoporosis and fractures, ask your health care provider if you should: Be screened for bone loss. Take a calcium or vitamin D supplement to lower your risk of fractures. Be given hormone replacement therapy (HRT) to treat symptoms of menopause. Follow these instructions at home: Lifestyle Do not use any products that contain nicotine or tobacco, such as cigarettes, e-cigarettes, and chewing tobacco. If you need help quitting, ask your health care provider. Do not use street drugs. Do not share needles. Ask your health care provider for help if you need support or information about quitting drugs. Alcohol use Do not drink alcohol if: Your health care provider tells you not to drink. You are pregnant, may be pregnant, or are planning to become pregnant. If you drink alcohol: Limit how much you use to 0-1 drink a day. Limit intake if you are breastfeeding. Be aware of how much alcohol is in your drink. In the [place] one drink equals one 12 oz bottle of beer (355 mL), one 5 oz glass of wine (148 mL), or one 1 oz glass of hard liquor (44 mL). General instructions Schedule regular health, dental, and eye exams. Stay current with your vaccines. Tell your health care provider if: You often feel depressed. You have ever been abused or do not feel safe at home. Summary Adopting a healthy lifestyle and getting preventive care are important in promoting health and wellness. Follow your health care provider's instructions about healthy diet, exercising, and getting tested or screened for diseases. Follow your health care provider's  instructions on monitoring your cholesterol and blood pressure. This information is not intended to replace advice given to you by your health care provider. Make sure you discuss any questions you have with your healthcare provider. Document Revised: 03/19/2018 Document Reviewed: 03/19/2018 Elsevier Patient Education  2022 Else[employer] Inc.  

## 2020-10-04 NOTE — Progress Notes (Signed)
Subjective:    Patient ID: Kristin Ballard, female    DOB: 1986-02-11, 35 y.o.   MRN: [id number]  Chief Complaint  Patient presents with   Annual Exam   Pt presents to the office today for CPE without pap.   Kristin Ballard reports Kristin Ballard packing Kristin Ballard home and has got into a lot of dust and reports Kristin Ballard allergies have worsen. Wants to change from zyrtec.  Gastroesophageal Reflux Kristin Ballard complains of belching and heartburn. This is a chronic problem. The current episode started more than 1 year ago. The problem occurs occasionally. The problem has been waxing and waning. Risk factors include obesity. Kristin Ballard has tried a PPI for the symptoms. The treatment provided moderate relief.  Anxiety Presents for follow-up visit. Symptoms include depressed mood, excessive worry, irritability, nervous/anxious behavior and restlessness. Symptoms occur most days. The severity of symptoms is moderate.    Depression        This is a chronic problem.  The current episode started more than 1 year ago.   The onset quality is gradual.   The problem occurs intermittently.  Associated symptoms include irritable, restlessness and sad.  Associated symptoms include no helplessness and no hopelessness.  Past treatments include SSRIs - Selective serotonin reuptake inhibitors.  Past medical history includes anxiety.   Nicotine Dependence Presents for follow-up visit. Symptoms include irritability. Kristin Ballard urge triggers include company of smokers. The symptoms have been stable. Kristin Ballard smokes 1 pack of cigarettes per day.     Review of Systems  Constitutional:  Positive for irritability.  Gastrointestinal:  Positive for heartburn.  Psychiatric/Behavioral:  Positive for depression. The patient is nervous/anxious.   All other systems reviewed and are negative.  Family History  Problem Relation Age of Onset   Depression Mother    Diabetes Father    Cancer Father        melanoma   Social History   Socioeconomic History   Marital status:  Unknown    Spouse name: Not on file   Number of children: Not on file   Years of education: Not on file   Highest education level: Not on file  Occupational History   Not on file  Tobacco Use   Smoking status: Heavy Smoker    Packs/day: 0.50    Pack years: 0.00    Types: Cigarettes   Smokeless tobacco: Never  Vaping Use   Vaping Use: Never used  Substance and Sexual Activity   Alcohol use: No   Drug use: Yes    Types: Marijuana   Sexual activity: Yes    Birth control/protection: Pill  Other Topics Concern   Not on file  Social History Narrative   Not on file   Social Determinants of Health   Financial Resource Strain: Not on file  Food Insecurity: Not on file  Transportation Needs: Not on file  Physical Activity: Not on file  Stress: Not on file  Social Connections: Not on file       Objective:   Physical Exam Vitals reviewed.  Constitutional:      General: Kristin Ballard is irritable. Kristin Ballard is not in acute distress.    Appearance: Kristin Ballard is well-developed. Kristin Ballard is obese.  HENT:     Head: Normocephalic and atraumatic.     Right Ear: Tympanic membrane normal.     Left Ear: Tympanic membrane normal.  Eyes:     Pupils: Pupils are equal, round, and reactive to light.  Neck:     Thyroid: No thyromegaly.  Cardiovascular:     Rate and Rhythm: Normal rate and regular rhythm.     Heart sounds: Normal heart sounds. No murmur heard. Pulmonary:     Effort: Pulmonary effort is normal. No respiratory distress.     Breath sounds: Normal breath sounds. No wheezing.  Abdominal:     General: Bowel sounds are normal. There is no distension.     Palpations: Abdomen is soft.     Tenderness: There is no abdominal tenderness.  Musculoskeletal:        General: No tenderness. Normal range of motion.     Cervical back: Normal range of motion and neck supple.  Skin:    General: Skin is warm and dry.  Neurological:     Mental Status: Kristin Ballard is alert and oriented to person, place, and time.      Cranial Nerves: No cranial nerve deficit.     Deep Tendon Reflexes: Reflexes are normal and symmetric.  Psychiatric:        Behavior: Behavior normal.        Thought Content: Thought content normal.        Judgment: Judgment normal.      BP 119/86   Pulse 79   Temp 97.7 F (36.5 C) (Temporal)   Ht 5' 3"  (1.6 m)   Wt 248 lb 6.4 oz (112.7 kg)   SpO2 98%   BMI 44.00 kg/m      Assessment & Plan:  Kristin Ballard comes in today with chief complaint of Annual Exam   Diagnosis and orders addressed:  1. Birth control counseling - norgestimate-ethinyl estradiol ([place] 28) 0.25-35 MG-MCG tablet; Take 1 tablet by mouth daily.  Dispense: 90 tablet; Refill: 1 - CMP14+EGFR - CBC with Differential/Platelet  2. Gastroesophageal reflux disease, unspecified whether esophagitis present - omeprazole (PRILOSEC) 20 MG capsule; Take 1 capsule (20 mg total) by mouth daily.  Dispense: 90 capsule; Refill: 4 - CMP14+EGFR - CBC with Differential/Platelet  3. Annual physical exam - CMP14+EGFR - CBC with Differential/Platelet - Lipid panel - TSH - VITAMIN D 25 Hydroxy (Vit-D Deficiency, Fractures)  4. Current smoker - CMP14+EGFR - CBC with Differential/Platelet  5. Vitamin D deficiency - Vitamin D, Ergocalciferol, (DRISDOL) 1.25 MG (50000 UNIT) CAPS capsule; Take 1 capsule (50,000 Units total) by mouth every 7 (seven) days.  Dispense: 12 capsule; Refill: 3 - CMP14+EGFR - CBC with Differential/Platelet - VITAMIN D 25 Hydroxy (Vit-D Deficiency, Fractures)  6. Depression, major, single episode, mild (HCC) Will increase Lexapro to 20 mg from 10 mg  Stress management  - CMP14+EGFR - CBC with Differential/Platelet - escitalopram (LEXAPRO) 20 MG tablet; Take 1 tablet (20 mg total) by mouth daily.  Dispense: 90 tablet; Refill: 4  7. Morbid obesity ([place]) - CMP14+EGFR - CBC with Differential/Platelet  8. Allergic rhinitis due to pollen, unspecified seasonality Start Allegra, stop zyrtec  10 mg  - CMP14+EGFR - CBC with Differential/Platelet - fexofenadine (ALLEGRA ALLERGY) 180 MG tablet; Take 1 tablet (180 mg total) by mouth daily.  Dispense: 90 tablet; Refill: 3   Labs pending Health Maintenance reviewed Diet and exercise encouraged  Follow up plan: 6 months    Evelina Dun, FNP

## 2020-10-05 LAB — CMP14+EGFR
ALT: 16 IU/L (ref 0–32)
AST: 28 IU/L (ref 0–40)
Albumin/Globulin Ratio: 1.2 (ref 1.2–2.2)
Albumin: 3.5 g/dL — ABNORMAL LOW (ref 3.8–4.8)
Alkaline Phosphatase: 81 IU/L (ref 44–121)
BUN/Creatinine Ratio: 5 — ABNORMAL LOW (ref 9–23)
BUN: 4 mg/dL — ABNORMAL LOW (ref 6–20)
Bilirubin Total: 0.5 mg/dL (ref 0.0–1.2)
CO2: 25 mmol/L (ref 20–29)
Calcium: 8.9 mg/dL (ref 8.7–10.2)
Chloride: 98 mmol/L (ref 96–106)
Creatinine, Ser: 0.81 mg/dL (ref 0.57–1.00)
Globulin, Total: 2.9 g/dL (ref 1.5–4.5)
Glucose: 92 mg/dL (ref 65–99)
Potassium: 4.6 mmol/L (ref 3.5–5.2)
Sodium: 139 mmol/L (ref 134–144)
Total Protein: 6.4 g/dL (ref 6.0–8.5)
eGFR: 96 mL/min/{1.73_m2} (ref >59)

## 2020-10-05 LAB — CBC WITH DIFFERENTIAL/PLATELET
Basophils Absolute: 0 10*3/uL (ref 0.0–0.2)
Basos: 0 %
EOS (ABSOLUTE): 0.1 10*3/uL (ref 0.0–0.4)
Eos: 1 %
Hematocrit: 35.2 % (ref 34.0–46.6)
Hemoglobin: 12.1 g/dL (ref 11.1–15.9)
Immature Grans (Abs): 0 10*3/uL (ref 0.0–0.1)
Immature Granulocytes: 0 %
Lymphocytes Absolute: 2.7 10*3/uL (ref 0.7–3.1)
Lymphs: 38 %
MCH: 35.8 pg — ABNORMAL HIGH (ref 26.6–33.0)
MCHC: 34.4 g/dL (ref 31.5–35.7)
MCV: 104 fL — ABNORMAL HIGH (ref 79–97)
Monocytes Absolute: 0.3 10*3/uL (ref 0.1–0.9)
Monocytes: 4 %
Neutrophils Absolute: 3.9 10*3/uL (ref 1.4–7.0)
Neutrophils: 57 %
Platelets: 240 10*3/uL (ref 150–450)
RBC: 3.38 x10E6/uL — ABNORMAL LOW (ref 3.77–5.28)
RDW: 13.7 % (ref 11.7–15.4)
WBC: 7.1 10*3/uL (ref 3.4–10.8)

## 2020-10-05 LAB — LIPID PANEL
Chol/HDL Ratio: 3 ratio (ref 0.0–4.4)
Cholesterol, Total: 203 mg/dL — ABNORMAL HIGH (ref 100–199)
HDL: 68 mg/dL (ref >39)
LDL Chol Calc (NIH): 114 mg/dL — ABNORMAL HIGH (ref 0–99)
Triglycerides: 120 mg/dL (ref 0–149)
VLDL Cholesterol Cal: 21 mg/dL (ref 5–40)

## 2020-10-05 LAB — TSH: TSH: 1.97 u[IU]/mL (ref 0.450–4.500)

## 2020-10-05 LAB — VITAMIN D 25 HYDROXY (VIT D DEFICIENCY, FRACTURES): Vit D, 25-Hydroxy: 47 ng/mL (ref 30.0–100.0)

## 2021-03-05 ENCOUNTER — Other Ambulatory Visit: Payer: Self-pay | Admitting: Family

## 2021-03-05 DIAGNOSIS — Z3009 Encounter for other general counseling and advice on contraception: Secondary | ICD-10-CM

## 2021-07-19 ENCOUNTER — Ambulatory Visit (HOSPITAL_COMMUNITY): Admission: RE | Admit: 2021-07-19 | Payer: Medicaid Other | Source: Ambulatory Visit

## 2021-07-19 ENCOUNTER — Ambulatory Visit: Payer: Medicaid Other | Admitting: Family Medicine

## 2021-07-19 ENCOUNTER — Encounter: Payer: Self-pay | Admitting: Family Medicine

## 2021-07-19 VITALS — BP 111/62 | HR 108 | Temp 97.1°F | Ht 63.0 in | Wt 251.4 lb

## 2021-07-19 DIAGNOSIS — R718 Other abnormality of red blood cells: Secondary | ICD-10-CM | POA: Diagnosis not present

## 2021-07-19 DIAGNOSIS — G8222 Paraplegia, incomplete: Secondary | ICD-10-CM

## 2021-07-19 DIAGNOSIS — R710 Precipitous drop in hematocrit: Secondary | ICD-10-CM | POA: Diagnosis not present

## 2021-07-19 DIAGNOSIS — K641 Second degree hemorrhoids: Secondary | ICD-10-CM | POA: Diagnosis not present

## 2021-07-19 DIAGNOSIS — G4452 New daily persistent headache (NDPH): Secondary | ICD-10-CM | POA: Diagnosis not present

## 2021-07-19 MED ORDER — HYDROCORTISONE ACETATE 25 MG RE SUPP: 25.0000 mg | Freq: Four times a day (QID) | RECTAL | 1 refills | Status: DC | PRN

## 2021-07-19 NOTE — Addendum Note (Signed)
Addended by: Adella Hare B on: 07/19/2021 10:24 AM ? ? Modules accepted: Orders ? ?

## 2021-07-19 NOTE — Progress Notes (Addendum)
? ?Subjective:  ?Patient ID: Kristin Ballard, female    DOB: 1985/12/30  Age: 36 y.o. MRN: [id number] ? ?CC: Extremity Weakness, Fall, and Hemorrhoids ? ? ?HPI ?Kristin Ballard presents for legs being weak and shaky. [place] three times last weekend. Can't get up. Can't stand from seated position. Walking is "hobbling." She is very unsteady. NKI. Feels pressure in the middle of her spine as she walks.  ?She is having daily frontal HA that radiates to the jaws with pain to 7/10. It is bilateral. Onset at the same time as the leg weakness & pain.  ? ?Has muscle pain with walking. 5/10 builds as she walks. Goes to 8-9/10.  ? ? ?Hemorrhoidal pain to 8/10 with BM. Bleeding frequently. Onset 1-2 weeks ago.  ? ?Onset was sudden 2 weeks ago. Awoke with stiff sore muscles one morning. HAs progressed since then.  ? ?Hands go to sleep at times, go numb.  ? ? ?  07/19/2021  ?  9:12 AM 10/04/2020  ?  2:19 PM 09/15/2018  ? 12:07 PM  ?Depression screen PHQ 2/9  ?Decreased Interest 1 2 0  ?Down, Depressed, Hopeless 1 0 0  ?PHQ - 2 Score 2 2 0  ?Altered sleeping 2 2   ?Tired, decreased energy 2 1   ?Change in appetite 2 2   ?Feeling bad or failure about yourself  2 0   ?Trouble concentrating 0 1   ?Moving slowly or fidgety/restless 0 0   ?Suicidal thoughts 0 0   ?PHQ-9 Score 10 8   ?Difficult doing work/chores Not difficult at all Somewhat difficult   ? ? ?History ?Taralyn has a past medical history of Allergy, Functional ovarian cysts, and Migraines.  ? ?She has a past surgical history that includes [place] tooth extraction (2011).  ? ?Her family history includes Cancer in her father; Depression in her mother; Diabetes in her father.She reports that she has been smoking cigarettes. She has been smoking an average of .5 packs per day. She has never used smokeless tobacco. She reports current drug use. Drug: Marijuana. She reports that she does not drink alcohol. ? ? ? ?ROS ?Review of Systems  ?Constitutional:  Positive for activity change,  fatigue and fever (body feels hot).  ?HENT: Negative.    ?Eyes:  Negative for pain and visual disturbance.  ?Respiratory:  Negative for shortness of breath.   ?Cardiovascular:  Negative for chest pain.  ?Gastrointestinal:  Negative for abdominal pain.  ?Musculoskeletal:  Negative for arthralgias.  ?Neurological:  Positive for weakness (BLE, bilaterally symmetric).  ?Psychiatric/Behavioral:  The patient is nervous/anxious.   ? ?Objective:  ?BP 111/62   Pulse (!) 108   Temp (!) 97.1 ?F (36.2 ?C)   Ht 5' 3"  (1.6 m)   Wt 251 lb 6.4 oz (114 kg)   SpO2 100%   BMI 44.53 kg/m?  ? ?BP Readings from Last 3 Encounters:  ?07/19/21 111/62  ?10/04/20 119/86  ?10/27/19 111/80  ? ? ?Wt Readings from Last 3 Encounters:  ?07/19/21 251 lb 6.4 oz (114 kg)  ?10/04/20 248 lb 6.4 oz (112.7 kg)  ?10/27/19 254 lb 6.4 oz (115.4 kg)  ? ? ? ?Physical Exam ?Constitutional:   ?   General: She is not in acute distress. ?   Appearance: She is well-developed.  ?HENT:  ?   Head: Normocephalic and atraumatic.  ?Eyes:  ?   Conjunctiva/sclera: Conjunctivae normal.  ?   Pupils: Pupils are equal, round, and reactive to light.  ?Neck:  ?  Thyroid: No thyromegaly.  ?Cardiovascular:  ?   Rate and Rhythm: Normal rate and regular rhythm.  ?   Heart sounds: Normal heart sounds. No murmur heard. ?Pulmonary:  ?   Effort: Pulmonary effort is normal. No respiratory distress.  ?   Breath sounds: Normal breath sounds. No wheezing or rales.  ?Abdominal:  ?   General: Bowel sounds are normal. There is no distension.  ?   Palpations: Abdomen is soft.  ?   Tenderness: There is no abdominal tenderness.  ?Musculoskeletal:     ?   General: No deformity.  ?   Cervical back: Normal range of motion and neck supple.  ?   Comments: BLE weak for flexion extension of hips knees, ankles and feet. Gait is slow, unsteady, Trouble placing feet in front of her. They tend to "flop" into place ?Stength is 1-2/5 for flexion. Marked tremor with movement. ?Wheelchair in use in  office  ?Lymphadenopathy:  ?   Cervical: No cervical adenopathy.  ?Skin: ?   General: Skin is warm and dry.  ?Neurological:  ?   Mental Status: She is alert and oriented to person, place, and time.  ?Psychiatric:     ?   Behavior: Behavior normal.     ?   Thought Content: Thought content normal.     ?   Judgment: Judgment normal.  ? ? ? ? ?Assessment & Plan:  ? ?Axel was seen today for extremity weakness, fall and hemorrhoids. ? ?Diagnoses and all orders for this visit: ? 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 ?Grade II hemorrhoids ? ? ? ?New onset, possible brain lesion vs spinal lesion. Possible MS.  ? ? ?I am having Kristin Ballard maintain her omeprazole, Vitamin D (Ergocalciferol), fexofenadine, escitalopram, and Sprintec 28. ? ?Allergies as of 07/19/2021   ? ?   Reactions  ? Latex   ? ?  ? ?  ?Medication List  ?  ? ?  ? Accurate as of July 19, 2021 10:11 AM. If you have any questions, ask your nurse or doctor.  ?  ?  ? ?  ? ?escitalopram 20 MG tablet ?Commonly known as: Lexapro ?Take 1 tablet (20 mg total) by mouth daily. ?  ?fexofenadine 180 MG tablet ?Commonly known as: Allegra Allergy ?Take 1 tablet (180 mg total) by mouth daily. ?  ?omeprazole 20 MG capsule ?Commonly known as: PRILOSEC ?Take 1 capsule (20 mg total) by mouth daily. ?  ?Sprintec 28 0.25-35 MG-MCG tablet ?Generic drug: norgestimate-ethinyl estradiol ?Take 1 tablet by mouth once daily ?  ?Vitamin D (Ergocalciferol) 1.25 MG (50000 UNIT) Caps capsule ?Commonly known as: DRISDOL ?Take 1 capsule (50,000 Units total) by mouth every 7  (seven) days. ?  ? ?  ? ? ? ?Follow-up: Return in about 2 weeks (around 08/02/2021). ? ?Claretta Fraise, M.D. ?

## 2021-07-20 ENCOUNTER — Ambulatory Visit (HOSPITAL_COMMUNITY)
Admission: RE | Admit: 2021-07-20 | Discharge: 2021-07-20 | Disposition: A | Discharge status: Home / Self Care | Payer: Medicaid Other | Source: Ambulatory Visit | Attending: Family Medicine | Admitting: Family Medicine

## 2021-07-20 DIAGNOSIS — G4452 New daily persistent headache (NDPH): Secondary | ICD-10-CM | POA: Insufficient documentation

## 2021-07-20 DIAGNOSIS — G8222 Paraplegia, incomplete: Secondary | ICD-10-CM | POA: Diagnosis not present

## 2021-07-20 DIAGNOSIS — R519 Headache, unspecified: Secondary | ICD-10-CM | POA: Diagnosis not present

## 2021-07-20 IMAGING — MR MR HEAD WO/W CM
7 of 13 series · 21 of 48 positions shown · IV contrast (gadavist)
Comparison: None.

CLINICAL DATA: Headache, new or worsening, neuro deficit (Age
18-49y) BLE have paresis

EXAM:
MRI HEAD WITHOUT AND WITH CONTRAST
TECHNIQUE: Multiplanar, multiecho pulse sequences of the brain and surrounding
structures were obtained without and with intravenous contrast.
CONTRAST:  10mL GADAVIST GADOBUTROL 1 MMOL/ML IV SOLN

[Series 2: DWI · axial · 3.0mm · 0.94mm/px · z∈[-93,+48]mm · 6 of 99 slices shown (1 of 2)]
[im 1/99]
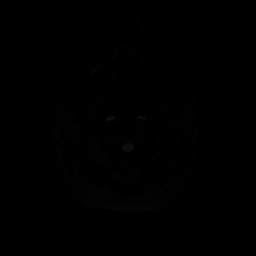
[im 20/99]
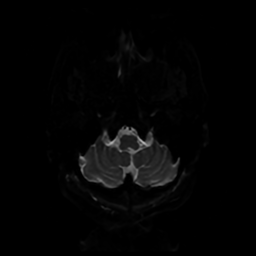
[im 40/99]
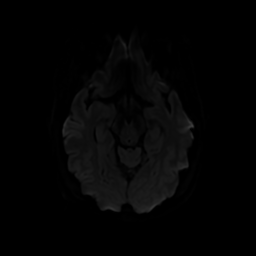
[im 59/99]
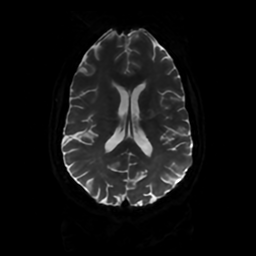
[im 79/99]
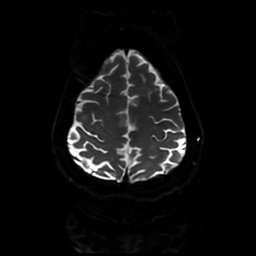
[im 99/99]
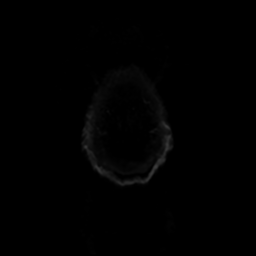

[Series 3: DWI · coronal · 4.0mm · 0.94mm/px · 5 of 74 slices shown (2 of 2)]
[im 1/74]
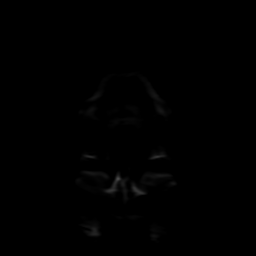
[im 19/74]
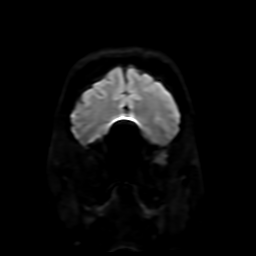
[im 37/74]
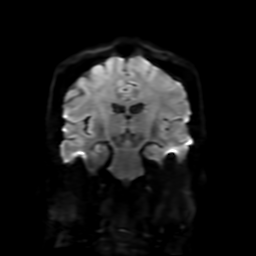
[im 55/74]
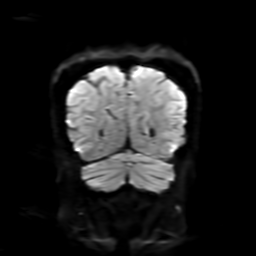
[im 74/74]
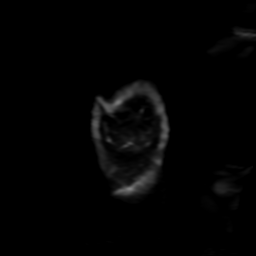

[Series 4: FLAIR · sagittal · 5.0mm · 0.23mm/px · 2 of 23 slices shown (1 of 2)]
[im 1/23]
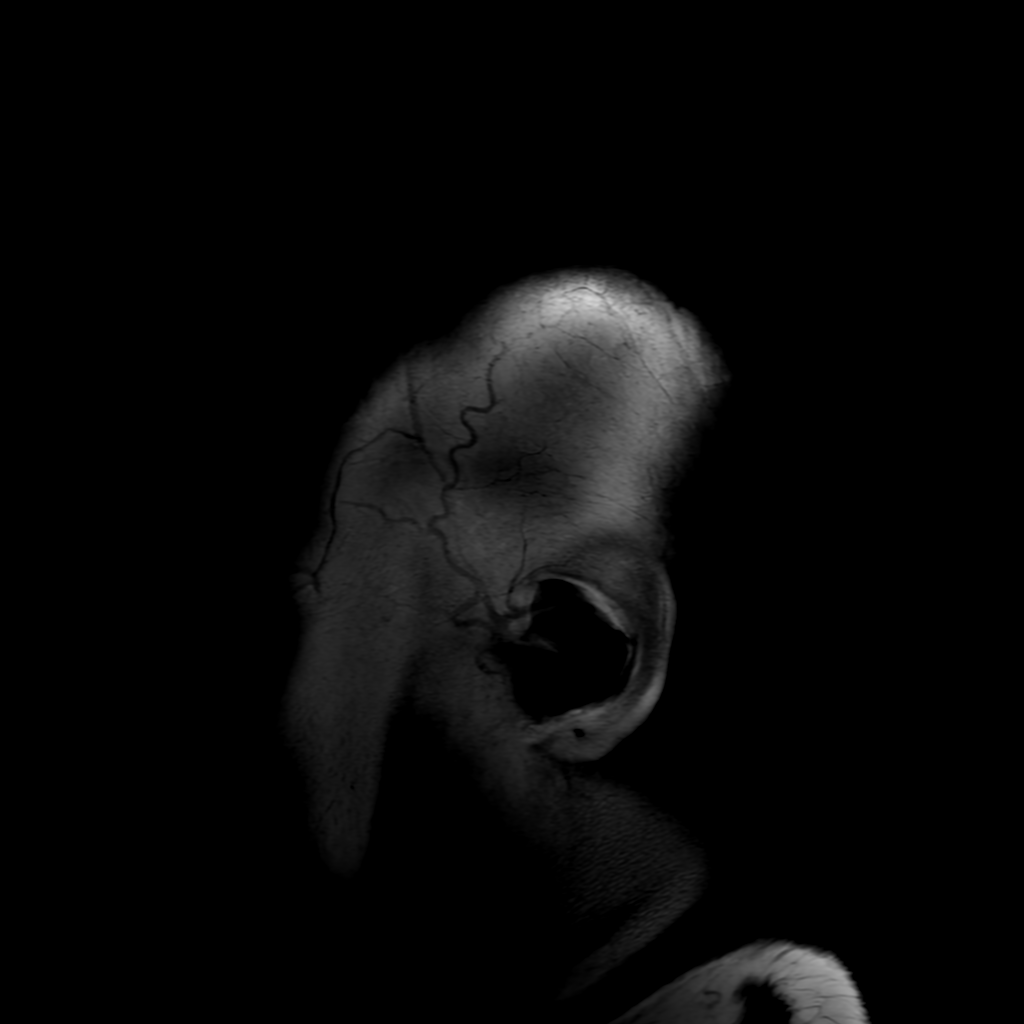
[im 23/23]
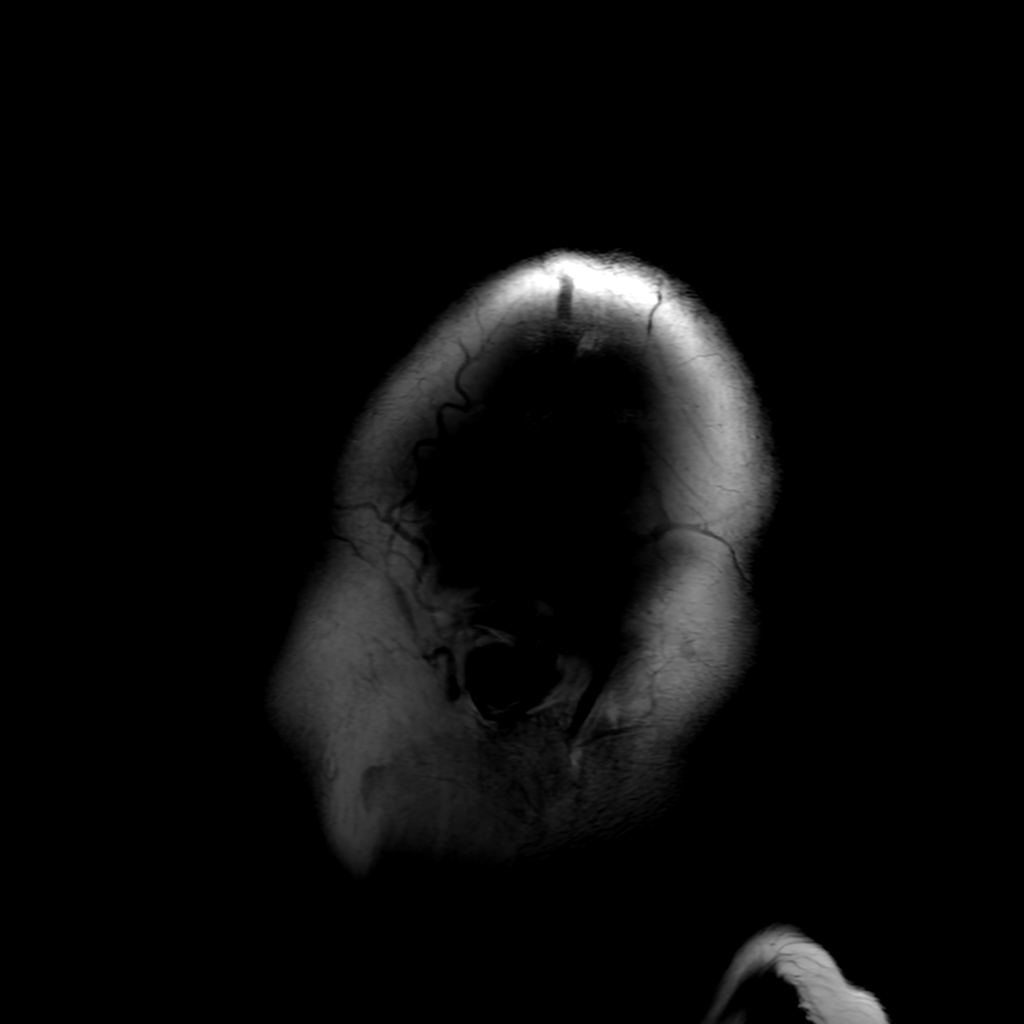

[Series 5: T2 · axial · 5.0mm · 0.23mm/px · 1 of 26 slices shown]
[im 1/26]
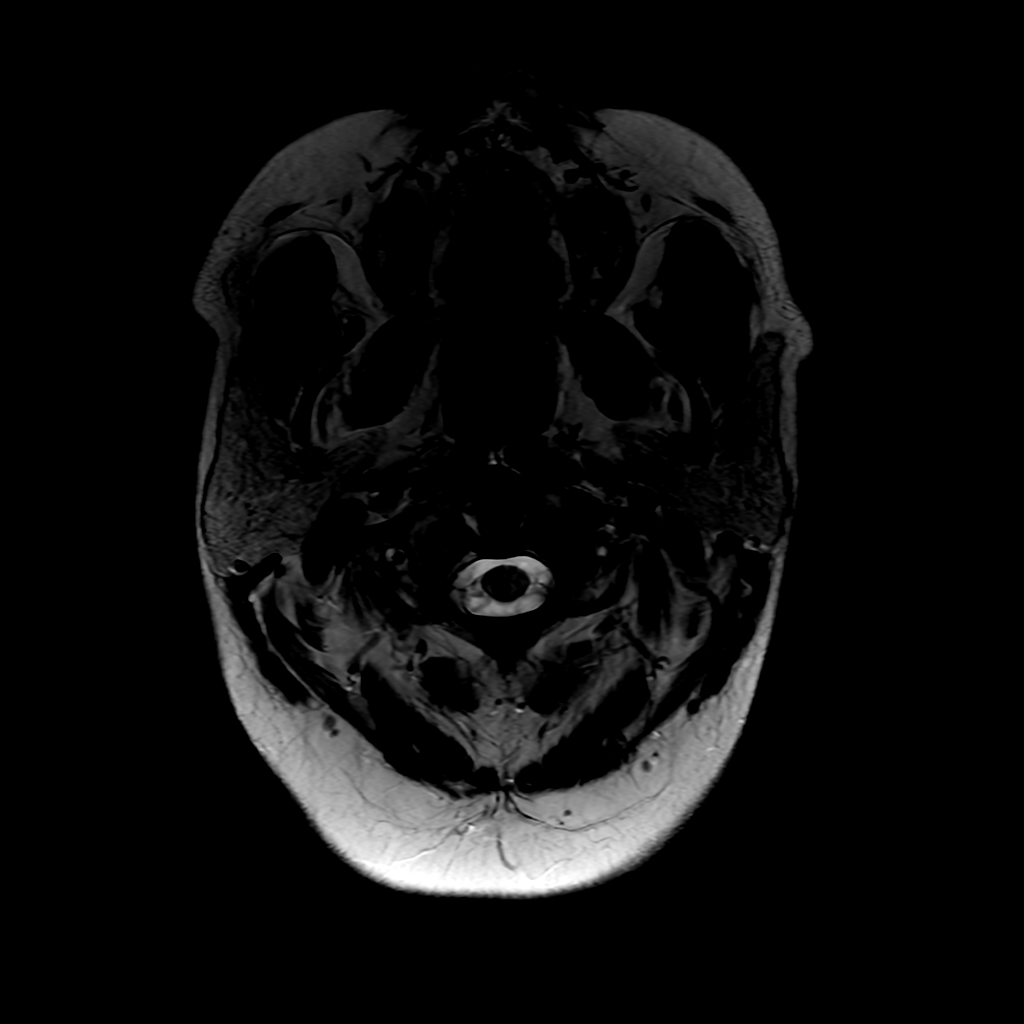

[Series 6: FLAIR · axial · 4.0mm · 0.45mm/px · z∈[-89,+56]mm · 2 of 35 slices shown (2 of 2)]
[im 1/35]
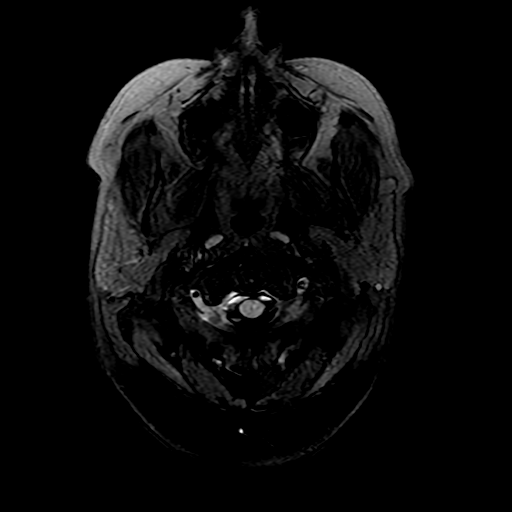
[im 35/35]
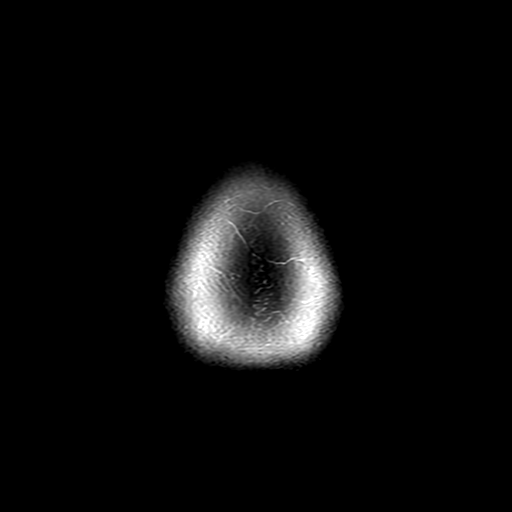

[Series 250: ADC · axial · 3.0mm · 0.94mm/px · z∈[-93,+48]mm · 3 of 50 slices shown (1 of 2)]
[im 1/50]
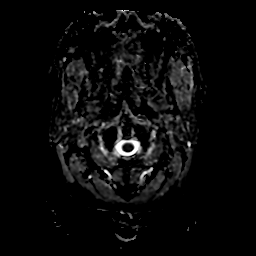
[im 25/50]
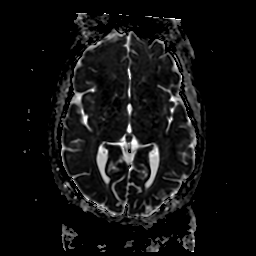
[im 50/50]
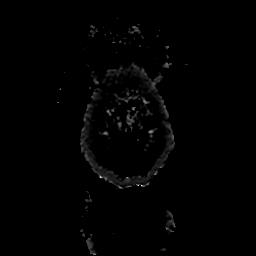

[Series 350: ADC · coronal · 4.0mm · 0.94mm/px · 2 of 36 slices shown (2 of 2)]
[im 1/36]
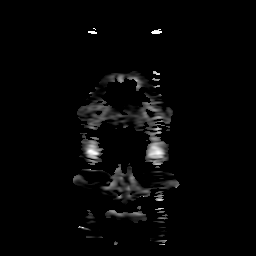
[im 36/36]
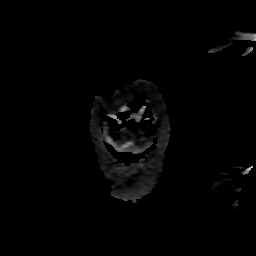

[21 of 48 positions shown; findings below may reference images not displayed]

FINDINGS: Brain: No acute infarction, hemorrhage, hydrocephalus, extra-axial
collection or mass lesion. No pathologic enhancement.

Vascular: Major arterial flow voids are maintained at the skull
base.

Skull and upper cervical spine: Normal marrow signal.

Sinuses/Orbits: Clear sinuses.  No acute orbital findings.

Other: Small mastoid effusions.
IMPRESSION: Normal brain MRI.  No acute abnormality.

## 2021-07-20 MED ORDER — GADOBUTROL 1 MMOL/ML IV SOLN
10.0000 mL | Freq: Once | INTRAVENOUS | Status: AC | PRN
  Administered 2021-07-20: 10 mL via INTRAVENOUS

## 2021-07-21 ENCOUNTER — Other Ambulatory Visit: Payer: Self-pay | Admitting: Family Medicine

## 2021-07-21 ENCOUNTER — Other Ambulatory Visit: Payer: Self-pay | Admitting: *Deleted

## 2021-07-21 DIAGNOSIS — E876 Hypokalemia: Secondary | ICD-10-CM

## 2021-07-21 LAB — CBC WITH DIFFERENTIAL/PLATELET
Basophils Absolute: 0 10*3/uL (ref 0.0–0.2)
Basos: 0 %
EOS (ABSOLUTE): 0 10*3/uL (ref 0.0–0.4)
Eos: 1 %
Hematocrit: 27 % — ABNORMAL LOW (ref 34.0–46.6)
Hemoglobin: 9.1 g/dL — ABNORMAL LOW (ref 11.1–15.9)
Immature Grans (Abs): 0 10*3/uL (ref 0.0–0.1)
Immature Granulocytes: 1 %
Lymphocytes Absolute: 1.7 10*3/uL (ref 0.7–3.1)
Lymphs: 27 %
MCH: 35.4 pg — ABNORMAL HIGH (ref 26.6–33.0)
MCHC: 33.7 g/dL (ref 31.5–35.7)
MCV: 105 fL — ABNORMAL HIGH (ref 79–97)
Monocytes Absolute: 0.3 10*3/uL (ref 0.1–0.9)
Monocytes: 5 %
NRBC: 1 % — ABNORMAL HIGH (ref 0–0)
Neutrophils Absolute: 4.2 10*3/uL (ref 1.4–7.0)
Neutrophils: 66 %
Platelets: 250 10*3/uL (ref 150–450)
RBC: 2.57 x10E6/uL — CL (ref 3.77–5.28)
RDW: 14.5 % (ref 11.7–15.4)
WBC: 6.3 10*3/uL (ref 3.4–10.8)

## 2021-07-21 LAB — CMP14+EGFR
ALT: 16 IU/L (ref 0–32)
AST: 29 IU/L (ref 0–40)
Albumin/Globulin Ratio: 1.2 (ref 1.2–2.2)
Albumin: 3.1 g/dL — ABNORMAL LOW (ref 3.8–4.8)
Alkaline Phosphatase: 85 IU/L (ref 44–121)
BUN/Creatinine Ratio: 3 — ABNORMAL LOW (ref 9–23)
BUN: 4 mg/dL — ABNORMAL LOW (ref 6–20)
Bilirubin Total: 0.7 mg/dL (ref 0.0–1.2)
CO2: 18 mmol/L — ABNORMAL LOW (ref 20–29)
Calcium: 8.2 mg/dL — ABNORMAL LOW (ref 8.7–10.2)
Chloride: 92 mmol/L — ABNORMAL LOW (ref 96–106)
Creatinine, Ser: 1.16 mg/dL — ABNORMAL HIGH (ref 0.57–1.00)
Globulin, Total: 2.5 g/dL (ref 1.5–4.5)
Glucose: 111 mg/dL — ABNORMAL HIGH (ref 70–99)
Potassium: 2.9 mmol/L — ABNORMAL LOW (ref 3.5–5.2)
Sodium: 135 mmol/L (ref 134–144)
Total Protein: 5.6 g/dL — ABNORMAL LOW (ref 6.0–8.5)
eGFR: 62 mL/min/{1.73_m2} (ref >59)

## 2021-07-21 LAB — FOLATE: Folate: 2.1 ng/mL — ABNORMAL LOW (ref >3.0)

## 2021-07-21 LAB — SPECIMEN STATUS REPORT

## 2021-07-21 LAB — SYSTEMIC LUPUS PROFILE B
Anti Nuclear Antibody (ANA): NEGATIVE
Chromatin Ab SerPl-aCnc: <0.2 AI (ref 0.0–0.9)
Complement C3, Serum: 95 mg/dL (ref 82–167)
Complement C4, Serum: 21 mg/dL (ref 12–38)
dsDNA Ab: <1 IU/mL (ref 0–9)

## 2021-07-21 LAB — NEUROMYELITIS OPTICA AUTOAB, IGG: NMO IgG Autoantibodies: <1.5 U/mL (ref 0.0–3.0)

## 2021-07-21 LAB — VITAMIN B12: Vitamin B-12: 572 pg/mL (ref 232–1245)

## 2021-07-21 LAB — SEDIMENTATION RATE: Sed Rate: 7 mm/hr (ref 0–32)

## 2021-07-21 MED ORDER — POTASSIUM CHLORIDE CRYS ER 20 MEQ PO TBCR: 20.0000 meq | EXTENDED_RELEASE_TABLET | Freq: Every day | ORAL | 3 refills | Status: DC

## 2021-07-23 NOTE — Progress Notes (Addendum)
? ?NEUROLOGY CONSULTATION NOTE ? ?[name] ?MRN: [id number] ?DOB: 1986/02/26 ? ?Referring provider: Claretta Fraise, MD ?Primary care provider: Evelina Dun, FNP ? ?Reason for consult:  bilateral leg weakness, headache ? ?Assessment/Plan:  ? 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  She did have low potassium which may cause weakness but she is currently on supplementation. ? ?Will order STAT LP to assess most importantly CSF protein, as well as cell count, glucose, gram stain and culture ?For pain, start gabapentin 100mg  three times daily titrating to 300mg  three times daily ?Further recommendations pending results.  If protein elevated, will likely start patient on IVIg ? ? ? ? ?Subjective:  ?Kristin Ballard is a 36 year old female who presents for bilateral leg weakness and headaches.  History supplemented by referring provider's note. ? ?About 2 and 1/2 weeks ago, she woke up with bilateral lower extremity soreness.  She noted numbness and tingling in her toes and finger tips.  Over the next several days, then pain got worse.  She started experiencing weakness.  When she stands, her legs would just give out.  She has pain, numbness and tingling in the lateral thighs.  She has numbness in her toes and shins.  She has midline low back pain  Denies neck pain.  She has tingling in her fingertips but denies weakness or radicular pain in the upper extremities.  Denies bowel or bladder dysfunction.  Currently in a wheelchair.  Sometimes noted blurred vision but no diplopia.  No shortness of breath.  Denies preceding injury.  She did have some upper respiratory symptoms (cough, congestion) but thought it was allergies. ? ?Blood work from 07/19/2021 included negative ANA, C3, C4 and dsDNA abs; sed rate 7; B12 572, low folate 2.1; negative NMO IgG; CBC with low Hgb/HCT 9.1/27 and MCV 105; and CMP with low K+ 2.9.   She was started on KCl.  MRI of brain with and without contrast on 07/20/2021 personally reviewed was normal.   ? ? ? ?PAST MEDICAL HISTORY: ?Past Medical History:  ?Diagnosis Date  ? Allergy   ? Functional ovarian cysts   ? Migraines   ? ? ?PAST SURGICAL HISTORY: ?Past Surgical History:  ?Procedure Laterality Date  ? [place] EXTRACTION  2011  ? ? ?MEDICATIONS: ?Current Outpatient Medications on File Prior to Visit  ?Medication Sig Dispense Refill  ? potassium chloride SA (KLOR-CON M) 20 MEQ tablet Take 1 tablet (20 mEq total) by mouth daily. As a potassium supplement 30 tablet 3  ? escitalopram (LEXAPRO) 20 MG tablet Take 1 tablet (20 mg total) by mouth daily. (Patient not taking: Reported on 07/19/2021) 90 tablet 4  ? fexofenadine (ALLEGRA ALLERGY) 180 MG tablet Take 1 tablet (180 mg total) by mouth daily. 90 tablet 3  ? hydrocortisone (ANUSOL-HC) 25 MG suppository Place 1 suppository (25 mg total) rectally 4 (four) times daily as needed for hemorrhoids. 20 suppository 1  ? omeprazole (PRILOSEC) 20 MG capsule Take 1 capsule (20 mg total) by mouth daily. 90 capsule 4  ? SPRINTEC 28 0.25-35 MG-MCG tablet Take 1 tablet by mouth once daily 84 tablet 1  ? Vitamin D, Ergocalciferol, (DRISDOL) 1.25 MG (50000 UNIT) CAPS capsule Take 1 capsule (50,000 Units total) by mouth every 7 (seven) days. 12 capsule 3  ? ?No current facility-administered medications on file prior to visit.  ? ? ?ALLERGIES: ?Allergies  ?Allergen Reactions  ? Latex   ? ? ?  FAMILY HISTORY: ?Family History  ?Problem Relation Age of Onset  ? Depression Mother   ? Diabetes Father   ? Cancer Father   ?     melanoma  ? ? ?Objective:  ?Blood pressure 101/67, pulse (!) 113, height 5\' 3"  (1.6 m), weight 251 lb (113.9 kg), SpO2 100 %. ?General: No acute distress.  Patient appears well-groomed.   ?Head:  Normocephalic/atraumatic ?Eyes:  fundi examined but not visualized ?Neck: supple, no paraspinal tenderness, full range of motion ?Back: No paraspinal  tenderness ?Heart: regular rate and rhythm ?Lungs: Clear to auscultation bilaterally. ?Vascular: No carotid bruits. ?Neurological Exam: ?Mental status: alert and oriented to person, place, and time,  speech fluent and not dysarthric, language intact. ?Cranial nerves: ?CN I: not tested ?CN II: pupils equal, round and reactive to light, visual fields intact ?CN III, IV, VI:  full range of motion, no nystagmus, no ptosis ?CN V: facial sensation intact. ?CN VII: upper and lower face symmetric ?CN VIII: hearing intact ?CN IX, X: gag intact, uvula midline ?CN XI: sternocleidomastoid and trapezius muscles intact ?CN XII: tongue midline ?Bulk & Tone: normal, no fasciculations. ?Motor:  muscle strength 4/5 bilateral AHL and 5-/5 bilateral plantarflexion; otherwise 5/5 throughout ?Sensation:  Pinprick sensation reduced in lower extremities up to right ankle but patchy sensory loss up to below right knee, and up to left mid-shin.  Vibratory sensation intact ?Deep Tendon Reflexes:  1+ upper extremities, absent lower extremities;  toes downgoing.   ?Finger to nose testing:  Without dysmetria.   ?Heel to shin:  Without dysmetria.   ?Gait:  Must use upper extremities to push up to stand.  Broad-based stance.  Ataxic.   ? ? ? ?Thank you for allowing me to take part in the care of this patient. ? ?Metta Clines, DO ? ?CC:  Claretta Fraise, MD ? Evelina Dun, FNP ? ? ? ? ?

## 2021-07-24 ENCOUNTER — Encounter: Payer: Self-pay | Admitting: Neurology

## 2021-07-24 ENCOUNTER — Other Ambulatory Visit (INDEPENDENT_AMBULATORY_CARE_PROVIDER_SITE_OTHER): Payer: Medicaid Other

## 2021-07-24 ENCOUNTER — Other Ambulatory Visit: Payer: Self-pay | Admitting: Family Medicine

## 2021-07-24 ENCOUNTER — Ambulatory Visit: Payer: Medicaid Other | Admitting: Neurology

## 2021-07-24 VITALS — BP 101/67 | HR 113 | Ht 63.0 in | Wt 251.0 lb

## 2021-07-24 DIAGNOSIS — R202 Paresthesia of skin: Secondary | ICD-10-CM

## 2021-07-24 DIAGNOSIS — E876 Hypokalemia: Secondary | ICD-10-CM | POA: Diagnosis not present

## 2021-07-24 DIAGNOSIS — R29898 Other symptoms and signs involving the musculoskeletal system: Secondary | ICD-10-CM | POA: Diagnosis not present

## 2021-07-24 DIAGNOSIS — R200 Anesthesia of skin: Secondary | ICD-10-CM | POA: Diagnosis not present

## 2021-07-24 LAB — BASIC METABOLIC PANEL
BUN: 7 mg/dL (ref 6–23)
CO2: 24 mEq/L (ref 19–32)
Calcium: 8.2 mg/dL — ABNORMAL LOW (ref 8.4–10.5)
Chloride: 95 mEq/L — ABNORMAL LOW (ref 96–112)
Creatinine, Ser: 1.18 mg/dL (ref 0.40–1.20)
GFR: 58.76 mL/min — ABNORMAL LOW (ref >60.00)
Glucose, Bld: 107 mg/dL — ABNORMAL HIGH (ref 70–99)
Potassium: 3.1 mEq/L — ABNORMAL LOW (ref 3.5–5.1)
Sodium: 134 mEq/L — ABNORMAL LOW (ref 135–145)

## 2021-07-24 MED ORDER — FOLIC ACID 1 MG PO TABS: 1.0000 mg | ORAL_TABLET | Freq: Every day | ORAL | 3 refills | Status: DC

## 2021-07-24 MED ORDER — GABAPENTIN 100 MG PO CAPS: ORAL_CAPSULE | ORAL | 0 refills | Status: DC

## 2021-07-24 NOTE — Patient Instructions (Addendum)
I am concerned that you have a neuropathy, a nerve disease that is causing weakness and numbness.   ? ?We will get a nerve conduction study to evaluate for neuropathy ?Based on results, may need to order a spinal tap to test the spinal fluid for abnormalities consistent with a neuropathy ?If we confirm diagnosis, then we would treat with an IV medication ?For pain, start gabapentin and titrate up as instructed on the bottle. ?Further recommendations pending results. ?Your provider has requested that you have labwork completed today. The lab is located on the Second floor at [address], within the [facility] office. When you get off the elevator, turn right and go in the [facility] [address]; the first brown door on the left.  Tell the ladies behind the desk that you are there for lab work. If you are not called within 15 minutes please check with the front desk.  ? ?Once you complete your labs you are free to go. You will receive a call or message via MyChart with your lab results.    ?

## 2021-07-24 NOTE — Progress Notes (Signed)
The folic acid level is very low.  This could contribute to the symptoms that you had while in the office.  I sent in a prescription for folic acid.  It is a B vitamin and since it is a vitamin it it is not going to be covered by your insurance.  However it is still very important to take the medication. ?

## 2021-07-25 ENCOUNTER — Other Ambulatory Visit: Payer: Self-pay

## 2021-07-25 DIAGNOSIS — R29898 Other symptoms and signs involving the musculoskeletal system: Secondary | ICD-10-CM

## 2021-07-25 DIAGNOSIS — R200 Anesthesia of skin: Secondary | ICD-10-CM

## 2021-07-25 NOTE — Progress Notes (Unsigned)
Per Dr.Jaffe please cancel the STAT EMG and go ahead and schedule a STAT LP. ? ?Will send the LP to [facility]. STAT. ? ?Telephone call to [facility], Order for STAT EMG canceled. ?

## 2021-07-26 ENCOUNTER — Telehealth: Payer: Self-pay

## 2021-07-26 NOTE — Telephone Encounter (Signed)
Advised pt, if she is feeling weaker to please go to the ED. Since the LP not scheduled until 08/01/21.  ?

## 2021-08-01 ENCOUNTER — Ambulatory Visit
Admission: RE | Admit: 2021-08-01 | Discharge: 2021-08-01 | Disposition: A | Discharge status: Home / Self Care | Payer: Medicaid Other | Source: Ambulatory Visit | Attending: Neurology | Admitting: Neurology

## 2021-08-01 VITALS — BP 131/86 | HR 99

## 2021-08-01 DIAGNOSIS — S82832A Other fracture of upper and lower end of left fibula, initial encounter for closed fracture: Secondary | ICD-10-CM | POA: Diagnosis not present

## 2021-08-01 DIAGNOSIS — S82402A Unspecified fracture of shaft of left fibula, initial encounter for closed fracture: Secondary | ICD-10-CM | POA: Diagnosis not present

## 2021-08-01 DIAGNOSIS — R200 Anesthesia of skin: Secondary | ICD-10-CM | POA: Diagnosis not present

## 2021-08-01 DIAGNOSIS — R29898 Other symptoms and signs involving the musculoskeletal system: Secondary | ICD-10-CM

## 2021-08-01 DIAGNOSIS — M7989 Other specified soft tissue disorders: Secondary | ICD-10-CM | POA: Diagnosis not present

## 2021-08-01 DIAGNOSIS — W1839XA Other fall on same level, initial encounter: Secondary | ICD-10-CM | POA: Diagnosis not present

## 2021-08-01 DIAGNOSIS — M25572 Pain in left ankle and joints of left foot: Secondary | ICD-10-CM | POA: Diagnosis not present

## 2021-08-01 DIAGNOSIS — R202 Paresthesia of skin: Secondary | ICD-10-CM | POA: Diagnosis not present

## 2021-08-01 DIAGNOSIS — Z0389 Encounter for observation for other suspected diseases and conditions ruled out: Secondary | ICD-10-CM | POA: Diagnosis not present

## 2021-08-01 IMAGING — XA DG SPINAL PUNCT LUMBAR DIAG WITH FL CT GUIDANCE
2 series · 2 of 2 positions shown · non-contrast
Comparison: MRI of the head [DATE]

CLINICAL DATA: 36-year-old female with clinical concern for
demyelinating process. She presents for lumbar puncture.

EXAM:
DIAGNOSTIC LUMBAR PUNCTURE UNDER FLUOROSCOPIC GUIDANCE

[Series 1: ortho adipose · 1 of 1 slices shown (1 of 2)]
[im 1/1]
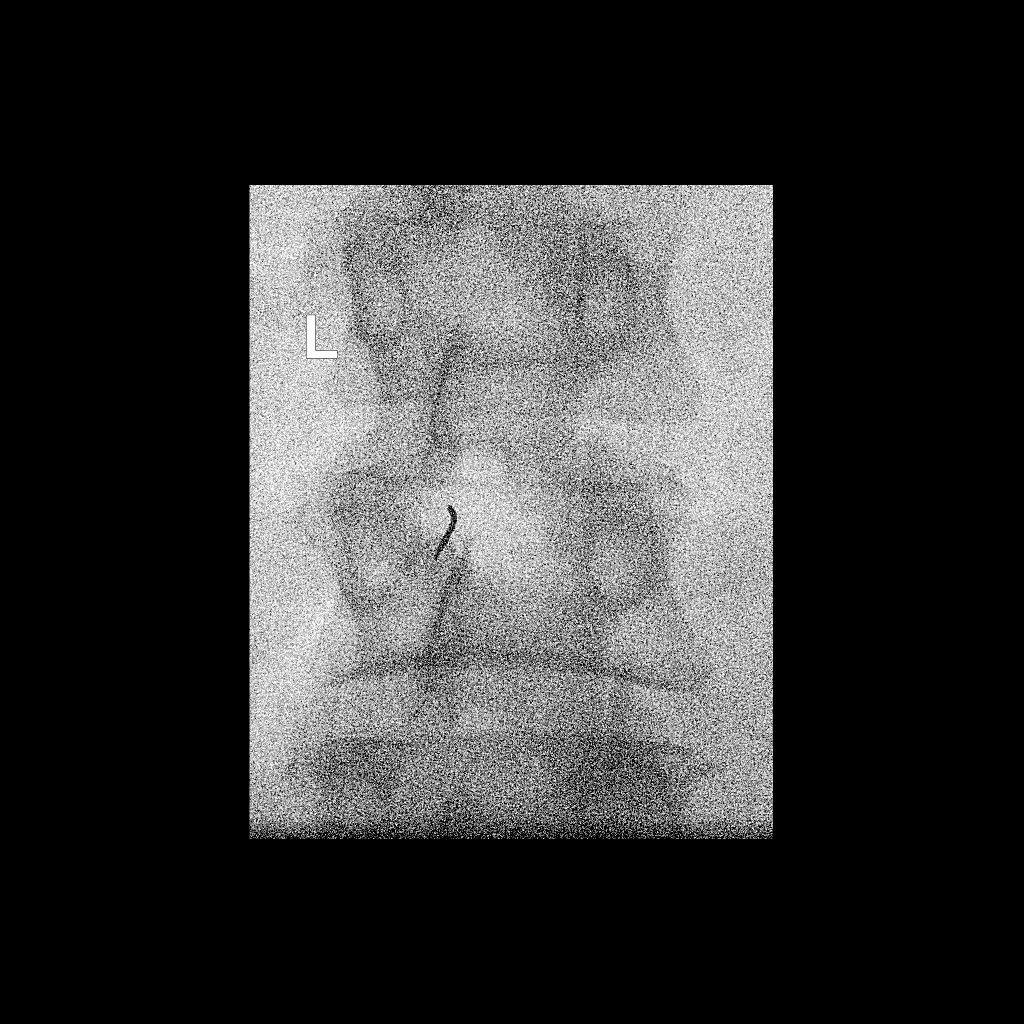

[Series 2: ortho adipose · 1 of 1 slices shown (2 of 2)]
[im 1/1]
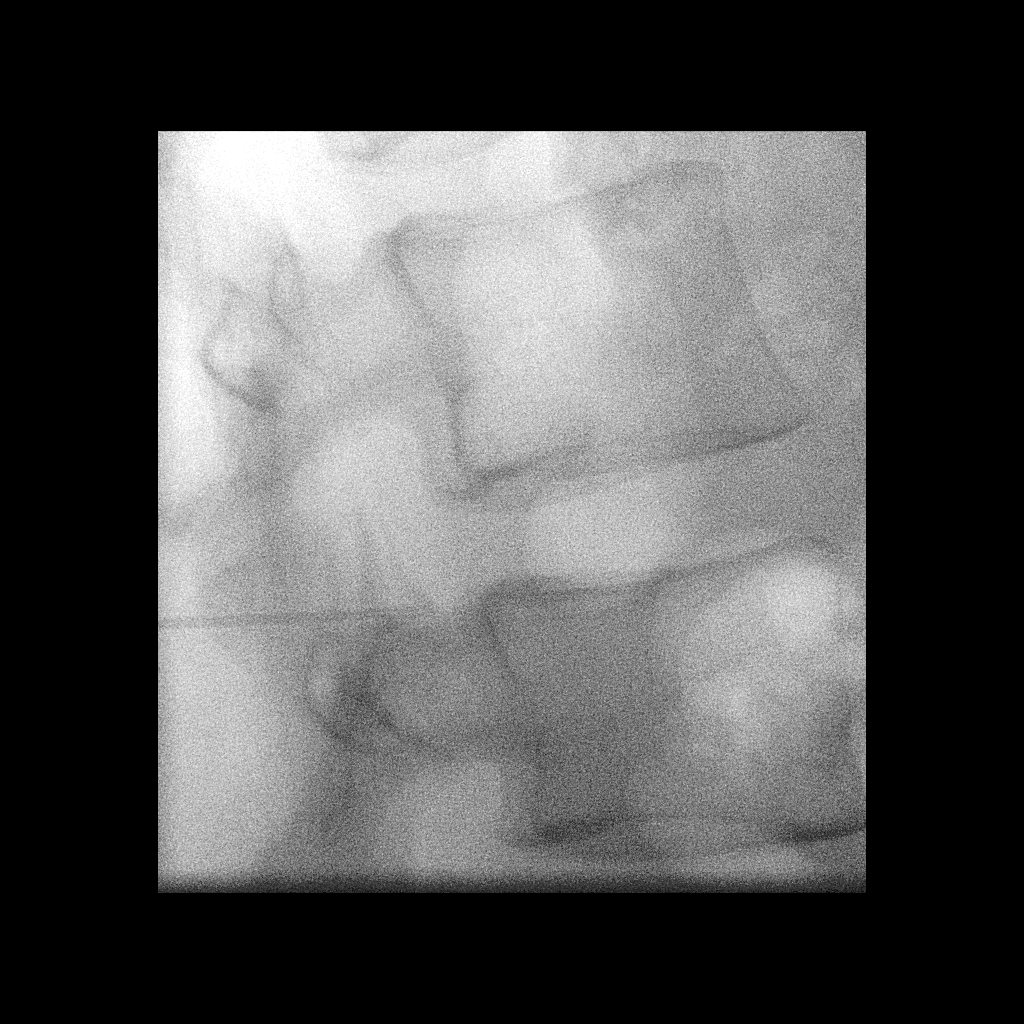

[2 of 2 positions shown; findings below may reference images not displayed]

FLUOROSCOPY:
Radiation Exposure Index (as provided by the fluoroscopic device):
4.8 mGy Kerma

PROCEDURE:
Informed consent was obtained from the patient prior to the
procedure, including potential complications of headache, allergy,
and pain. With the patient prone, the lower back was prepped with
Betadine. 1% Lidocaine was used for local anesthesia. Lumbar
puncture was performed at the L2-L3 level using a 22 gauge needle
with return of clear CSF with normal opening pressure. Six ml of CSF
were obtained for laboratory studies. The patient tolerated the
procedure well and there were no apparent complications.
IMPRESSION: Successful L2-L3 lumbar puncture yielding clear CSF and a normal
opening pressure.

## 2021-08-01 NOTE — Discharge Instructions (Signed)

## 2021-08-08 ENCOUNTER — Ambulatory Visit: Payer: Medicaid Other | Admitting: Family

## 2021-08-08 ENCOUNTER — Encounter: Payer: Self-pay | Admitting: Family

## 2021-08-08 VITALS — BP 111/76 | HR 106 | Temp 97.6°F | Resp 16 | Ht 63.0 in | Wt 251.0 lb

## 2021-08-08 DIAGNOSIS — R29898 Other symptoms and signs involving the musculoskeletal system: Secondary | ICD-10-CM | POA: Diagnosis not present

## 2021-08-08 DIAGNOSIS — S82832D Other fracture of upper and lower end of left fibula, subsequent encounter for closed fracture with routine healing: Secondary | ICD-10-CM | POA: Diagnosis not present

## 2021-08-08 DIAGNOSIS — Z09 Encounter for follow-up examination after completed treatment for conditions other than malignant neoplasm: Secondary | ICD-10-CM | POA: Diagnosis not present

## 2021-08-08 DIAGNOSIS — E876 Hypokalemia: Secondary | ICD-10-CM | POA: Diagnosis not present

## 2021-08-08 DIAGNOSIS — M79672 Pain in left foot: Secondary | ICD-10-CM | POA: Diagnosis not present

## 2021-08-08 DIAGNOSIS — M25571 Pain in right ankle and joints of right foot: Secondary | ICD-10-CM | POA: Diagnosis not present

## 2021-08-08 LAB — CSF CELL COUNT WITH DIFFERENTIAL
RBC Count, CSF: 293 cells/uL — ABNORMAL HIGH
TOTAL NUCLEATED CELL: 0 cells/uL (ref 0–5)

## 2021-08-08 LAB — CSF CULTURE W GRAM STAIN
MICRO NUMBER:: 13308602
Result:: NO GROWTH
SPECIMEN QUALITY:: ADEQUATE

## 2021-08-08 LAB — GLUCOSE, CSF: Glucose, CSF: 53 mg/dL (ref 40–80)

## 2021-08-08 LAB — PROTEIN, CSF: Total Protein, CSF: 45 mg/dL (ref 15–45)

## 2021-08-08 NOTE — Progress Notes (Signed)
? ?  Subjective:  ? ? Patient ID: Kristin Ballard, female    DOB: Feb 13, 1986, 36 y.o.   MRN: [id number] ? ?Chief Complaint  ?Patient presents with  ? Follow-up  ? ? ?HPI ?Pt presents to the office today for follow up for bilateral leg weakness. She saw Dr. Livia Snellen on 07/19/21 and had a stat MRI and was negative. Referred to Neurologists who did a stat LP that was negative. She has an EM scheduled.  ? ?She reports she is having worsening leg weakness and they "will just give out".  ? ?She fell on a few days ago while walking and broke her left fibula. She has a followed up with Ortho today. She is wearing an ortho boot. Reports her pain is 8-9 out 10.  ? ?She had hypokalemia, that has slightly improve to 3.1. ? ?Review of Systems  ?All other systems reviewed and are negative. ? ?   ?Objective:  ? Physical Exam ?Vitals reviewed.  ?Constitutional:   ?   General: She is not in acute distress. ?   Appearance: She is well-developed. She is obese.  ?HENT:  ?   Head: Normocephalic and atraumatic.  ?Eyes:  ?   Pupils: Pupils are equal, round, and reactive to light.  ?Neck:  ?   Thyroid: No thyromegaly.  ?Cardiovascular:  ?   Rate and Rhythm: Normal rate and regular rhythm.  ?   Heart sounds: Normal heart sounds. No murmur heard. ?Pulmonary:  ?   Effort: Pulmonary effort is normal. No respiratory distress.  ?   Breath sounds: Normal breath sounds. No wheezing.  ?Abdominal:  ?   General: Bowel sounds are normal. There is no distension.  ?   Palpations: Abdomen is soft.  ?   Tenderness: There is no abdominal tenderness.  ?Musculoskeletal:     ?   General: No tenderness. Normal range of motion.  ?   Cervical back: Normal range of motion and neck supple.  ?Skin: ?   General: Skin is warm and dry.  ?Neurological:  ?   Mental Status: She is alert and oriented to person, place, and time.  ?   Cranial Nerves: No cranial nerve deficit.  ?   Motor: Weakness present.  ?   Gait: Gait abnormal.  ?   Deep Tendon Reflexes: Reflexes are normal  and symmetric.  ?Psychiatric:     ?   Behavior: Behavior normal.     ?   Thought Content: Thought content normal.     ?   Judgment: Judgment normal.  ? ? ? ? ?BP 111/76   Pulse (!) 106   Temp 97.6 ?F (36.4 ?C)   Resp 16   Ht _0  (1.6 m)   Wt 251 lb (113.9 kg)   SpO2 99%   BMI 44.46 kg/m?  ? ?   ?Assessment & Plan:  ?AHLAYA ENDE comes in today with chief complaint of Follow-up ? ? ?Diagnosis and orders addressed: ? ?1. Weakness of both lower extremities ?Keep follow up with Neurologists  ?- CMP14+EGFR ? ?2. Hospital discharge follow-up ?Notes reviewed ?- CMP14+EGFR ? ?3. Hypokalemia ?Labs pending  ?- CMP14+EGFR ? ?4. Closed fracture of distal end of left fibula with routine healing, unspecified fracture morphology, subsequent encounter ?Keep follow up with Ortho ?- CMP14+EGFR ? ? ?Labs pending ?Health Maintenance reviewed ?Diet and exercise encouraged ? ?Follow up plan: ?6 months  ? ?Evelina Dun, FNP ? ? ?

## 2021-08-08 NOTE — Patient Instructions (Signed)
Hypokalemia Hypokalemia means that the amount of potassium in the blood is lower than normal. Potassium is a mineral (electrolyte) that helps regulate the amount of fluid in the body. It also stimulates muscle tightening (contraction) and helps nerves work properly. Normally, most of the body's potassium is inside cells, and only a very small amount is in the blood. Because the amount in the blood is so small, minor changes to potassium levels in the blood can be life-threatening. What are the causes? This condition may be caused by: Antibiotic medicine. Diarrhea or vomiting. Taking too much of a medicine that helps you have a bowel movement (laxative) can cause diarrhea and lead to hypokalemia. Chronic kidney disease (CKD). Medicines that help the body get rid of excess fluid (diuretics). Eating disorders, such as anorexia or bulimia. Low magnesium levels in the body. Sweating a lot. What are the signs or symptoms? Symptoms of this condition include: Weakness. Constipation. Fatigue. Muscle cramps. Mental confusion. Skipped heartbeats or irregular heartbeat (palpitations). Tingling or numbness. How is this diagnosed? This condition is diagnosed with a blood test. How is this treated? This condition may be treated by: Taking potassium supplements. Adjusting the medicines that you take. Eating more foods that contain a lot of potassium. If your potassium level is very low, you may need to get potassium through an IV and be monitored in the hospital. Follow these instructions at home: Eating and drinking  Eat a healthy diet. A healthy diet includes fresh fruits and vegetables, whole grains, healthy fats, and lean proteins. If told, eat more foods that contain a lot of potassium. These include: Nuts, such as peanuts and pistachios. Seeds, such as sunflower seeds and pumpkin seeds. Peas, lentils, and lima beans. Whole grain and bran cereals and breads. Fresh fruits and vegetables,  such as apricots, avocado, bananas, cantaloupe, kiwi, oranges, tomatoes, asparagus, and potatoes. Juices, such as orange, tomato, and prune. Lean meats, including fish. Milk and milk products, such as yogurt. General instructions Take over-the-counter and prescription medicines only as told by your health care provider. This includes vitamins, natural food products, and supplements. Keep all follow-up visits. This is important. Contact a health care provider if: You have weakness that gets worse. You feel your heart pounding or racing. You vomit. You have diarrhea. You have diabetes and you have trouble keeping your blood sugar in your target range. Get help right away if: You have chest pain. You have shortness of breath. You have vomiting or diarrhea that lasts for more than 2 days. You faint. These symptoms may be an emergency. Get help right away. Call 911. Do not wait to see if the symptoms will go away. Do not drive yourself to the hospital. Summary Hypokalemia means that the amount of potassium in the blood is lower than normal. This condition is diagnosed with a blood test. Hypokalemia may be treated by taking potassium supplements, adjusting the medicines that you take, or eating more foods that are high in potassium. If your potassium level is very low, you may need to get potassium through an IV and be monitored in the hospital. This information is not intended to replace advice given to you by your health care provider. Make sure you discuss any questions you have with your health care provider. Document Revised: 12/08/2020 Document Reviewed: 12/08/2020 Elsevier Patient Education  2023 Elsevier Inc.  

## 2021-08-09 LAB — CMP14+EGFR
ALT: 11 IU/L (ref 0–32)
AST: 17 IU/L (ref 0–40)
Albumin/Globulin Ratio: 1.1 — ABNORMAL LOW (ref 1.2–2.2)
Albumin: 3.1 g/dL — ABNORMAL LOW (ref 3.8–4.8)
Alkaline Phosphatase: 77 IU/L (ref 44–121)
BUN/Creatinine Ratio: 5 — ABNORMAL LOW (ref 9–23)
BUN: 4 mg/dL — ABNORMAL LOW (ref 6–20)
Bilirubin Total: 0.6 mg/dL (ref 0.0–1.2)
CO2: 18 mmol/L — ABNORMAL LOW (ref 20–29)
Calcium: 8.2 mg/dL — ABNORMAL LOW (ref 8.7–10.2)
Chloride: 102 mmol/L (ref 96–106)
Creatinine, Ser: 0.88 mg/dL (ref 0.57–1.00)
Globulin, Total: 2.8 g/dL (ref 1.5–4.5)
Glucose: 99 mg/dL (ref 70–99)
Potassium: 4.3 mmol/L (ref 3.5–5.2)
Sodium: 137 mmol/L (ref 134–144)
Total Protein: 5.9 g/dL — ABNORMAL LOW (ref 6.0–8.5)
eGFR: 86 mL/min/{1.73_m2} (ref >59)

## 2021-08-10 ENCOUNTER — Telehealth: Payer: Self-pay

## 2021-08-10 ENCOUNTER — Other Ambulatory Visit: Payer: Self-pay | Admitting: Emergency Medicine

## 2021-08-10 ENCOUNTER — Other Ambulatory Visit: Payer: Self-pay | Admitting: Family

## 2021-08-10 DIAGNOSIS — R29898 Other symptoms and signs involving the musculoskeletal system: Secondary | ICD-10-CM

## 2021-08-10 DIAGNOSIS — R200 Anesthesia of skin: Secondary | ICD-10-CM

## 2021-08-10 NOTE — Telephone Encounter (Signed)
Telephone call to pt, Kristin Ballard would like to order a MRI Lumbar spine with and without Contrast. ?While we wait on [facility] to schedule the Urgent EMG.  ?

## 2021-08-16 ENCOUNTER — Other Ambulatory Visit: Payer: Medicaid Other

## 2021-08-17 LAB — PTH, INTACT AND CALCIUM
Calcium: 9 mg/dL (ref 8.7–10.2)
PTH: 30 pg/mL (ref 15–65)

## 2021-08-17 LAB — VITAMIN D 25 HYDROXY (VIT D DEFICIENCY, FRACTURES): Vit D, 25-Hydroxy: 77.9 ng/mL (ref 30.0–100.0)

## 2021-08-17 LAB — CALCIUM, IONIZED: Calcium, Ion: 5 mg/dL (ref 4.5–5.6)

## 2021-08-22 ENCOUNTER — Ambulatory Visit
Admission: RE | Admit: 2021-08-22 | Discharge: 2021-08-22 | Disposition: A | Discharge status: Home / Self Care | Payer: Medicaid Other | Source: Ambulatory Visit | Attending: Neurology | Admitting: Neurology

## 2021-08-22 DIAGNOSIS — M47816 Spondylosis without myelopathy or radiculopathy, lumbar region: Secondary | ICD-10-CM | POA: Diagnosis not present

## 2021-08-22 DIAGNOSIS — M5126 Other intervertebral disc displacement, lumbar region: Secondary | ICD-10-CM | POA: Diagnosis not present

## 2021-08-22 DIAGNOSIS — R200 Anesthesia of skin: Secondary | ICD-10-CM

## 2021-08-22 DIAGNOSIS — R29898 Other symptoms and signs involving the musculoskeletal system: Secondary | ICD-10-CM

## 2021-08-22 DIAGNOSIS — R531 Weakness: Secondary | ICD-10-CM | POA: Diagnosis not present

## 2021-08-22 DIAGNOSIS — M48061 Spinal stenosis, lumbar region without neurogenic claudication: Secondary | ICD-10-CM | POA: Diagnosis not present

## 2021-08-22 IMAGING — MR MR LUMBAR SPINE WO/W CM
5 of 8 series · 25 of 48 positions shown · IV contrast (20ml multihance)
Comparison: Report of [HOSPITAL] [facility] CT Abdomen and Pelvis
[DATE] (no images available).

CLINICAL DATA: 36-year-old female with low back pain, bilateral
lower extremity pain and weakness. No known injury.

EXAM:
MRI LUMBAR SPINE WITHOUT AND WITH CONTRAST
TECHNIQUE: Multiplanar and multiecho pulse sequences of the lumbar spine were
obtained without and with intravenous contrast.
CONTRAST:  20mL MULTIHANCE GADOBENATE DIMEGLUMINE 529 MG/ML IV SOLN

[Series 3: T2 · sagittal · 4.0mm · 0.53mm/px · 5 of 15 slices shown (1 of 2)]
[im 1/15]
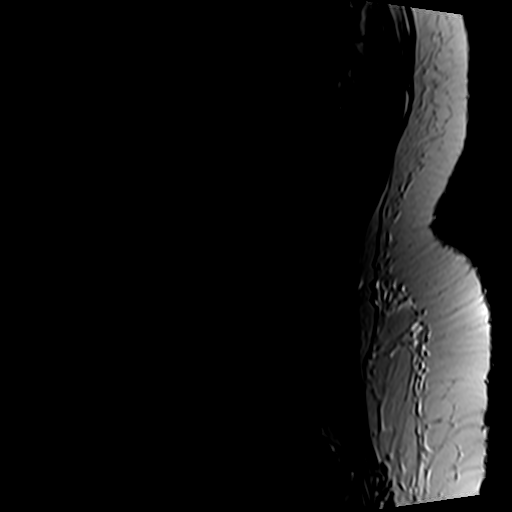
[im 4/15]
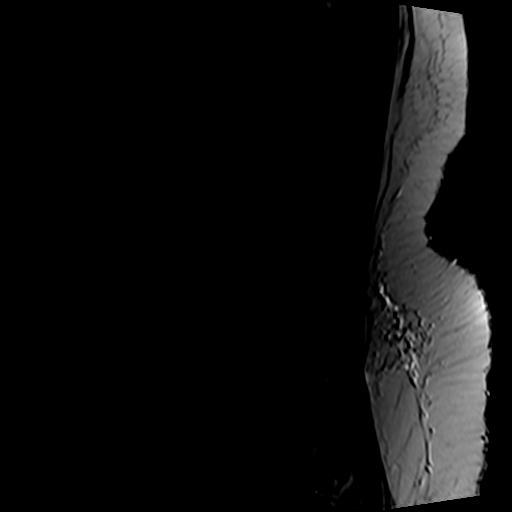
[im 8/15]
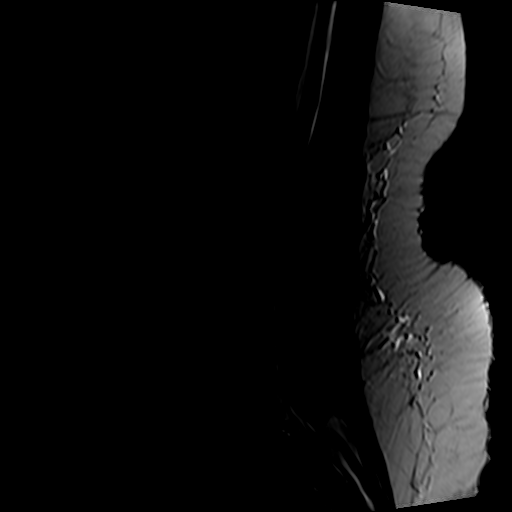
[im 11/15]
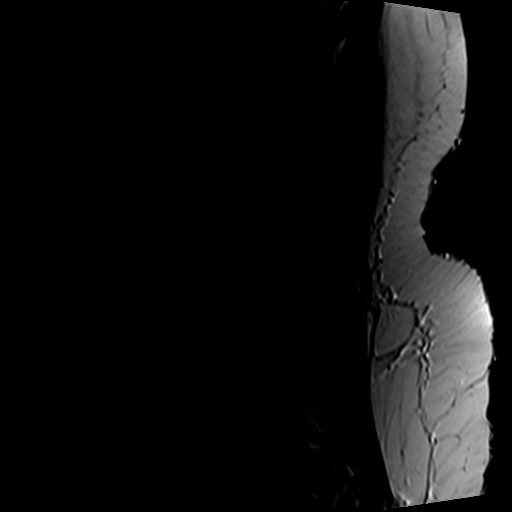
[im 15/15]
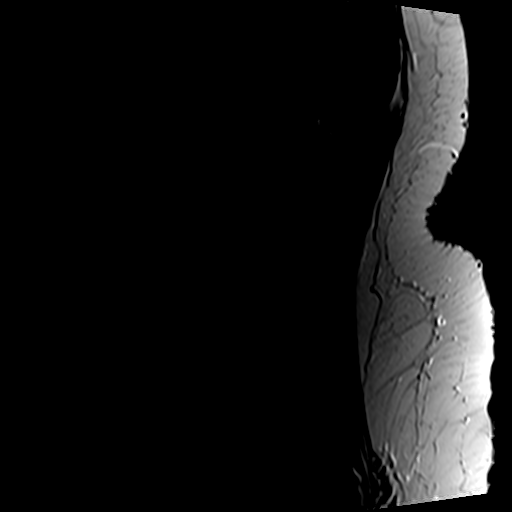

[Series 5: T1 · sagittal · 4.0mm · 0.53mm/px · 4 of 15 slices shown (1 of 2)]
[im 1/15]
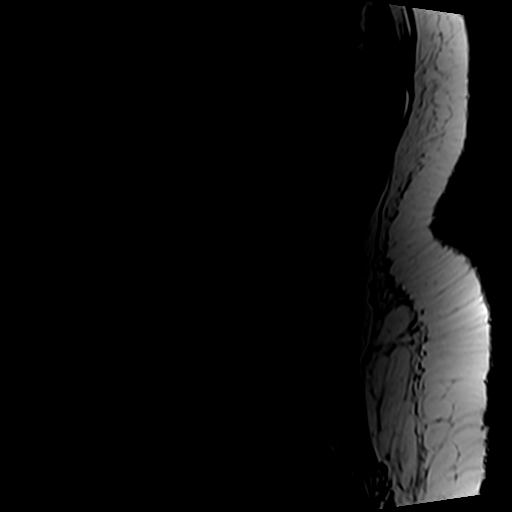
[im 5/15]
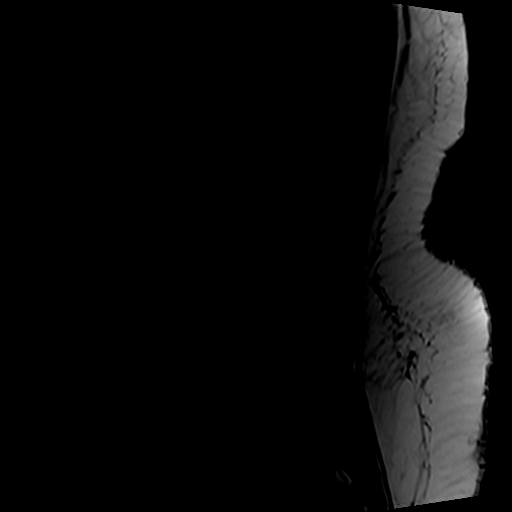
[im 10/15]
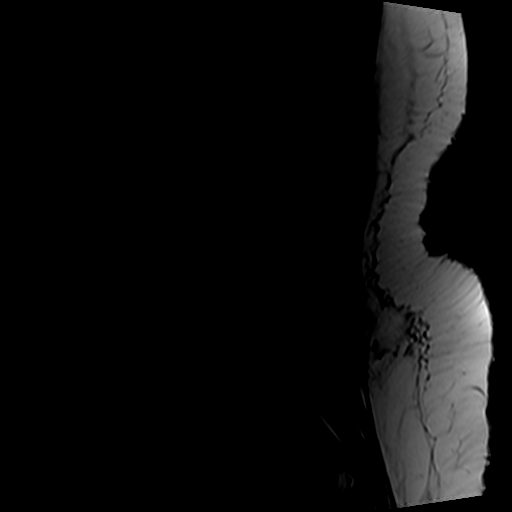
[im 15/15]
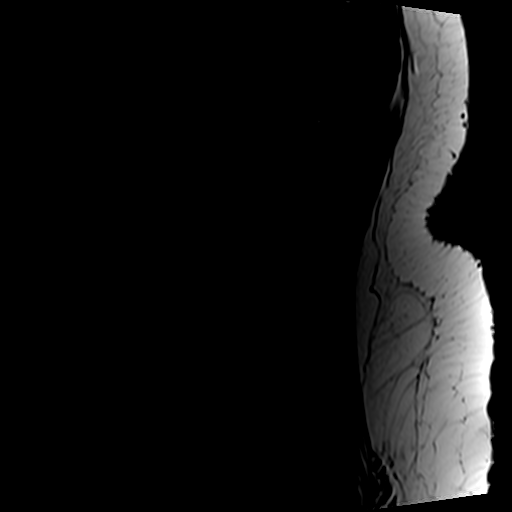

[Series 6: T2 · axial · 4.0mm · 0.70mm/px · z∈[-54,+144]mm · 9 of 35 slices shown (2 of 2)]
[im 1/35]
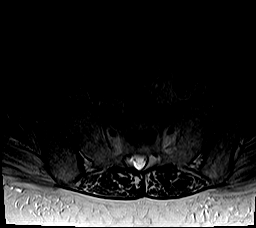
[im 5/35]
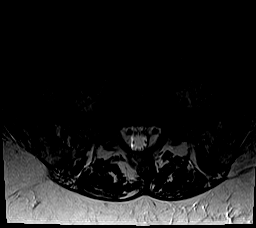
[im 9/35]
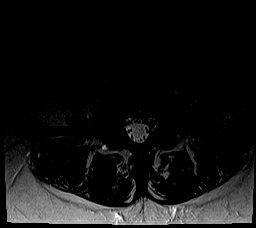
[im 13/35]
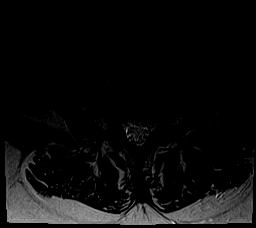
[im 18/35]
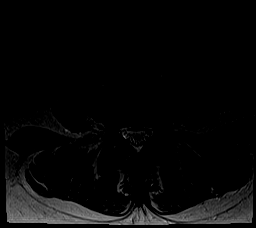
[im 22/35]
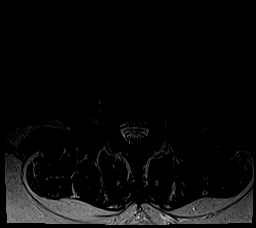
[im 26/35]
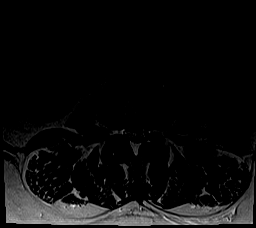
[im 30/35]
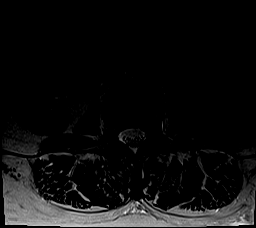
[im 35/35]
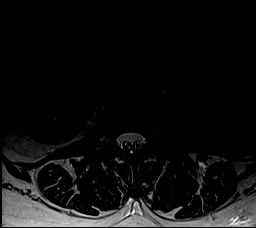

[Series 7: T1 · axial · 4.0mm · 0.35mm/px · z∈[-54,-14]mm · 3 of 35 slices shown (2 of 2)]
[im 1/35]
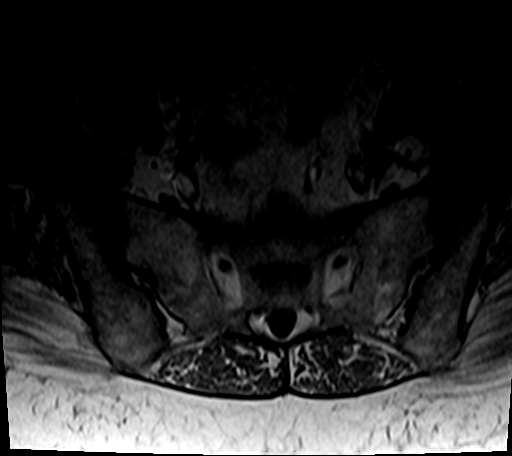
[im 5/35]
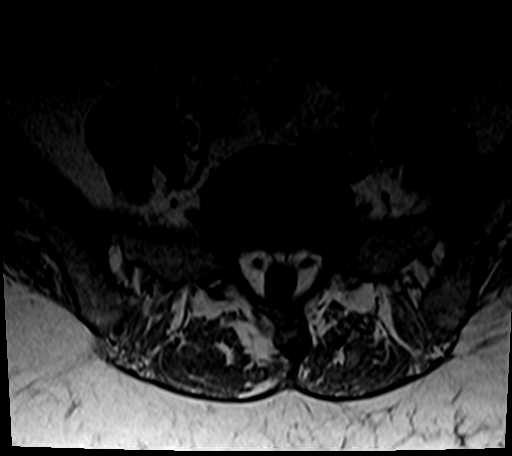
[im 9/35]
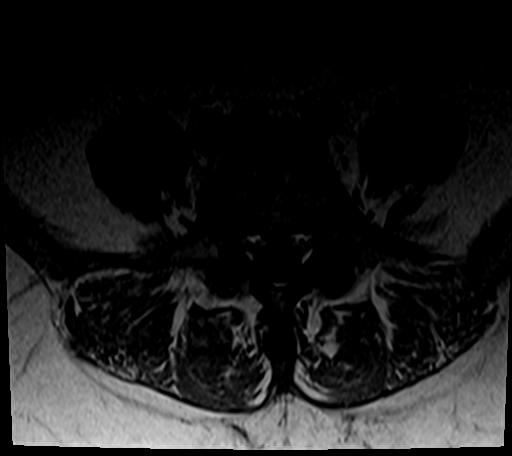

[Series 10: T2 post-contrast · sagittal · 4.0mm · 0.53mm/px · 4 of 15 slices shown]
[im 1/15]
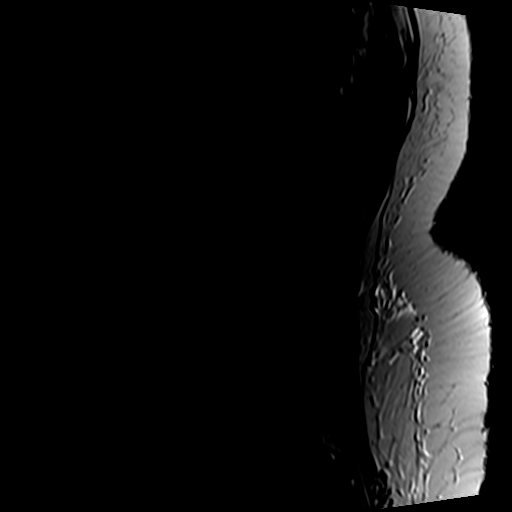
[im 5/15]
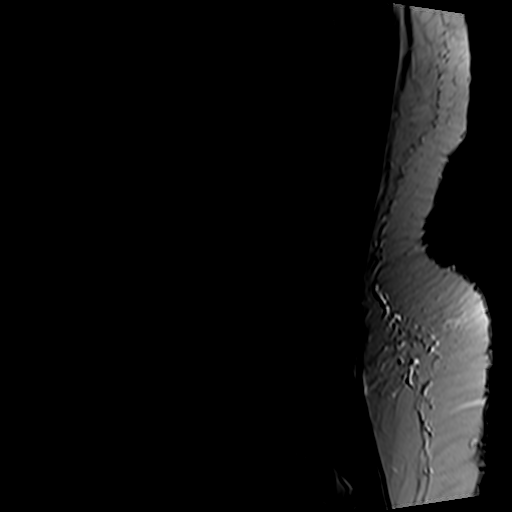
[im 10/15]
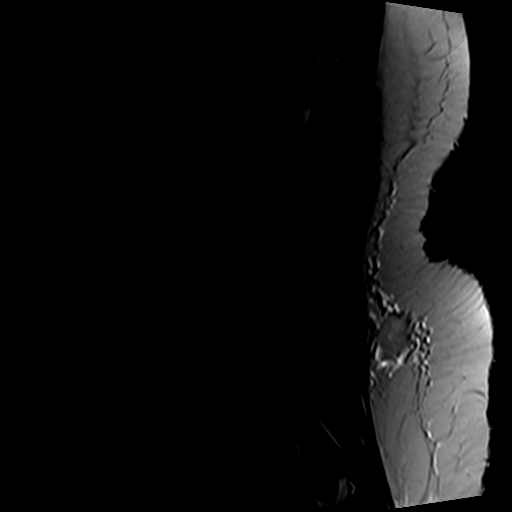
[im 15/15]
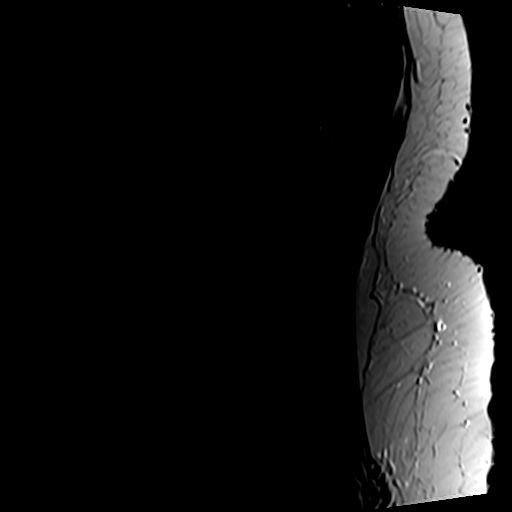

[25 of 48 positions shown; findings below may reference images not displayed]

FINDINGS: Segmentation: Lumbar segmentation appears to be normal and will be
designated as such for this report.

Alignment:  Normal lumbar lordosis.  No spondylolisthesis.

Vertebrae: No marrow edema or evidence of acute osseous abnormality.
Visualized bone marrow signal is within normal limits. Intact
visible sacrum and SI joints.

Conus medullaris and cauda equina: Conus extends to the L1 level. No
lower spinal cord or conus signal abnormality. No abnormal
intradural enhancement or dural thickening. Cauda equina nerve roots
appear normal.

Paraspinal and other soft tissues: Negative.

Disc levels:

T10-T11: Partially visible, grossly negative.

T11-T12: Negative.

T12-L1:  Negative.

L1-L2:  Negative.

L2-L3: Mild far lateral disc bulging. Mild facet hypertrophy greater
on the right. Trace degenerative facet joint fluid. No significant
stenosis.

L3-L4: Disc desiccation. Mild far lateral disc bulging and
superimposed broad-based but small central disc protrusion (series
6, image 17). Mild to moderate facet and ligament flavum
hypertrophy. No significant spinal or lateral recess stenosis. Mild
to moderate left L3 neural foraminal stenosis.

L4-L5: Mild far lateral disc bulging greater on the right. Mild to
moderate facet and ligament flavum hypertrophy. Mild degenerative
signal in the interspinous ligament. No spinal or lateral recess
stenosis. Mild left and mild to moderate right L4 neural foraminal
stenosis.

L5-S1:  Negative disc.  Mild facet hypertrophy.  No stenosis.
IMPRESSION: Lumbar spine degeneration with no spinal or lateral recess stenosis.

Disc bulging and facet hypertrophy combine for up to moderate neural
foraminal stenosis at the left L3 and right L4 nerve levels. Mild
left L4 foraminal stenosis.

## 2021-08-22 MED ORDER — GADOBENATE DIMEGLUMINE 529 MG/ML IV SOLN
20.0000 mL | Freq: Once | INTRAVENOUS | Status: AC | PRN
  Administered 2021-08-22: 20 mL via INTRAVENOUS

## 2021-08-28 DIAGNOSIS — G6289 Other specified polyneuropathies: Secondary | ICD-10-CM | POA: Diagnosis not present

## 2021-08-28 DIAGNOSIS — G629 Polyneuropathy, unspecified: Secondary | ICD-10-CM | POA: Diagnosis not present

## 2021-08-31 DIAGNOSIS — M25572 Pain in left ankle and joints of left foot: Secondary | ICD-10-CM | POA: Diagnosis not present

## 2021-09-06 NOTE — Telephone Encounter (Signed)
Pt called in stating she has completed all the testing Dr. Tomi Likens wanted her to do. She was wanting to schedule a follow up with him. I wasn't sure how far out the pt should be scheduled?

## 2021-09-06 NOTE — Telephone Encounter (Signed)
Per patient, She had her EMG done last Tuesday.  [facility]ed [facility] to get the results. Please fax over a cover sheet to request. Request sent.

## 2021-09-15 ENCOUNTER — Telehealth: Payer: Self-pay | Admitting: Neurology

## 2021-09-15 NOTE — Telephone Encounter (Signed)
Telephone call to patient, Per patient she called to see if her EMG results have been seen from [facility].   Advised patient we sent over request and called to have them faxed over.  Please call as well to let them know we are still waiting.

## 2021-09-15 NOTE — Telephone Encounter (Signed)
Patient would like a call regarding her MRI

## 2021-09-18 NOTE — Telephone Encounter (Signed)
Pt called in stating she spoke with [facility] and they told her they sent our office the results weeks ago.

## 2021-09-18 NOTE — Telephone Encounter (Signed)
Will refax request. [phone]

## 2021-09-19 ENCOUNTER — Telehealth: Payer: Self-pay | Admitting: Neurology

## 2021-09-19 NOTE — Telephone Encounter (Signed)
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  Therefore, I would like to set patient up with IVIG daily for 5 days.  I would also like to check the following labs:  anti-GM1 antibodies, anti-GM1b antibodies, anti-GD1a, anti-GD1b, anti-GQ1b and Ga1NAc-GD1a anibodies.  Kristin Ballard is scheduling patient for follow up visit with me on Thursday to update physical exam.  I spoke with patient and she is aware with plan.

## 2021-09-19 NOTE — Addendum Note (Signed)
Addended by: Leida Lauth on: 09/19/2021 04:18 PM   Modules accepted: Orders

## 2021-09-20 NOTE — Progress Notes (Deleted)
NEUROLOGY FOLLOW UP OFFICE NOTE  Kristin Ballard [id number]  Assessment/Plan:   Acute Motor-Sensory Axonal Neuropathy   Initiate 5 day course of IVIg Will check serum ganglioside antibody panel from [facility]- GM1, GD1a, GD1b       Subjective:  Kristin Ballard is a 36 year old female who follows up for bilateral leg weakness.  UPDATE: Due to concern for Guillain-Barre syndrome, she underwent STAT LP for CSF analysis on 4/25.  CSF protein was only 45 (other labs such as cell count,glucose and culture, were also unremarkable).  She had a NCV-EMG performed at 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  I was not aware when the EMG was performed as results were not faxed over to me.  When the patient informed us on Friday that she had it performed, we requested the report and received it on Tuesday.  I called the patient with results.  ***   HISTORY: In late March-early April, she woke up with bilateral lower extremity soreness.  She noted numbness and tingling in her toes and finger tips.  Over the next several days, then pain got worse.  She started experiencing weakness.  When she stands, her legs would just give out.  She has pain, numbness and tingling in the lateral thighs.  She has numbness in her toes and shins.  She has midline low back pain  Denies neck pain.  She has tingling in her fingertips but denies weakness or radicular pain in the upper extremities.  Denies bowel or bladder dysfunction.  Currently in a wheelchair.  Sometimes noted blurred vision but no diplopia.  No shortness of breath.  Denies preceding injury.  She did have some upper respiratory symptoms (cough, congestion) but thought it was allergies.   Blood work from 07/19/2021 included negative ANA, C3, C4 and dsDNA abs; sed rate 7; B12 572, low folate 2.1; negative NMO IgG; CBC with low Hgb/HCT 9.1/27  and MCV 105; and CMP with low K+ 2.9.  She was started on KCl.  MRI of brain with and without contrast on 07/20/2021 personally reviewed was normal.    PAST MEDICAL HISTORY: Past Medical History:  Diagnosis Date   Allergy    Functional ovarian cysts    Migraines     MEDICATIONS: Current Outpatient Medications on File Prior to Visit  Medication Sig Dispense Refill   cetirizine (ZYRTEC) 10 MG tablet Take 10 mg by mouth daily.     folic acid (FOLVITE) 1 MG tablet Take 1 tablet (1 mg total) by mouth daily. 100 tablet 3   gabapentin (NEURONTIN) 100 MG capsule Take 1 capsule three times daily for one week, then 2 capsules three times daily for one week, then 3 capsules three times daily 270 capsule 0   hydrocortisone (ANUSOL-HC) 25 MG suppository Place 1 suppository (25 mg total) rectally 4 (four) times daily as needed for hemorrhoids. 20 suppository 1   omeprazole (PRILOSEC) 20 MG capsule Take 1 capsule (20 mg total) by mouth daily. 90 capsule 4   potassium chloride SA (KLOR-CON M) 20 MEQ tablet Take 1 tablet (20 mEq total) by mouth daily. As a potassium supplement 30 tablet 3   SPRINTEC 28 0.25-35 MG-MCG tablet Take 1 tablet by mouth once daily 84 tablet 1   Vitamin D, Ergocalciferol, (DRISDOL) 1.25 MG (50000 UNIT) CAPS capsule Take 1 capsule (50,000 Units total) by mouth every 7 (seven) days. 12 capsule  3   No current facility-administered medications on file prior to visit.    ALLERGIES: Allergies  Allergen Reactions   Latex     FAMILY HISTORY: Family History  Problem Relation Age of Onset   Depression Mother    Diabetes Father    Cancer Father        melanoma      Objective:  *** General: No acute distress.  Patient appears ***-groomed.   Head:  Normocephalic/atraumatic Eyes:  Fundi examined but not visualized Neck: supple, no paraspinal tenderness, full range of motion Heart:  Regular rate and rhythm Lungs:  Clear to auscultation bilaterally Back: No paraspinal  tenderness Neurological Exam: alert and oriented to person, place, and time.  Speech fluent and not dysarthric, language intact.  CN II-XII intact. Bulk and tone normal, muscle strength 5/5 throughout.  Sensation to light touch intact.  Deep tendon reflexes 2+ throughout, toes downgoing.  Finger to nose testing intact.  Gait normal, Romberg negative.   Metta Clines, DO  CC: ***

## 2021-09-21 ENCOUNTER — Ambulatory Visit: Payer: Medicaid Other | Admitting: Neurology

## 2021-09-21 ENCOUNTER — Encounter: Payer: Self-pay | Admitting: Neurology

## 2021-09-21 ENCOUNTER — Telehealth: Payer: Self-pay

## 2021-09-21 DIAGNOSIS — R579 Shock, unspecified: Secondary | ICD-10-CM | POA: Diagnosis not present

## 2021-09-21 DIAGNOSIS — M62838 Other muscle spasm: Secondary | ICD-10-CM | POA: Diagnosis not present

## 2021-09-21 DIAGNOSIS — Z20822 Contact with and (suspected) exposure to covid-19: Secondary | ICD-10-CM | POA: Diagnosis not present

## 2021-09-21 DIAGNOSIS — K219 Gastro-esophageal reflux disease without esophagitis: Secondary | ICD-10-CM | POA: Diagnosis not present

## 2021-09-21 DIAGNOSIS — Z452 Encounter for adjustment and management of vascular access device: Secondary | ICD-10-CM | POA: Diagnosis not present

## 2021-09-21 DIAGNOSIS — Z7409 Other reduced mobility: Secondary | ICD-10-CM | POA: Diagnosis not present

## 2021-09-21 DIAGNOSIS — R0601 Orthopnea: Secondary | ICD-10-CM | POA: Diagnosis not present

## 2021-09-21 DIAGNOSIS — G629 Polyneuropathy, unspecified: Secondary | ICD-10-CM | POA: Diagnosis not present

## 2021-09-21 DIAGNOSIS — J1520 Pneumonia due to staphylococcus, unspecified: Secondary | ICD-10-CM | POA: Diagnosis not present

## 2021-09-21 DIAGNOSIS — E8809 Other disorders of plasma-protein metabolism, not elsewhere classified: Secondary | ICD-10-CM | POA: Diagnosis not present

## 2021-09-21 DIAGNOSIS — D696 Thrombocytopenia, unspecified: Secondary | ICD-10-CM | POA: Diagnosis not present

## 2021-09-21 DIAGNOSIS — Z9911 Dependence on respirator [ventilator] status: Secondary | ICD-10-CM | POA: Diagnosis not present

## 2021-09-21 DIAGNOSIS — N182 Chronic kidney disease, stage 2 (mild): Secondary | ICD-10-CM | POA: Diagnosis not present

## 2021-09-21 DIAGNOSIS — N178 Other acute kidney failure: Secondary | ICD-10-CM | POA: Diagnosis not present

## 2021-09-21 DIAGNOSIS — R0902 Hypoxemia: Secondary | ICD-10-CM | POA: Diagnosis not present

## 2021-09-21 DIAGNOSIS — E872 Acidosis, unspecified: Secondary | ICD-10-CM | POA: Diagnosis not present

## 2021-09-21 DIAGNOSIS — E8770 Fluid overload, unspecified: Secondary | ICD-10-CM | POA: Diagnosis not present

## 2021-09-21 DIAGNOSIS — J15212 Pneumonia due to Methicillin resistant Staphylococcus aureus: Secondary | ICD-10-CM | POA: Diagnosis not present

## 2021-09-21 DIAGNOSIS — M2550 Pain in unspecified joint: Secondary | ICD-10-CM | POA: Diagnosis not present

## 2021-09-21 DIAGNOSIS — K7200 Acute and subacute hepatic failure without coma: Secondary | ICD-10-CM | POA: Diagnosis not present

## 2021-09-21 DIAGNOSIS — D649 Anemia, unspecified: Secondary | ICD-10-CM | POA: Diagnosis not present

## 2021-09-21 DIAGNOSIS — R748 Abnormal levels of other serum enzymes: Secondary | ICD-10-CM | POA: Diagnosis not present

## 2021-09-21 DIAGNOSIS — R7401 Elevation of levels of liver transaminase levels: Secondary | ICD-10-CM | POA: Diagnosis not present

## 2021-09-21 DIAGNOSIS — E871 Hypo-osmolality and hyponatremia: Secondary | ICD-10-CM | POA: Diagnosis not present

## 2021-09-21 DIAGNOSIS — G6181 Chronic inflammatory demyelinating polyneuritis: Secondary | ICD-10-CM | POA: Diagnosis not present

## 2021-09-21 DIAGNOSIS — M791 Myalgia, unspecified site: Secondary | ICD-10-CM | POA: Diagnosis not present

## 2021-09-21 DIAGNOSIS — I959 Hypotension, unspecified: Secondary | ICD-10-CM | POA: Diagnosis not present

## 2021-09-21 DIAGNOSIS — Z9981 Dependence on supplemental oxygen: Secondary | ICD-10-CM | POA: Diagnosis not present

## 2021-09-21 DIAGNOSIS — N170 Acute kidney failure with tubular necrosis: Secondary | ICD-10-CM | POA: Diagnosis not present

## 2021-09-21 DIAGNOSIS — Z7901 Long term (current) use of anticoagulants: Secondary | ICD-10-CM | POA: Diagnosis not present

## 2021-09-21 DIAGNOSIS — Z743 Need for continuous supervision: Secondary | ICD-10-CM | POA: Diagnosis not present

## 2021-09-21 DIAGNOSIS — R051 Acute cough: Secondary | ICD-10-CM | POA: Diagnosis not present

## 2021-09-21 DIAGNOSIS — G8929 Other chronic pain: Secondary | ICD-10-CM | POA: Diagnosis not present

## 2021-09-21 DIAGNOSIS — R531 Weakness: Secondary | ICD-10-CM | POA: Diagnosis not present

## 2021-09-21 DIAGNOSIS — F419 Anxiety disorder, unspecified: Secondary | ICD-10-CM | POA: Diagnosis not present

## 2021-09-21 DIAGNOSIS — G6289 Other specified polyneuropathies: Secondary | ICD-10-CM | POA: Diagnosis not present

## 2021-09-21 DIAGNOSIS — E610 Copper deficiency: Secondary | ICD-10-CM | POA: Diagnosis not present

## 2021-09-21 DIAGNOSIS — R109 Unspecified abdominal pain: Secondary | ICD-10-CM | POA: Diagnosis not present

## 2021-09-21 DIAGNOSIS — A419 Sepsis, unspecified organism: Secondary | ICD-10-CM | POA: Diagnosis not present

## 2021-09-21 DIAGNOSIS — G610 Guillain-Barre syndrome: Secondary | ICD-10-CM | POA: Diagnosis not present

## 2021-09-21 DIAGNOSIS — J90 Pleural effusion, not elsewhere classified: Secondary | ICD-10-CM | POA: Diagnosis not present

## 2021-09-21 DIAGNOSIS — R34 Anuria and oliguria: Secondary | ICD-10-CM | POA: Diagnosis not present

## 2021-09-21 DIAGNOSIS — D62 Acute posthemorrhagic anemia: Secondary | ICD-10-CM | POA: Diagnosis not present

## 2021-09-21 DIAGNOSIS — E874 Mixed disorder of acid-base balance: Secondary | ICD-10-CM | POA: Diagnosis not present

## 2021-09-21 DIAGNOSIS — G909 Disorder of the autonomic nervous system, unspecified: Secondary | ICD-10-CM | POA: Diagnosis not present

## 2021-09-21 DIAGNOSIS — Z6841 Body Mass Index (BMI) 40.0 and over, adult: Secondary | ICD-10-CM | POA: Diagnosis not present

## 2021-09-21 DIAGNOSIS — R0689 Other abnormalities of breathing: Secondary | ICD-10-CM | POA: Diagnosis not present

## 2021-09-21 DIAGNOSIS — D65 Disseminated intravascular coagulation [defibrination syndrome]: Secondary | ICD-10-CM | POA: Diagnosis not present

## 2021-09-21 DIAGNOSIS — Z029 Encounter for administrative examinations, unspecified: Secondary | ICD-10-CM

## 2021-09-21 DIAGNOSIS — R069 Unspecified abnormalities of breathing: Secondary | ICD-10-CM | POA: Diagnosis not present

## 2021-09-21 DIAGNOSIS — R6521 Severe sepsis with septic shock: Secondary | ICD-10-CM | POA: Diagnosis not present

## 2021-09-21 DIAGNOSIS — J15211 Pneumonia due to Methicillin susceptible Staphylococcus aureus: Secondary | ICD-10-CM | POA: Diagnosis not present

## 2021-09-21 DIAGNOSIS — E668 Other obesity: Secondary | ICD-10-CM | POA: Diagnosis not present

## 2021-09-21 DIAGNOSIS — R7989 Other specified abnormal findings of blood chemistry: Secondary | ICD-10-CM | POA: Diagnosis not present

## 2021-09-21 DIAGNOSIS — D72829 Elevated white blood cell count, unspecified: Secondary | ICD-10-CM | POA: Diagnosis not present

## 2021-09-21 DIAGNOSIS — R000 Tachycardia, unspecified: Secondary | ICD-10-CM | POA: Diagnosis not present

## 2021-09-21 DIAGNOSIS — R188 Other ascites: Secondary | ICD-10-CM | POA: Diagnosis not present

## 2021-09-21 DIAGNOSIS — I499 Cardiac arrhythmia, unspecified: Secondary | ICD-10-CM | POA: Diagnosis not present

## 2021-09-21 DIAGNOSIS — R4182 Altered mental status, unspecified: Secondary | ICD-10-CM | POA: Diagnosis not present

## 2021-09-21 DIAGNOSIS — N179 Acute kidney failure, unspecified: Secondary | ICD-10-CM | POA: Diagnosis not present

## 2021-09-21 DIAGNOSIS — E222 Syndrome of inappropriate secretion of antidiuretic hormone: Secondary | ICD-10-CM | POA: Diagnosis not present

## 2021-09-21 NOTE — Telephone Encounter (Signed)
I tried to contact patient, I got an answer machine, per Dr.Jaffe ntbs. Trying to get her to answer and return call for appt tomorrow.

## 2021-09-26 NOTE — Telephone Encounter (Signed)
LMOVM to call the office back.

## 2021-10-05 ENCOUNTER — Encounter: Payer: Medicaid Other | Admitting: Family

## 2021-10-13 DIAGNOSIS — E8770 Fluid overload, unspecified: Secondary | ICD-10-CM | POA: Diagnosis not present

## 2021-10-13 DIAGNOSIS — D62 Acute posthemorrhagic anemia: Secondary | ICD-10-CM | POA: Diagnosis not present

## 2021-10-13 DIAGNOSIS — G6289 Other specified polyneuropathies: Secondary | ICD-10-CM | POA: Diagnosis not present

## 2021-10-13 DIAGNOSIS — Z7409 Other reduced mobility: Secondary | ICD-10-CM | POA: Diagnosis not present

## 2021-10-13 DIAGNOSIS — N390 Urinary tract infection, site not specified: Secondary | ICD-10-CM | POA: Diagnosis not present

## 2021-10-13 DIAGNOSIS — E873 Alkalosis: Secondary | ICD-10-CM | POA: Diagnosis not present

## 2021-10-13 DIAGNOSIS — Z792 Long term (current) use of antibiotics: Secondary | ICD-10-CM | POA: Diagnosis not present

## 2021-10-13 DIAGNOSIS — F419 Anxiety disorder, unspecified: Secondary | ICD-10-CM | POA: Diagnosis not present

## 2021-10-13 DIAGNOSIS — N178 Other acute kidney failure: Secondary | ICD-10-CM | POA: Diagnosis not present

## 2021-10-13 DIAGNOSIS — S93432A Sprain of tibiofibular ligament of left ankle, initial encounter: Secondary | ICD-10-CM | POA: Diagnosis not present

## 2021-10-13 DIAGNOSIS — S92352A Displaced fracture of fifth metatarsal bone, left foot, initial encounter for closed fracture: Secondary | ICD-10-CM | POA: Diagnosis not present

## 2021-10-13 DIAGNOSIS — S82832D Other fracture of upper and lower end of left fibula, subsequent encounter for closed fracture with routine healing: Secondary | ICD-10-CM | POA: Diagnosis not present

## 2021-10-13 DIAGNOSIS — E876 Hypokalemia: Secondary | ICD-10-CM | POA: Diagnosis not present

## 2021-10-13 DIAGNOSIS — M62838 Other muscle spasm: Secondary | ICD-10-CM | POA: Diagnosis not present

## 2021-10-17 DIAGNOSIS — M7989 Other specified soft tissue disorders: Secondary | ICD-10-CM | POA: Diagnosis not present

## 2021-10-20 DIAGNOSIS — G6289 Other specified polyneuropathies: Secondary | ICD-10-CM | POA: Diagnosis not present

## 2021-10-20 DIAGNOSIS — N390 Urinary tract infection, site not specified: Secondary | ICD-10-CM | POA: Diagnosis not present

## 2021-10-20 DIAGNOSIS — S93432A Sprain of tibiofibular ligament of left ankle, initial encounter: Secondary | ICD-10-CM | POA: Diagnosis not present

## 2021-10-20 DIAGNOSIS — N178 Other acute kidney failure: Secondary | ICD-10-CM | POA: Diagnosis not present

## 2021-10-20 DIAGNOSIS — F419 Anxiety disorder, unspecified: Secondary | ICD-10-CM | POA: Diagnosis not present

## 2021-10-20 DIAGNOSIS — Z792 Long term (current) use of antibiotics: Secondary | ICD-10-CM | POA: Diagnosis not present

## 2021-10-20 DIAGNOSIS — E876 Hypokalemia: Secondary | ICD-10-CM | POA: Diagnosis not present

## 2021-10-20 DIAGNOSIS — E873 Alkalosis: Secondary | ICD-10-CM | POA: Diagnosis not present

## 2021-10-20 DIAGNOSIS — Z7409 Other reduced mobility: Secondary | ICD-10-CM | POA: Diagnosis not present

## 2021-10-20 DIAGNOSIS — M62838 Other muscle spasm: Secondary | ICD-10-CM | POA: Diagnosis not present

## 2021-10-20 DIAGNOSIS — E8770 Fluid overload, unspecified: Secondary | ICD-10-CM | POA: Diagnosis not present

## 2021-10-20 DIAGNOSIS — D62 Acute posthemorrhagic anemia: Secondary | ICD-10-CM | POA: Diagnosis not present

## 2021-10-23 DIAGNOSIS — K219 Gastro-esophageal reflux disease without esophagitis: Secondary | ICD-10-CM | POA: Diagnosis not present

## 2021-10-23 DIAGNOSIS — D62 Acute posthemorrhagic anemia: Secondary | ICD-10-CM | POA: Diagnosis not present

## 2021-10-23 DIAGNOSIS — S93432A Sprain of tibiofibular ligament of left ankle, initial encounter: Secondary | ICD-10-CM | POA: Diagnosis not present

## 2021-10-23 DIAGNOSIS — G6289 Other specified polyneuropathies: Secondary | ICD-10-CM | POA: Diagnosis not present

## 2021-10-23 DIAGNOSIS — M62838 Other muscle spasm: Secondary | ICD-10-CM | POA: Diagnosis not present

## 2021-10-23 DIAGNOSIS — Z7409 Other reduced mobility: Secondary | ICD-10-CM | POA: Diagnosis not present

## 2021-10-23 DIAGNOSIS — E873 Alkalosis: Secondary | ICD-10-CM | POA: Diagnosis not present

## 2021-10-23 DIAGNOSIS — Z792 Long term (current) use of antibiotics: Secondary | ICD-10-CM | POA: Diagnosis not present

## 2021-10-23 DIAGNOSIS — F419 Anxiety disorder, unspecified: Secondary | ICD-10-CM | POA: Diagnosis not present

## 2021-10-23 DIAGNOSIS — E8770 Fluid overload, unspecified: Secondary | ICD-10-CM | POA: Diagnosis not present

## 2021-10-23 DIAGNOSIS — N178 Other acute kidney failure: Secondary | ICD-10-CM | POA: Diagnosis not present

## 2021-10-23 DIAGNOSIS — N390 Urinary tract infection, site not specified: Secondary | ICD-10-CM | POA: Diagnosis not present

## 2021-10-24 DIAGNOSIS — Z7409 Other reduced mobility: Secondary | ICD-10-CM | POA: Diagnosis not present

## 2021-10-24 DIAGNOSIS — K219 Gastro-esophageal reflux disease without esophagitis: Secondary | ICD-10-CM | POA: Diagnosis not present

## 2021-10-24 DIAGNOSIS — D62 Acute posthemorrhagic anemia: Secondary | ICD-10-CM | POA: Diagnosis not present

## 2021-10-24 DIAGNOSIS — S93432A Sprain of tibiofibular ligament of left ankle, initial encounter: Secondary | ICD-10-CM | POA: Diagnosis not present

## 2021-10-24 DIAGNOSIS — G6289 Other specified polyneuropathies: Secondary | ICD-10-CM | POA: Diagnosis not present

## 2021-10-24 DIAGNOSIS — Z792 Long term (current) use of antibiotics: Secondary | ICD-10-CM | POA: Diagnosis not present

## 2021-10-24 DIAGNOSIS — E8770 Fluid overload, unspecified: Secondary | ICD-10-CM | POA: Diagnosis not present

## 2021-10-24 DIAGNOSIS — M62838 Other muscle spasm: Secondary | ICD-10-CM | POA: Diagnosis not present

## 2021-10-24 DIAGNOSIS — E873 Alkalosis: Secondary | ICD-10-CM | POA: Diagnosis not present

## 2021-10-24 DIAGNOSIS — F419 Anxiety disorder, unspecified: Secondary | ICD-10-CM | POA: Diagnosis not present

## 2021-10-24 DIAGNOSIS — N178 Other acute kidney failure: Secondary | ICD-10-CM | POA: Diagnosis not present

## 2021-10-26 DIAGNOSIS — S82402A Unspecified fracture of shaft of left fibula, initial encounter for closed fracture: Secondary | ICD-10-CM | POA: Diagnosis not present

## 2021-10-26 DIAGNOSIS — R6521 Severe sepsis with septic shock: Secondary | ICD-10-CM | POA: Diagnosis not present

## 2021-10-26 DIAGNOSIS — M6281 Muscle weakness (generalized): Secondary | ICD-10-CM | POA: Diagnosis not present

## 2021-10-26 DIAGNOSIS — G629 Polyneuropathy, unspecified: Secondary | ICD-10-CM | POA: Diagnosis not present

## 2021-10-31 ENCOUNTER — Encounter: Payer: Self-pay | Admitting: Nurse Practitioner

## 2021-10-31 ENCOUNTER — Ambulatory Visit: Payer: Medicaid Other | Admitting: Nurse Practitioner

## 2021-10-31 VITALS — BP 116/87 | HR 118 | Temp 98.1°F | Resp 20

## 2021-10-31 DIAGNOSIS — R29898 Other symptoms and signs involving the musculoskeletal system: Secondary | ICD-10-CM | POA: Diagnosis not present

## 2021-10-31 DIAGNOSIS — Z09 Encounter for follow-up examination after completed treatment for conditions other than malignant neoplasm: Secondary | ICD-10-CM

## 2021-10-31 MED ORDER — GABAPENTIN 100 MG PO CAPS: ORAL_CAPSULE | ORAL | 0 refills | Status: DC

## 2021-10-31 NOTE — Progress Notes (Signed)
   Subjective:    Patient ID: Kristin Ballard, female    DOB: April 16, 1985, 36 y.o.   MRN: [id number]   Chief Complaint: Hospitalization Follow-up   HPI Patient is here today for hospital follow up. SHe was in hospital with sepsis and weakness. She is suppose to be in rehab. SH e says that has not been set up yet. She was dx with  neurological impairment and poly neuropathy. She is suppose to see neurology on 11/29/21. Need to set up pt for her.    Review of Systems  Constitutional:  Negative for diaphoresis.  Eyes:  Negative for pain.  Respiratory:  Negative for shortness of breath.   Cardiovascular:  Negative for chest pain, palpitations and leg swelling.  Gastrointestinal:  Negative for abdominal pain.  Endocrine: Negative for polydipsia.  Skin:  Negative for rash.  Neurological:  Negative for dizziness, weakness and headaches.  Hematological:  Does not bruise/bleed easily.  All other systems reviewed and are negative.      Objective:   Physical Exam Constitutional:      Appearance: Normal appearance.  Cardiovascular:     Rate and Rhythm: Normal rate and regular rhythm.     Heart sounds: Normal heart sounds.  Pulmonary:     Effort: Pulmonary effort is normal.     Breath sounds: Normal breath sounds.  Musculoskeletal:     Comments: Fracture boot on left lower leg. Patient sitting in wheel chair Very unstable with standing. Cannot walk without assistance and can only walk a few steps at a time.  Neurological:     General: No focal deficit present.     Mental Status: She is alert and oriented to person, place, and time.  Psychiatric:        Mood and Affect: Mood normal.        Behavior: Behavior normal.    BP 116/87   Pulse (!) 118   Temp 98.1 F (36.7 C) (Temporal)   Resp 20   SpO2 97%         Assessment & Plan:  Kristin Ballard in today with chief complaint of Hospitalization Follow-up   1. Weakness of both lower extremities Referral for pt Continue  to use assistance when standing or walking - Ambulatory referral to Physical Therapy  2. Hospital discharge follow-up Hospital records reviewed  Orders Placed This Encounter  Procedures   CBC with Differential/Platelet   CMP14+EGFR   Ambulatory referral to Physical Therapy    Referral Priority:   Routine    Referral Type:   Physical Medicine    Referral Reason:   Specialty Services Required    Requested Specialty:   Physical Therapy    Number of Visits Requested:   1     The above assessment and management plan was discussed with the patient. The patient verbalized understanding of and has agreed to the management plan. Patient is aware to call the clinic if symptoms persist or worsen. Patient is aware when to return to the clinic for a follow-up visit. Patient educated on when it is appropriate to go to the emergency department.   Mary-Margaret Hassell Done, FNP

## 2021-10-31 NOTE — Patient Instructions (Signed)
Weakness Weakness is a lack of strength. You may feel weak all over your body (generalized), or you may feel weak in one part of your body (focal). There are many potential causes of weakness. Sometimes, the cause of your weakness may not be known. Some causes of weakness can be serious, so it is important to see your doctor. Follow these instructions at home: Activity Rest as needed. Try to get enough sleep. Most adults need 7-8 hours of sleep each night. Talk to your doctor about how much sleep you need. Do exercises, such as arm curls and leg raises, for 30 minutes at least 2 days a week or as told by your doctor. Think about working with a physical therapist or trainer to help you get stronger. General instructions  Take over-the-counter and prescription medicines only as told by your doctor. Eat a healthy, well-balanced diet. This includes: Proteins to build muscles, such as lean meats and fish. Fresh fruits and vegetables. Carbohydrates to boost energy, such as whole grains. Drink enough fluid to keep your pee (urine) pale yellow. Keep all follow-up visits. Contact a doctor if: Your weakness does not get better or it gets worse. Your weakness affects your ability to: Think clearly. Do your normal daily activities. Get help right away if: You have sudden weakness on one side of your face or body. You have chest pain. You have trouble breathing or shortness of breath. You have problems with how you see (vision). You have trouble talking or swallowing. You have trouble standing or walking. You are light-headed or faint. These symptoms may be an emergency. Get help right away. Call 911. Do not wait to see if the symptoms will go away. Do not drive yourself to the hospital. Summary Weakness is a lack of strength. You may feel weak all over your body or just in one part of your body. There are many potential causes of weakness. Sometimes, the cause of your weakness may not be  known. Rest as needed, and try to get enough sleep. Most adults need 7-8 hours of sleep each night. Eat a healthy, well-balanced diet. This information is not intended to replace advice given to you by your health care provider. Make sure you discuss any questions you have with your health care provider. Document Revised: 02/26/2021 Document Reviewed: 02/26/2021 Elsevier Patient Education  2023 Elsevier Inc.  

## 2021-11-01 LAB — CBC WITH DIFFERENTIAL/PLATELET
Basophils Absolute: 0 10*3/uL (ref 0.0–0.2)
Basos: 1 %
EOS (ABSOLUTE): 0.1 10*3/uL (ref 0.0–0.4)
Eos: 2 %
Hematocrit: 30 % — ABNORMAL LOW (ref 34.0–46.6)
Hemoglobin: 9.6 g/dL — ABNORMAL LOW (ref 11.1–15.9)
Immature Grans (Abs): 0 10*3/uL (ref 0.0–0.1)
Immature Granulocytes: 0 %
Lymphocytes Absolute: 2.7 10*3/uL (ref 0.7–3.1)
Lymphs: 44 %
MCH: 28.8 pg (ref 26.6–33.0)
MCHC: 32 g/dL (ref 31.5–35.7)
MCV: 90 fL (ref 79–97)
Monocytes Absolute: 0.5 10*3/uL (ref 0.1–0.9)
Monocytes: 8 %
Neutrophils Absolute: 2.7 10*3/uL (ref 1.4–7.0)
Neutrophils: 45 %
Platelets: 295 10*3/uL (ref 150–450)
RBC: 3.33 x10E6/uL — ABNORMAL LOW (ref 3.77–5.28)
RDW: 14.4 % (ref 11.7–15.4)
WBC: 6 10*3/uL (ref 3.4–10.8)

## 2021-11-01 LAB — CMP14+EGFR
ALT: 59 IU/L — ABNORMAL HIGH (ref 0–32)
AST: 100 IU/L — ABNORMAL HIGH (ref 0–40)
Albumin/Globulin Ratio: 1.2 (ref 1.2–2.2)
Albumin: 3.4 g/dL — ABNORMAL LOW (ref 3.9–4.9)
Alkaline Phosphatase: 80 IU/L (ref 44–121)
BUN/Creatinine Ratio: 6 — ABNORMAL LOW (ref 9–23)
BUN: 4 mg/dL — ABNORMAL LOW (ref 6–20)
Bilirubin Total: 0.3 mg/dL (ref 0.0–1.2)
CO2: 24 mmol/L (ref 20–29)
Calcium: 9.2 mg/dL (ref 8.7–10.2)
Chloride: 108 mmol/L — ABNORMAL HIGH (ref 96–106)
Creatinine, Ser: 0.64 mg/dL (ref 0.57–1.00)
Globulin, Total: 2.8 g/dL (ref 1.5–4.5)
Glucose: 116 mg/dL — ABNORMAL HIGH (ref 70–99)
Potassium: 3.6 mmol/L (ref 3.5–5.2)
Sodium: 147 mmol/L — ABNORMAL HIGH (ref 134–144)
Total Protein: 6.2 g/dL (ref 6.0–8.5)
eGFR: 116 mL/min/{1.73_m2} (ref >59)

## 2021-11-15 ENCOUNTER — Ambulatory Visit: Payer: Medicaid Other | Admitting: Physical Therapy

## 2021-11-15 DIAGNOSIS — R29898 Other symptoms and signs involving the musculoskeletal system: Secondary | ICD-10-CM

## 2021-11-15 NOTE — Therapy (Signed)
Referring patient to [name] for [facility].  Kristin Ballard MPT

## 2021-11-20 ENCOUNTER — Other Ambulatory Visit: Payer: Self-pay | Admitting: Obstetrics and Gynecology

## 2021-11-20 NOTE — Patient Instructions (Signed)
Hi Ms. Finlayson, I am sorry I missed you today- as a part of your Medicaid benefit, you are eligible for care management and care coordination services at no cost or copay. I was unable to reach you by phone today but would be happy to help you with your health related needs. Please feel free to call me at [phone].  A member of the Managed Medicaid care management team will reach out to you again over the next 30 business days.  Kathi Der RN, BSN [place]  [employer] - Managed Medicaid High Risk [phone].

## 2021-11-20 NOTE — Patient Outreach (Signed)
Care Coordination  11/20/2021  FLORENDA WATT [birth year] [id number]   Medicaid Managed Care   Unsuccessful Outreach Note  11/20/2021 Name: AMIYAH SHRYOCK MRN: [id number] DOB: 07-Feb-1986  Referred by: Junie Spencer, FNP Reason for referral : High Risk Managed Medicaid (Unsuccessful telephone outreach)   An unsuccessful telephone outreach was attempted today. The patient was referred to the case management team for assistance with care management and care coordination.   Follow Up Plan: The care management team will reach out to the patient again over the next 30 business  days.   Kathi Der RN, BSN [place]  [employer] - Managed Medicaid High Risk [phone].

## 2021-11-21 DIAGNOSIS — M62838 Other muscle spasm: Secondary | ICD-10-CM | POA: Diagnosis not present

## 2021-11-21 DIAGNOSIS — M792 Neuralgia and neuritis, unspecified: Secondary | ICD-10-CM | POA: Diagnosis not present

## 2021-11-21 DIAGNOSIS — Z7409 Other reduced mobility: Secondary | ICD-10-CM | POA: Diagnosis not present

## 2021-11-26 DIAGNOSIS — G629 Polyneuropathy, unspecified: Secondary | ICD-10-CM | POA: Diagnosis not present

## 2021-11-26 DIAGNOSIS — R6521 Severe sepsis with septic shock: Secondary | ICD-10-CM | POA: Diagnosis not present

## 2021-11-26 DIAGNOSIS — M6281 Muscle weakness (generalized): Secondary | ICD-10-CM | POA: Diagnosis not present

## 2021-11-26 DIAGNOSIS — S82402A Unspecified fracture of shaft of left fibula, initial encounter for closed fracture: Secondary | ICD-10-CM | POA: Diagnosis not present

## 2021-12-01 ENCOUNTER — Ambulatory Visit: Payer: Medicaid Other | Admitting: Family

## 2021-12-01 ENCOUNTER — Encounter: Payer: Self-pay | Admitting: Family

## 2021-12-01 VITALS — BP 126/85 | HR 100 | Temp 97.9°F | Resp 20

## 2021-12-01 DIAGNOSIS — K219 Gastro-esophageal reflux disease without esophagitis: Secondary | ICD-10-CM | POA: Diagnosis not present

## 2021-12-01 DIAGNOSIS — E559 Vitamin D deficiency, unspecified: Secondary | ICD-10-CM | POA: Diagnosis not present

## 2021-12-01 DIAGNOSIS — Z3009 Encounter for other general counseling and advice on contraception: Secondary | ICD-10-CM | POA: Diagnosis not present

## 2021-12-01 DIAGNOSIS — F320 Major depressive disorder, single episode, mild: Secondary | ICD-10-CM

## 2021-12-01 DIAGNOSIS — R748 Abnormal levels of other serum enzymes: Secondary | ICD-10-CM

## 2021-12-01 DIAGNOSIS — Z789 Other specified health status: Secondary | ICD-10-CM

## 2021-12-01 DIAGNOSIS — F17200 Nicotine dependence, unspecified, uncomplicated: Secondary | ICD-10-CM

## 2021-12-01 DIAGNOSIS — Z7409 Other reduced mobility: Secondary | ICD-10-CM

## 2021-12-01 LAB — PREGNANCY, URINE: Preg Test, Ur: NEGATIVE

## 2021-12-01 MED ORDER — DULOXETINE HCL 30 MG PO CPEP: 30.0000 mg | ORAL_CAPSULE | Freq: Every day | ORAL | 1 refills | Status: DC

## 2021-12-01 MED ORDER — FOLIC ACID 1 MG PO TABS: 1.0000 mg | ORAL_TABLET | Freq: Every day | ORAL | 3 refills | Status: DC

## 2021-12-01 MED ORDER — BACLOFEN 10 MG PO TABS
5.0000 mg | ORAL_TABLET | Freq: Four times a day (QID) | ORAL | 2 refills | Status: DC | PRN
Start: 2021-12-01 — End: 2022-01-29

## 2021-12-01 MED ORDER — VITAMIN D (ERGOCALCIFEROL) 1.25 MG (50000 UNIT) PO CAPS
50000.0000 [IU] | ORAL_CAPSULE | ORAL | 3 refills | Status: DC
Start: 2021-12-01 — End: 2023-02-06

## 2021-12-01 MED ORDER — CETIRIZINE HCL 10 MG PO TABS: 10.0000 mg | ORAL_TABLET | Freq: Every day | ORAL | 3 refills | Status: DC

## 2021-12-01 MED ORDER — NORGESTIMATE-ETH ESTRADIOL 0.25-35 MG-MCG PO TABS: 1.0000 | ORAL_TABLET | Freq: Every day | ORAL | 1 refills | Status: DC

## 2021-12-01 MED ORDER — OMEPRAZOLE 20 MG PO CPDR: 20.0000 mg | DELAYED_RELEASE_CAPSULE | Freq: Every day | ORAL | 1 refills | Status: DC

## 2021-12-01 NOTE — Progress Notes (Signed)
Subjective:    Patient ID: Kristin Ballard, female    DOB: 1986-01-22, 36 y.o.   MRN: [id number]  Chief Complaint  Patient presents with   Medical Management of Chronic Issues    Pt presents to the office today for chronic follow up and follow up for bilateral leg weakness. She had negative MRI. Referred to Neurologists who did a stat LP that was negative. She has an EMG and was positive for nerve involvement. 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    She reports she is having worsening leg weakness and they "will just give out".    She is followed by Ortho for  left fibula fx and ankle pain.    She was taking OC, but has been out 2-3 months.  Last menstrual cycle was June 18-July 7.  Gastroesophageal Reflux She complains of belching and heartburn. She reports no coughing. This is a chronic problem. The current episode started more than 1 year ago. The problem occurs occasionally. The symptoms are aggravated by smoking. Risk factors include obesity. She has tried a PPI for the symptoms. The treatment provided moderate relief.  Depression        This is a chronic problem.  Associated symptoms include no helplessness, no hopelessness and not sad.  Past treatments include SNRIs - Serotonin and norepinephrine reuptake inhibitors. Nicotine Dependence Presents for follow-up visit. Her urge triggers include company of smokers. The symptoms have been stable. She smokes < 1/2 a pack of cigarettes per day.      Review of Systems  Respiratory:  Negative for cough.   Gastrointestinal:  Positive for heartburn.  Psychiatric/Behavioral:  Positive for depression.   All other systems reviewed and are negative.      Objective:   Physical Exam Vitals reviewed.  Constitutional:      General: She is not in acute distress.    Appearance: She is well-developed. She is obese.  HENT:     Head: Normocephalic and atraumatic.     Right Ear: Tympanic membrane normal.     Left Ear: Tympanic  membrane normal.  Eyes:     Pupils: Pupils are equal, round, and reactive to light.  Neck:     Thyroid: No thyromegaly.  Cardiovascular:     Rate and Rhythm: Normal rate and regular rhythm.     Heart sounds: Normal heart sounds. No murmur heard. Pulmonary:     Effort: Pulmonary effort is normal. No respiratory distress.     Breath sounds: Normal breath sounds. No wheezing.  Abdominal:     General: Bowel sounds are normal. There is no distension.     Palpations: Abdomen is soft.     Tenderness: There is no abdominal tenderness.  Musculoskeletal:        General: No tenderness. Normal range of motion.     Cervical back: Normal range of motion and neck supple.  Skin:    General: Skin is warm and dry.  Neurological:     Mental Status: She is alert and oriented to person, place, and time.     Cranial Nerves: No cranial nerve deficit.     Motor: Weakness present.     Gait: Gait abnormal (in wheelchair).     Deep Tendon Reflexes: Reflexes are normal and symmetric.     Comments: Bilateral weakness in lower extremities with extension  Psychiatric:        Behavior: Behavior normal.        Thought Content: Thought content normal.  Judgment: Judgment normal.        BP 126/85   Pulse 100   Temp 97.9 F (36.6 C) (Oral)   Resp 20   SpO2 100%   Assessment & Plan:   Cecilton COHICK comes in today with chief complaint of Medical Management of Chronic Issues   Diagnosis and orders addressed:  1. Vitamin D deficiency - Vitamin D, Ergocalciferol, (DRISDOL) 1.25 MG (50000 UNIT) CAPS capsule; Take 1 capsule (50,000 Units total) by mouth every 7 (seven) days.  Dispense: 12 capsule; Refill: 3 - CBC with Differential/Platelet - BMP8+EGFR  2. Birth control counseling - norgestimate-ethinyl estradiol ([place] 28) 0.25-35 MG-MCG tablet; Take 1 tablet by mouth daily.  Dispense: 84 tablet; Refill: 1 - Pregnancy, urine - CBC with Differential/Platelet - BMP8+EGFR  3.  Gastroesophageal reflux disease, unspecified whether esophagitis present - omeprazole (PRILOSEC) 20 MG capsule; Take 1 capsule (20 mg total) by mouth daily.  Dispense: 90 capsule; Refill: 1 - CBC with Differential/Platelet - BMP8+EGFR  4. Depression, major, single episode, mild (HCC) - DULoxetine (CYMBALTA) 30 MG capsule; Take 1 capsule (30 mg total) by mouth daily.  Dispense: 90 capsule; Refill: 1 - CBC with Differential/Platelet - BMP8+EGFR  5. Morbid obesity ([place]) - CBC with Differential/Platelet - BMP8+EGFR  6. Impaired mobility and ADLs - CBC with Differential/Platelet - TSH - BMP8+EGFR  7. Current smoker - CBC with Differential/Platelet - BMP8+EGFR  8. Elevated liver enzymes - BMP8+EGFR - Hepatic function panel   Labs pending Health Maintenance reviewed Diet and exercise encouraged  Follow up plan: 6 months and keep follow up with Neurologists   Evelina Dun, FNP

## 2021-12-01 NOTE — Patient Instructions (Signed)
Guillain-Barre Syndrome Guillain-Barr syndrome (GBS) is a rare disorder in which the body's disease-fighting system (immune system) attacks the nerves. This causes numbness and tingling. It can eventually develop into a serious condition and, in some cases, cause paralysis. GBS does not spread from person to person (is not contagious). Most people with GBS recover within a few months. However, some people may have muscle weakness that becomes permanent. What are the causes? The exact cause of GBS is not known. In most cases, GBS develops a few days or weeks after you have an infection, such as: A stomach infection caused by Campylobacter bacteria. This type of infection usually causes diarrhea. An infection of the nose, throat, and upper airways (respiratory infection), such as a cold or the flu. A viral infection. Less commonly, GBS may be triggered by: Surgery. A vaccine. What are the signs or symptoms? The most common first symptoms are tingling, numbness, and weakness in the lower legs. Other symptoms may develop over hours, days, or weeks. These may include: Muscle weakness that spreads from the legs to the abdomen and then the arms. Aching or burning pain of the arms and legs. Inability to move the arms, legs, or both (paralysis). Weakness of muscles in the face. Trouble chewing or swallowing. Slurred speech. Difficulty breathing. How is this diagnosed? This condition may be diagnosed based on: Your symptoms and medical history. Your health care provider will ask about any recent infections you have had. A physical exam. A test that measures how quickly your nerves carry signals to your muscles (nerve conduction velocity test) and a test that measures how well the nerves and muscles communicate (electromyogram). Testing a sample of fluid that surrounds the spinal cord (lumbar puncture). How is this treated? If your symptoms are mild, you may be given medicines to control your  symptoms. If your condition becomes severe, you may need to be treated at a hospital. At the hospital, treatment may include: Medicines. These help to improve numbness or tingling sensations in your arms and legs (paresthesia) caused by irritation of the nerves. Plasma exchange (plasmapheresis). This is a procedure in which the antibodies that are attacking your nerves are removed from your blood. Getting proteins (immunoglobulins or antibodies) that slow down the immune system's attack on your nerves. These may be given through an IV line inserted into one of your veins. Oxygen and breathing assistance. Treatment may also include physical therapy to help strengthen your muscles. After being treated in the hospital, you may be transferred to a rehabilitation center to get intensive physical therapy. Once your strength improves, you may continue to get physical therapy at home. Additional physical therapy may be needed for many months. Follow these instructions at home:  If physical therapy was prescribed, do exercises as told by your health care provider. Make sure you have the support you need. Someone may need to help you bathe, dress, and prepare meals until you regain your strength. Rest as needed. Return to your normal activities as told by your health care provider. Ask your health care provider what activities are safe for you. Take over-the-counter and prescription medicines only as told by your health care provider. Keep all follow-up visits. This is important. Where to find more information GBS/CIDP [employer]: www.gbs-cidp.org Contact a health care provider if you: Have new symptoms or your symptoms get worse. Do not feel like you are getting better or regaining strength. Do not feel safe or supported at home. Are having trouble coping with your   recovery. Get help right away if you: Have trouble breathing or chest pain. Have trouble swallowing. Choke after eating or  drinking. Develop a fever. Cannot move. Faint or feel dizzy. Have pain, swelling, warmth, or redness in an arm or leg. These symptoms may be an emergency. Get help right away. Call 911. Do not wait to see if the symptoms will go away. Do not drive yourself to the hospital. Summary Guillain-Barr syndrome (GBS) is a rare disorder in which the body's immune system attacks the nerves. GBS often requires treatment at a hospital. Treatment in the hospital may include medicines, plasmapheresis, getting proteins that slow down the immune system's attack on your nerves, or oxygen and breathing assistance. Months of physical therapy may be needed until you regain your strength. This information is not intended to replace advice given to you by your health care provider. Make sure you discuss any questions you have with your health care provider. Document Revised: 10/18/2020 Document Reviewed: 10/18/2020 Elsevier Patient Education  2023 Elsevier Inc.  

## 2021-12-02 ENCOUNTER — Other Ambulatory Visit: Payer: Self-pay | Admitting: Nurse Practitioner

## 2021-12-04 ENCOUNTER — Ambulatory Visit (HOSPITAL_COMMUNITY): Payer: Medicaid Other

## 2021-12-04 LAB — CBC WITH DIFFERENTIAL/PLATELET
Basophils Absolute: 0 10*3/uL (ref 0.0–0.2)
Basos: 1 %
EOS (ABSOLUTE): 0.1 10*3/uL (ref 0.0–0.4)
Eos: 2 %
Hematocrit: 27.3 % — ABNORMAL LOW (ref 34.0–46.6)
Hemoglobin: 8.7 g/dL — CL (ref 11.1–15.9)
Immature Grans (Abs): 0 10*3/uL (ref 0.0–0.1)
Immature Granulocytes: 0 %
Lymphocytes Absolute: 2.3 10*3/uL (ref 0.7–3.1)
Lymphs: 53 %
MCH: 27.1 pg (ref 26.6–33.0)
MCHC: 31.9 g/dL (ref 31.5–35.7)
MCV: 85 fL (ref 79–97)
Monocytes Absolute: 0.4 10*3/uL (ref 0.1–0.9)
Monocytes: 9 %
Neutrophils Absolute: 1.5 10*3/uL (ref 1.4–7.0)
Neutrophils: 35 %
Platelets: 373 10*3/uL (ref 150–450)
RBC: 3.21 x10E6/uL — ABNORMAL LOW (ref 3.77–5.28)
RDW: 14.1 % (ref 11.7–15.4)
WBC: 4.2 10*3/uL (ref 3.4–10.8)

## 2021-12-04 LAB — TSH: TSH: 0.998 u[IU]/mL (ref 0.450–4.500)

## 2021-12-04 LAB — BMP8+EGFR
BUN/Creatinine Ratio: 10 (ref 9–23)
BUN: 7 mg/dL (ref 6–20)
CO2: 25 mmol/L (ref 20–29)
Calcium: 9.4 mg/dL (ref 8.7–10.2)
Chloride: 101 mmol/L (ref 96–106)
Creatinine, Ser: 0.73 mg/dL (ref 0.57–1.00)
Glucose: 93 mg/dL (ref 70–99)
Potassium: 4.3 mmol/L (ref 3.5–5.2)
Sodium: 139 mmol/L (ref 134–144)
eGFR: 108 mL/min/{1.73_m2} (ref >59)

## 2021-12-04 LAB — HEPATIC FUNCTION PANEL
ALT: 19 IU/L (ref 0–32)
AST: 29 IU/L (ref 0–40)
Albumin: 3.9 g/dL (ref 3.9–4.9)
Alkaline Phosphatase: 93 IU/L (ref 44–121)
Bilirubin Total: 0.3 mg/dL (ref 0.0–1.2)
Bilirubin, Direct: 0.11 mg/dL (ref 0.00–0.40)
Total Protein: 6.8 g/dL (ref 6.0–8.5)

## 2021-12-05 ENCOUNTER — Telehealth: Payer: Self-pay | Admitting: Family

## 2021-12-05 MED ORDER — HYDROXYZINE HCL 25 MG PO TABS: 25.0000 mg | ORAL_TABLET | Freq: Three times a day (TID) | ORAL | 0 refills | Status: DC | PRN

## 2021-12-05 NOTE — Telephone Encounter (Signed)
Patient aware rx sent to pharmacy.  

## 2021-12-06 LAB — B12 AND FOLATE PANEL
Folate: >20 ng/mL (ref >3.0)
Vitamin B-12: 216 pg/mL — ABNORMAL LOW (ref 232–1245)

## 2021-12-06 LAB — IRON AND TIBC
Iron Saturation: 4 % — CL (ref 15–55)
Iron: 20 ug/dL — ABNORMAL LOW (ref 27–159)
Total Iron Binding Capacity: 469 ug/dL — ABNORMAL HIGH (ref 250–450)
UIBC: 449 ug/dL — ABNORMAL HIGH (ref 131–425)

## 2021-12-06 LAB — SPECIMEN STATUS REPORT

## 2021-12-08 ENCOUNTER — Ambulatory Visit: Payer: Medicaid Other

## 2021-12-12 ENCOUNTER — Ambulatory Visit (HOSPITAL_COMMUNITY): Payer: Medicaid Other | Attending: Nurse Practitioner | Admitting: Physical Therapy

## 2021-12-12 ENCOUNTER — Encounter (HOSPITAL_COMMUNITY): Payer: Self-pay | Admitting: Physical Therapy

## 2021-12-12 DIAGNOSIS — R262 Difficulty in walking, not elsewhere classified: Secondary | ICD-10-CM | POA: Diagnosis not present

## 2021-12-12 DIAGNOSIS — R29898 Other symptoms and signs involving the musculoskeletal system: Secondary | ICD-10-CM | POA: Diagnosis not present

## 2021-12-12 NOTE — Therapy (Signed)
OUTPATIENT PHYSICAL THERAPY NEURO EVALUATION   Patient Name: Kristin Ballard MRN: [id number] DOB:November 25, 1985, 36 y.o., female Today's Date: 12/12/2021   PCP: Jannifer Rodney, FNP REFERRING PROVIDER: Bennie Pierini, FNP    PT End of Session - 12/12/21 1743     Visit Number 1    Number of Visits 8    Date for PT Re-Evaluation 02/06/22    Authorization Type [place] [employer]    Authorization Time Period Auth submitted, req 8 weeks 1x/week    Authorization - Visit Number 1    Progress Note Due on Visit 8    PT Start Time 1602    PT Stop Time 1645    PT Time Calculation (min) 43 min    Activity Tolerance Patient tolerated treatment well    Behavior During Therapy [facility] for tasks assessed/performed             Past Medical History:  Diagnosis Date   Allergy    Functional ovarian cysts    Migraines    Past Surgical History:  Procedure Laterality Date   WISDOM TOOTH EXTRACTION  2011   Patient Active Problem List   Diagnosis Date Noted   Impaired mobility and ADLs 12/01/2021   Birth control counseling 10/04/2020   Depression, major, single episode, mild (HCC) 09/15/2019   Morbid obesity (HCC) 07/05/2017   GERD (gastroesophageal reflux disease) 07/05/2017   Current smoker 08/29/2016   Vitamin D deficiency 05/20/2014    ONSET DATE: March 2023  REFERRING DIAG: R29.898 (ICD-10-CM) - Weakness of both lower extremities   THERAPY DIAG:  Weakness of both lower extremities  Difficulty walking  Rationale for Evaluation and Treatment Rehabilitation  SUBJECTIVE:                                                                                                                                                                                              SUBJECTIVE STATEMENT: Patient reports she became septic June 15, and hospitalized at this time through July 18th. 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 Symptoms truly began in March but  progressively worsened. States she was not intubated. Completed inpatient rehab at [facility]. Still experiencing numbness in fingertips and LEs below the knees BIL. States LEs still weak. Walking up to 150 feet in inpatient rehab with RW before she left. Pt accompanied by: significant other  PERTINENT HISTORY: Guillain Barre  PAIN:  Are you having pain? Yes: NPRS scale: 5/10 Pain location: BIl feet Pain description: numbness, tingling, burning sensation, asleep Aggravating factors: none Relieving factors: none  PRECAUTIONS: None  WEIGHT BEARING RESTRICTIONS No  FALLS: Has  patient fallen in last 6 months? No  LIVING ENVIRONMENT: Lives with: lives with their family and lives with their spouse Lives in: House/apartment Stairs: Yes: External: 4-5 steps; none Has following equipment at home: Environmental consultant - 2 wheeled, Crutches, and Wheelchair (manual)  PLOF: Independent  PATIENT GOALS Be able to walk longer distance, stop dropping things.  OBJECTIVE:   DIAGNOSTIC FINDINGS: EMG results is suggestive of a variant of Guillain Barre Syndrome.  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   Disc bulging and facet hypertrophy combine for up to moderate neural foraminal stenosis at the left L3 and right L4 nerve levels. Mild left L4 foraminal stenosis.  IMPRESSION: Normal brain MRI.  No acute abnormality.  COGNITION: Overall cognitive status: Within functional limits for tasks assessed   SENSATION: Light touch: Impaired  impaired more so in feet and toes, fingertips - also hypersensitive in areas of toes.    DTRs:  Patella 1 = Trace     LOWER EXTREMITY ROM:     WNL  LOWER EXTREMITY MMT:    MMT Right Eval Left Eval  Hip flexion 4- 4-  Hip extension 4 4  Hip abduction 5 5  Hip adduction    Hip internal rotation    Hip external rotation    Knee flexion 4+ 4  Knee extension 5 4+  Ankle dorsiflexion 4- 4-  Ankle plantarflexion     Ankle inversion    Ankle eversion    (Blank rows = not tested)  UPPER EXTREMITY GRIP STRENGTH: Hand Dynamometer left 19 kg, Rt 18 kg (she is right hand dominant)   TRANSFERS: Modified Independent    STAIRS:  Level of Assistance: Min A  Stair Negotiation Technique: Step to Pattern with No Rails (presses through husbands leg)  Number of Stairs: 5   Height of Stairs: 7"  Comments: requires assist at home  GAIT: Gait pattern: step through pattern, decreased stride length, decreased hip/knee flexion- Right, decreased hip/knee flexion- Left, decreased ankle dorsiflexion- Right, and decreased ankle dorsiflexion- Left Distance walked: 280 feet Assistive device utilized: None Level of assistance: Modified independence Comments: lacks full stride, reduced dorsiflexion BIL, no buckling noted  FUNCTIONAL TESTs:  5 times sit to stand: 19 sec from w/c no UE support 2 minute walk test: 280 feet, no AD  PATIENT SURVEYS:  LEFS 36 / 80 = 45.0 %  TODAY'S TREATMENT:  Eval Education MMT 5x Sit to stand HEP   PATIENT EDUCATION: Education details: Findings, POC, energy conservation Person educated: Patient and Spouse Education method: Explanation Education comprehension: verbalized understanding   HOME EXERCISE PROGRAM: Access Code: [email or web address] URL: [email or web address] Date: 12/12/2021 Prepared by: Kathlyn Sacramento  Exercises - Standing March with Counter Support  - 1-2 x daily - 7 x weekly - 1-2 sets - 5-10 reps - Standing Hip Abduction with Unilateral Counter Support  - 1-2 x daily - 7 x weekly - 1-2 sets - 5-10 reps - Heel Toe Raises with Unilateral Counter Support  - 1-2 x daily - 7 x weekly - 1-2 sets - 10 reps - Sit to Stand with Arms Crossed  - 1-2 x daily - 7 x weekly - 1-2 sets - 5-10 reps - Standing Knee Flexion with Unilateral Counter Support  - 1-2 x daily - 7 x weekly - 1-2 sets - 5-10 reps    GOALS: Goals reviewed with patient? Yes  SHORT  TERM GOALS: Target date: 01/09/22  Patient will be independent with initial HEP and  self-management strategies to improve functional outcomes Baseline: Initiated  Goal status: INITIAL    LONG TERM GOALS: Target date: 02/06/22  Patient will be independent with advanced HEP and self-management strategies to improve functional outcomes Baseline: none Goal status: INITIAL  2.  Patient will improve LEFS score by 9 points to indicate improvement in functional outcomes. Baseline: 36/80 Goal status: INITIAL  3.  Patient will improve 5 time sit to stand to <12 seconds to indicate improvement in functional abilities. Baseline: 19 seconds, no UE support Goal status: INITIAL  4. Patient will have equal to or > 4+/5 MMT throughout 5/5 BIL LEs to improve ability to perform functional mobility, stair ambulation and ADLs.  Baseline: See above Goal status: INITIAL  5. Patient will be able to ambulate at least 350 feet during with LRAD to demonstrate improved ability to perform functional mobility and associated tasks. Baseline: 280 feet no AD Goal status: INITIAL   ASSESSMENT:  CLINICAL IMPRESSION: Patient is a 36 y.o. female who was seen today for physical therapy evaluation and treatment for lower extremity weakness following a diagnosis of variant guillain barre syndrome. She underwent extensive inpatient rehab and has progressed a long way since being hospitalized however continues to demonstrate reduced sensation in feet and finger tips, increased pain due to numbness and paraesthesias, reduced strength, and decreased endurance. Deficits resulting in increased reliance on husband for ADLs, difficulty walking, requiring assistance with navigating steps to enter their home, and reduced functional capacity with home chores, caring for son, and being out in the community. Patient will likely benefit from skilled PT intervention to address said deficits and improve functional abilities towards  PLOF.   OBJECTIVE IMPAIRMENTS Abnormal gait, decreased activity tolerance, decreased balance, decreased endurance, decreased knowledge of condition, decreased mobility, difficulty walking, decreased strength, impaired sensation, impaired UE functional use, obesity, and pain.   ACTIVITY LIMITATIONS carrying, lifting, standing, squatting, stairs, and caring for others  PARTICIPATION LIMITATIONS: cleaning, laundry, driving, shopping, community activity, and yard work  PERSONAL FACTORS Time since onset of injury/illness/exacerbation are also affecting patient's functional outcome.   REHAB POTENTIAL: Excellent  CLINICAL DECISION MAKING: Stable/uncomplicated  EVALUATION COMPLEXITY: Low  PLAN: PT FREQUENCY: 1x/week  PT DURATION: 8 weeks  PLANNED INTERVENTIONS: Therapeutic exercises, Therapeutic activity, Neuromuscular re-education, Balance training, Gait training, Patient/Family education, Self Care, Joint mobilization, Stair training, Cryotherapy, Moist heat, Manual therapy, and Re-evaluation  PLAN FOR NEXT SESSION: Gradual progression of endurance followed by strength; monitor for signs of neural fatigue. (GBS.)    12/12/2021, 5:46 PM  Kathlyn Sacramento, PT, DPT Physical Therapist Acute Rehabilitation Services [facility] Outpatient Rehabilitation Services [facility]

## 2021-12-13 ENCOUNTER — Ambulatory Visit (INDEPENDENT_AMBULATORY_CARE_PROVIDER_SITE_OTHER): Payer: Medicaid Other | Admitting: *Deleted

## 2021-12-13 DIAGNOSIS — E538 Deficiency of other specified B group vitamins: Secondary | ICD-10-CM | POA: Diagnosis not present

## 2021-12-13 MED ORDER — CYANOCOBALAMIN 1000 MCG/ML IJ SOLN
1000.0000 ug | Freq: Every day | INTRAMUSCULAR | Status: AC
  Administered 2021-12-13 – 2021-12-18 (×4): 1000 ug via INTRAMUSCULAR

## 2021-12-14 ENCOUNTER — Other Ambulatory Visit: Payer: Self-pay | Admitting: Family

## 2021-12-14 ENCOUNTER — Ambulatory Visit (INDEPENDENT_AMBULATORY_CARE_PROVIDER_SITE_OTHER): Payer: Medicaid Other | Admitting: *Deleted

## 2021-12-14 ENCOUNTER — Other Ambulatory Visit: Payer: Self-pay

## 2021-12-14 DIAGNOSIS — E538 Deficiency of other specified B group vitamins: Secondary | ICD-10-CM

## 2021-12-14 DIAGNOSIS — D649 Anemia, unspecified: Secondary | ICD-10-CM

## 2021-12-15 ENCOUNTER — Ambulatory Visit (INDEPENDENT_AMBULATORY_CARE_PROVIDER_SITE_OTHER): Payer: Medicaid Other | Admitting: *Deleted

## 2021-12-15 DIAGNOSIS — E538 Deficiency of other specified B group vitamins: Secondary | ICD-10-CM | POA: Diagnosis not present

## 2021-12-15 LAB — FECAL OCCULT BLOOD, IMMUNOCHEMICAL: Fecal Occult Bld: NEGATIVE

## 2021-12-18 ENCOUNTER — Ambulatory Visit (INDEPENDENT_AMBULATORY_CARE_PROVIDER_SITE_OTHER): Payer: Medicaid Other | Admitting: *Deleted

## 2021-12-18 DIAGNOSIS — E538 Deficiency of other specified B group vitamins: Secondary | ICD-10-CM

## 2021-12-18 NOTE — Progress Notes (Signed)
Pt in today for B12. Given right deltoid. Pt tolerated well.

## 2021-12-19 ENCOUNTER — Ambulatory Visit (INDEPENDENT_AMBULATORY_CARE_PROVIDER_SITE_OTHER): Payer: Medicaid Other

## 2021-12-19 DIAGNOSIS — E538 Deficiency of other specified B group vitamins: Secondary | ICD-10-CM

## 2021-12-19 MED ORDER — CYANOCOBALAMIN 1000 MCG/ML IJ SOLN: 1000.0000 ug | Freq: Once | INTRAMUSCULAR | 0 refills | Status: DC

## 2021-12-19 MED ORDER — CYANOCOBALAMIN 1000 MCG/ML IJ SOLN
1000.0000 ug | Freq: Once | INTRAMUSCULAR | Status: AC
  Administered 2021-12-19: 1000 ug via INTRAMUSCULAR

## 2021-12-21 ENCOUNTER — Encounter (HOSPITAL_COMMUNITY): Payer: Self-pay | Admitting: Physical Therapy

## 2021-12-21 ENCOUNTER — Ambulatory Visit (HOSPITAL_COMMUNITY): Payer: Medicaid Other | Admitting: Physical Therapy

## 2021-12-21 DIAGNOSIS — R262 Difficulty in walking, not elsewhere classified: Secondary | ICD-10-CM | POA: Diagnosis not present

## 2021-12-21 DIAGNOSIS — R29898 Other symptoms and signs involving the musculoskeletal system: Secondary | ICD-10-CM | POA: Diagnosis not present

## 2021-12-21 NOTE — Therapy (Signed)
OUTPATIENT PHYSICAL THERAPY NEURO EVALUATION   Patient Name: Kristin Ballard MRN: [id number] DOB:05/16/1985, 36 y.o., female Today's Date: 12/21/2021   PCP: Jannifer Rodney, FNP REFERRING PROVIDER: Bennie Pierini, FNP    PT End of Session - 12/21/21 1645     Visit Number 2    Number of Visits 8    Date for PT Re-Evaluation 02/06/22    Authorization Type [place] Medicaid [employer]    Authorization Time Period 10 approved 9/5-12/3/23    Authorization - Visit Number 1    Authorization - Number of Visits 10    Progress Note Due on Visit 8    PT Start Time 1645    PT Stop Time 1723    PT Time Calculation (min) 38 min    Activity Tolerance Patient tolerated treatment well    Behavior During Therapy [facility] for tasks assessed/performed             Past Medical History:  Diagnosis Date   Allergy    Functional ovarian cysts    Migraines    Past Surgical History:  Procedure Laterality Date   WISDOM TOOTH EXTRACTION  2011   Patient Active Problem List   Diagnosis Date Noted   Impaired mobility and ADLs 12/01/2021   Birth control counseling 10/04/2020   Depression, major, single episode, mild (HCC) 09/15/2019   Morbid obesity (HCC) 07/05/2017   GERD (gastroesophageal reflux disease) 07/05/2017   Current smoker 08/29/2016   Vitamin D deficiency 05/20/2014    ONSET DATE: March 2023  REFERRING DIAG: R29.898 (ICD-10-CM) - Weakness of both lower extremities   THERAPY DIAG:  Weakness of both lower extremities  Difficulty walking  Rationale for Evaluation and Treatment Rehabilitation  SUBJECTIVE:                                                                                                                                                                                              SUBJECTIVE STATEMENT: Reports ongoing weakness in legs and fingers. Moved around a lot this weakness. Has been sore and tired this week. She has been doing HEP exercises with no issues.    Pt accompanied by: significant other  PERTINENT HISTORY: Guillain Barre  PAIN:  Are you having pain? Yes: NPRS scale: 5/10 Pain location: BIl feet Pain description: numbness, tingling, burning sensation, asleep Aggravating factors: none Relieving factors: none  PRECAUTIONS: None  WEIGHT BEARING RESTRICTIONS No  FALLS: Has patient fallen in last 6 months? No  LIVING ENVIRONMENT: Lives with: lives with their family and lives with their spouse Lives in: House/apartment Stairs: Yes: External: 4-5 steps; none Has following equipment  at home: Dan Humphreys - 2 wheeled, Crutches, and Wheelchair (manual)  PLOF: Independent  PATIENT GOALS Be able to walk longer distance, stop dropping things.  OBJECTIVE:   DIAGNOSTIC FINDINGS: EMG results is suggestive of a variant of Guillain Barre Syndrome.  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   Disc bulging and facet hypertrophy combine for up to moderate neural foraminal stenosis at the left L3 and right L4 nerve levels. Mild left L4 foraminal stenosis.  IMPRESSION: Normal brain MRI.  No acute abnormality.  COGNITION: Overall cognitive status: Within functional limits for tasks assessed   SENSATION: Light touch: Impaired  impaired more so in feet and toes, fingertips - also hypersensitive in areas of toes.    DTRs:  Patella 1 = Trace     LOWER EXTREMITY ROM:     WNL  LOWER EXTREMITY MMT:    MMT Right Eval Left Eval  Hip flexion 4- 4-  Hip extension 4 4  Hip abduction 5 5  Hip adduction    Hip internal rotation    Hip external rotation    Knee flexion 4+ 4  Knee extension 5 4+  Ankle dorsiflexion 4- 4-  Ankle plantarflexion    Ankle inversion    Ankle eversion    (Blank rows = not tested)  UPPER EXTREMITY GRIP STRENGTH: Hand Dynamometer left 19 kg, Rt 18 kg (she is right hand dominant)   TRANSFERS: Modified Independent    STAIRS:  Level of Assistance: Min  A  Stair Negotiation Technique: Step to Pattern with No Rails (presses through husbands leg)  Number of Stairs: 5   Height of Stairs: 7"  Comments: requires assist at home  GAIT: Gait pattern: step through pattern, decreased stride length, decreased hip/knee flexion- Right, decreased hip/knee flexion- Left, decreased ankle dorsiflexion- Right, and decreased ankle dorsiflexion- Left Distance walked: 280 feet Assistive device utilized: None Level of assistance: Modified independence Comments: lacks full stride, reduced dorsiflexion BIL, no buckling noted  FUNCTIONAL TESTs:  5 times sit to stand: 19 sec from w/c no UE support 2 minute walk test: 280 feet, no AD  PATIENT SURVEYS:  LEFS 36 / 80 = 45.0 %  TODAY'S TREATMENT:  12/21/21 Goal review Sit to stand 2 x 10 (arms crossed) Heel raise 2 x 10 Mini squats counter support 2 x 10 Standing march 2 x 10 Standing hip abduction 2 x 10 Standing hip extension 2 x 10 Semi tandem stance 2 x 15"  Nustep lv 3 (seat 7) 6 min EOS for strength and conditioning    Eval Education MMT 5x Sit to stand HEP   PATIENT EDUCATION: Education details: Findings, POC, energy conservation Person educated: Patient and Spouse Education method: Explanation Education comprehension: verbalized understanding   HOME EXERCISE PROGRAM: Access Code: [email or web address] URL: [email or web address]  12/21/21 - Mini Squat with Counter Support  - 1-2 x daily - 7 x weekly - 2 sets - 10 reps - Standing Hip Extension with Counter Support  - 1-2 x daily - 7 x weekly - 2 sets - 10 reps - Standing Tandem Balance with Counter Support  - 1-2 x daily - 7 x weekly - 1 sets - 3 reps - 20-30 second hold  Date: 12/12/2021 Prepared by: Kathlyn Sacramento  Exercises - Standing March with Counter Support  - 1-2 x daily - 7 x weekly - 1-2 sets - 5-10 reps - Standing Hip Abduction with Unilateral Counter Support  - 1-2 x daily - 7 x  weekly - 1-2 sets - 5-10 reps -  Heel Toe Raises with Unilateral Counter Support  - 1-2 x daily - 7 x weekly - 1-2 sets - 10 reps - Sit to Stand with Arms Crossed  - 1-2 x daily - 7 x weekly - 1-2 sets - 5-10 reps - Standing Knee Flexion with Unilateral Counter Support  - 1-2 x daily - 7 x weekly - 1-2 sets - 5-10 reps    GOALS: Goals reviewed with patient? Yes  SHORT TERM GOALS: Target date: 01/09/22  Patient will be independent with initial HEP and self-management strategies to improve functional outcomes Baseline: Initiated  Goal status: INITIAL    LONG TERM GOALS: Target date: 02/06/22  Patient will be independent with advanced HEP and self-management strategies to improve functional outcomes Baseline: none Goal status: INITIAL  2.  Patient will improve LEFS score by 9 points to indicate improvement in functional outcomes. Baseline: 36/80 Goal status: INITIAL  3.  Patient will improve 5 time sit to stand to <12 seconds to indicate improvement in functional abilities. Baseline: 19 seconds, no UE support Goal status: INITIAL  4. Patient will have equal to or > 4+/5 MMT throughout 5/5 BIL LEs to improve ability to perform functional mobility, stair ambulation and ADLs.  Baseline: See above Goal status: INITIAL  5. Patient will be able to ambulate at least 350 feet during with LRAD to demonstrate improved ability to perform functional mobility and associated tasks. Baseline: 280 feet no AD Goal status: INITIAL  ASSESSMENT:  CLINICAL IMPRESSION:  Reviewed therapy goals and HEP. Patient tolerated session well today. Progressed LE strengthening exercises and added in balance activity . Patient noting fatigue throughout session and does require breaks for rest. Patient challenged with balance activity and modified tandem standing to semi tandem for HEP. Patient will continue to benefit from skilled therapy services to reduce remaining deficits and improve functional ability.    OBJECTIVE IMPAIRMENTS  Abnormal gait, decreased activity tolerance, decreased balance, decreased endurance, decreased knowledge of condition, decreased mobility, difficulty walking, decreased strength, impaired sensation, impaired UE functional use, obesity, and pain.   ACTIVITY LIMITATIONS carrying, lifting, standing, squatting, stairs, and caring for others  PARTICIPATION LIMITATIONS: cleaning, laundry, driving, shopping, community activity, and yard work  PERSONAL FACTORS Time since onset of injury/illness/exacerbation are also affecting patient's functional outcome.   REHAB POTENTIAL: Excellent  CLINICAL DECISION MAKING: Stable/uncomplicated  EVALUATION COMPLEXITY: Low  PLAN: PT FREQUENCY: 1x/week  PT DURATION: 8 weeks  PLANNED INTERVENTIONS: Therapeutic exercises, Therapeutic activity, Neuromuscular re-education, Balance training, Gait training, Patient/Family education, Self Care, Joint mobilization, Stair training, Cryotherapy, Moist heat, Manual therapy, and Re-evaluation  PLAN FOR NEXT SESSION: Gradual progression of endurance followed by strength; monitor for signs of neural fatigue. (GBS.)  4:48 PM, 12/21/21 Georges Lynch PT DPT  Physical Therapist with [place]  [facility]  [phone]

## 2021-12-25 ENCOUNTER — Other Ambulatory Visit: Payer: Self-pay | Admitting: Family

## 2021-12-26 ENCOUNTER — Ambulatory Visit (HOSPITAL_COMMUNITY): Payer: Medicaid Other | Admitting: Physical Therapy

## 2021-12-26 DIAGNOSIS — R262 Difficulty in walking, not elsewhere classified: Secondary | ICD-10-CM

## 2021-12-26 DIAGNOSIS — R29898 Other symptoms and signs involving the musculoskeletal system: Secondary | ICD-10-CM | POA: Diagnosis not present

## 2021-12-26 NOTE — Therapy (Signed)
OUTPATIENT PHYSICAL THERAPY TREATMENT  Patient Name: Kristin Ballard MRN: [id number] DOB:03/11/1986, 36 y.o., female Today's Date: 12/26/2021   PCP: Evelina Dun, FNP REFERRING PROVIDER: Chevis Pretty, FNP    PT End of Session - 12/26/21 1613     Visit Number 3    Number of Visits 8    Date for PT Re-Evaluation 02/06/22    Authorization Type [place] [employer]    Authorization Time Period 10 approved 9/5-12/3/23    Authorization - Visit Number 2    Authorization - Number of Visits 10    Progress Note Due on Visit 8    PT Start Time 1604    PT Stop Time 1645    PT Time Calculation (min) 41 min    Activity Tolerance Patient tolerated treatment well    Behavior During Therapy [facility] for tasks assessed/performed             Past Medical History:  Diagnosis Date   Allergy    Functional ovarian cysts    Migraines    Past Surgical History:  Procedure Laterality Date   WISDOM TOOTH EXTRACTION  2011   Patient Active Problem List   Diagnosis Date Noted   Impaired mobility and ADLs 12/01/2021   Birth control counseling 10/04/2020   Depression, major, single episode, mild ([place]) 09/15/2019   Morbid obesity ([place]) 07/05/2017   GERD (gastroesophageal reflux disease) 07/05/2017   Current smoker 08/29/2016   Vitamin D deficiency 05/20/2014    ONSET DATE: March 2023  REFERRING DIAG: R29.898 (ICD-10-CM) - Weakness of both lower extremities   THERAPY DIAG:  Weakness of both lower extremities  Difficulty walking  Rationale for Evaluation and Treatment Rehabilitation  SUBJECTIVE:                                                                                                                                                                                              SUBJECTIVE STATEMENT: Reports her feet always hurt her and feel numb and tingly.  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 Yes: NPRS scale: 5/10 Pain location: BIl feet Pain description: numbness, tingling, burning sensation, asleep Aggravating factors: none Relieving factors: none  PRECAUTIONS: None  WEIGHT BEARING RESTRICTIONS No  FALLS: Has patient fallen in last 6 months? No  LIVING ENVIRONMENT: Lives with: lives with their family and lives with their spouse Lives in: House/apartment Stairs: Yes: External: 4-5 steps; none Has following equipment at home: Gilford Rile - 2 wheeled, Crutches, and Wheelchair (manual)  PLOF: Independent  PATIENT GOALS Be able to walk longer distance, stop dropping things.  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   Disc bulging and facet hypertrophy combine for up to moderate neural foraminal stenosis at the left L3 and right L4 nerve levels. Mild left L4 foraminal stenosis.  IMPRESSION: Normal brain MRI.  No acute abnormality.  COGNITION: Overall cognitive status: Within functional limits for tasks assessed   SENSATION: Light touch: Impaired  impaired more so in feet and toes, fingertips - also hypersensitive in areas of toes.    DTRs:  Patella 1 = Trace     LOWER EXTREMITY ROM:     WNL  LOWER EXTREMITY MMT:    MMT Right Eval Left Eval  Hip flexion 4- 4-  Hip extension 4 4  Hip abduction 5 5  Hip adduction    Hip internal rotation    Hip external rotation    Knee flexion 4+ 4  Knee extension 5 4+  Ankle dorsiflexion 4- 4-  Ankle plantarflexion    Ankle inversion    Ankle eversion    (Blank rows = not tested)  UPPER EXTREMITY GRIP STRENGTH: Hand Dynamometer left 19 kg, Rt 18 kg (she is right hand dominant)   TRANSFERS: Modified Independent    STAIRS:  Level of Assistance: Min A  Stair Negotiation Technique: Step to  Pattern with No Rails (presses through husbands leg)  Number of Stairs: 5   Height of Stairs: 7"  Comments: requires assist at home  GAIT: Gait pattern: step through pattern, decreased stride length, decreased hip/knee flexion- Right, decreased hip/knee flexion- Left, decreased ankle dorsiflexion- Right, and decreased ankle dorsiflexion- Left Distance walked: 280 feet Assistive device utilized: None Level of assistance: Modified independence Comments: lacks full stride, reduced dorsiflexion BIL, no buckling noted  FUNCTIONAL TESTs:  5 times sit to stand: 19 sec from w/c no UE support 2 minute walk test: 280 feet, no AD  PATIENT SURVEYS:  LEFS 36 / 80 = 45.0 %  TODAY'S TREATMENT:  12/26/21 Nustep lev 3 5 minutes UE/LE Standing:  heelraises 20X  Mini squats 20X with 1 UE assist  Alternating marches 20X with 1 UE assist  Hip abduction 20X each  Hip extension 20X each  Forward lunges onto 4" step 1 UE assist 20X each  Tandem stance no UE 30" each LE lead  Forward step ups 4" 20X each with 1 UE assist  Lateral step ups 4" 20X each with 1 UE assist  Vector stance with 1 UE 5X5" each LE Sit to stand no UE 2X10 (UE's crossed)  12/21/21 Goal review Sit to stand 2 x 10 (arms crossed) Heel raise 2 x 10 Mini squats counter support 2 x 10 Standing march 2 x 10 Standing hip abduction 2 x 10 Standing hip extension 2 x 10 Semi tandem stance 2 x 15" Nustep lv 3 (seat 7) 6 min EOS for strength and conditioning   Eval Education MMT 2MWT 5x Sit to stand HEP   PATIENT EDUCATION: Education details: Findings, POC, energy conservation Person educated: Patient and Spouse Education method: Explanation Education comprehension: verbalized understanding   HOME EXERCISE PROGRAM: Access Code: [email or web address] URL: [email or web address]  12/21/21 - Mini Squat with Counter Support  - 1-2 x daily - 7 x weekly - 2 sets - 10 reps - Standing Hip Extension with  Counter Support  - 1-2 x  daily - 7 x weekly - 2 sets - 10 reps - Standing Tandem Balance with Counter Support  - 1-2 x daily - 7 x weekly - 1 sets - 3 reps - 20-30 second hold  Date: 12/12/2021 Prepared by: Candie Mile  Exercises - Standing March with Counter Support  - 1-2 x daily - 7 x weekly - 1-2 sets - 5-10 reps - Standing Hip Abduction with Unilateral Counter Support  - 1-2 x daily - 7 x weekly - 1-2 sets - 5-10 reps - Heel Toe Raises with Unilateral Counter Support  - 1-2 x daily - 7 x weekly - 1-2 sets - 10 reps - Sit to Stand with Arms Crossed  - 1-2 x daily - 7 x weekly - 1-2 sets - 5-10 reps - Standing Knee Flexion with Unilateral Counter Support  - 1-2 x daily - 7 x weekly - 1-2 sets - 5-10 reps    GOALS: Goals reviewed with patient? Yes  SHORT TERM GOALS: Target date: 01/09/22  Patient will be independent with initial HEP and self-management strategies to improve functional outcomes Baseline: Initiated  Goal status: IN PROGRESS    LONG TERM GOALS: Target date: 02/06/22  Patient will be independent with advanced HEP and self-management strategies to improve functional outcomes Baseline: none Goal status: IN PROGRESS  2.  Patient will improve LEFS score by 9 points to indicate improvement in functional outcomes. Baseline: 36/80 Goal status: IN PROGRESS  3.  Patient will improve 5 time sit to stand to <12 seconds to indicate improvement in functional abilities. Baseline: 19 seconds, no UE support Goal status: IN PROGRESS  4. Patient will have equal to or > 4+/5 MMT throughout 5/5 BIL LEs to improve ability to perform functional mobility, stair ambulation and ADLs.  Baseline: See above Goal status: IN PROGRESS  5. Patient will be able to ambulate at least 350 feet during 2MWT with LRAD to demonstrate improved ability to perform functional mobility and associated tasks. Baseline: 280 feet no AD Goal status: IN PROGRESS  ASSESSMENT:  CLINICAL IMPRESSION: Focused session on LE  strengthening and activity tolerance.  Pt able to complete full session today with one seated rest break.  Able to decrease to 1 UE assist with most exercises today and full 30" stance with tandem without UE.  Progressed with step ups, lunges and vector stance to increase glute and LE strength.  Patient showing signs of improvement this session.  Patient will continue to benefit from skilled therapy services to reduce remaining deficits and improve functional ability.    OBJECTIVE IMPAIRMENTS Abnormal gait, decreased activity tolerance, decreased balance, decreased endurance, decreased knowledge of condition, decreased mobility, difficulty walking, decreased strength, impaired sensation, impaired UE functional use, obesity, and pain.   ACTIVITY LIMITATIONS carrying, lifting, standing, squatting, stairs, and caring for others  PARTICIPATION LIMITATIONS: cleaning, laundry, driving, shopping, community activity, and yard work  PERSONAL FACTORS Time since onset of injury/illness/exacerbation are also affecting patient's functional outcome.   REHAB POTENTIAL: Excellent  CLINICAL DECISION MAKING: Stable/uncomplicated  EVALUATION COMPLEXITY: Low  PLAN: PT FREQUENCY: 1x/week  PT DURATION: 8 weeks  PLANNED INTERVENTIONS: Therapeutic exercises, Therapeutic activity, Neuromuscular re-education, Balance training, Gait training, Patient/Family education, Self Care, Joint mobilization, Stair training, Cryotherapy, Moist heat, Manual therapy, and Re-evaluation  PLAN FOR NEXT SESSION:  Gradual progression of endurance followed by strength; monitor for signs of neural fatigue. (GBS.)  4:14 PM, 12/26/21 Teena Irani, PTA/CLT [facility]  [place] Ph: [phone]

## 2021-12-27 ENCOUNTER — Ambulatory Visit (INDEPENDENT_AMBULATORY_CARE_PROVIDER_SITE_OTHER): Payer: Medicaid Other

## 2021-12-27 DIAGNOSIS — G629 Polyneuropathy, unspecified: Secondary | ICD-10-CM | POA: Diagnosis not present

## 2021-12-27 DIAGNOSIS — R6521 Severe sepsis with septic shock: Secondary | ICD-10-CM | POA: Diagnosis not present

## 2021-12-27 DIAGNOSIS — E538 Deficiency of other specified B group vitamins: Secondary | ICD-10-CM

## 2021-12-27 DIAGNOSIS — M6281 Muscle weakness (generalized): Secondary | ICD-10-CM | POA: Diagnosis not present

## 2021-12-27 DIAGNOSIS — S82402A Unspecified fracture of shaft of left fibula, initial encounter for closed fracture: Secondary | ICD-10-CM | POA: Diagnosis not present

## 2021-12-27 MED ORDER — CYANOCOBALAMIN 1000 MCG/ML IJ SOLN
1000.0000 ug | INTRAMUSCULAR | Status: AC
  Administered 2021-12-27 – 2022-01-11 (×3): 1000 ug via INTRAMUSCULAR

## 2021-12-27 NOTE — Progress Notes (Signed)
Cyanocobalamin injection given to right deltoid.  Patient tolerated well. 

## 2021-12-29 ENCOUNTER — Other Ambulatory Visit: Payer: Self-pay | Admitting: Obstetrics and Gynecology

## 2021-12-29 NOTE — Patient Instructions (Signed)
Hi Kristin Ballard, thanks for speaking with me today-have a nice afternoon and a great weekend.  Kristin Ballard was given information about Medicaid Managed Care team care coordination services as a part of their Healthy [facility] Medicaid benefit. [name] verbally consented to engagement with the [facility] Managed Care team.   If you are experiencing a medical emergency, please call 911 or report to your local emergency department or urgent care.   If you have a non-emergency medical problem during routine business hours, please contact your provider's office and ask to speak with a nurse.   For questions related to your Healthy [facility] health plan, please call: [phone] or visit the homepage here: [email or web address]  If you would like to schedule transportation through your Healthy [facility] plan, please call the following number at least 2 days in advance of your appointment: [phone]  For information about your ride after you set it up, call Ride Assist at [phone]. Use this number to activate a Will Call pickup, or if your transportation is late for a scheduled pickup. Use this number, too, if you need to make a change or cancel a previously scheduled reservation.  If you need transportation services right away, call [phone]. The after-hours call center is staffed 24 hours to handle ride assistance and urgent reservation requests (including discharges) 365 days a year. Urgent trips include sick visits, hospital discharge requests and life-sustaining treatment.  Call the [place] at [phone], at any time, 24 hours a day, 7 days a week. If you are in danger or need immediate medical attention call 911.  If you would like help to quit smoking, call 1-800-QUIT-NOW 250-387-7615) OR Espaol: 1-855-Djelo-Ya (8-850-277-4128) o para ms informacin haga clic aqu or Text READY to 200-400 to register via text  Ms.  Piche - following are the goals we discussed in your visit today:   Goals Addressed    Timeframe:  Long-Range Goal Priority:  High Start Date:        12/29/21                     Expected End Date:   ongoing                    Follow Up Date 01/31/22    - practice safe sex - schedule appointment for flu shot - schedule appointment for vaccines needed due to my age or health - schedule recommended health tests (blood work, mammogram, colonoscopy, pap test) - schedule and keep appointment for annual check-up    Why is this important?   Screening tests can find diseases early when they are easier to treat.  Your doctor or nurse will talk with you about which tests are important for you.  Getting shots for common diseases like the flu and shingles will help prevent them.    Patient verbalizes understanding of instructions and care plan provided today and agrees to view in [place]. Active MyChart status and patient understanding of how to access instructions and care plan via MyChart confirmed with patient.     The Managed Medicaid care management team will reach out to the patient again over the next 30 business  days.  The  Patient has been provided with contact information for the Managed Medicaid care management team and has been advised to call with any health related questions or concerns.   Aida Raider RN, BSN [place]  [facility] -  Managed Medicaid High Risk [phone]   Following is a copy of your plan of care:  Care Plan : RN Care Manager Plan of Care  Updates made by Danie Chandler, RN since 12/29/2021 12:00 AM     Problem: Health Promotion or Disease Self-Management (General Plan of Care)      Long-Range Goal: Chronic Disease Management and Care Coordination Needs   Start Date: 12/29/2021  Expected End Date: 03/30/2022  Priority: High  Note:   Current Barriers:  Knowledge Deficits related to plan of care for management  of Anxiety, Depression, GERD, and Tobacco Use, GBS Care Coordination needs related to Anxiety, Depression, GERD, and Tobacco Use, GBS Chronic Disease Management support and education needs related to Anxiety, Depression, GERD, and Tobacco Use, GBS 12/29/21:  patient attending PT once a week and seeing good results -has NEURO appt 01/11/22.  Patient provided with 1-800-QUITNOW number.  Declines need for SW at this time.   RNCM Clinical Goal(s):  Patient will verbalize understanding of plan for management of anxiety, depression, GERD, tobacco use, GBS as evidenced by patient report verbalize basic understanding of  anxiety, depression, GERD, tobacco use, GBS disease process and self health management plan as evidenced by patient report take all medications exactly as prescribed and will call provider for medication related questions as evidenced by patient report demonstrate understanding of rationale for each prescribed medication as evidenced by patient report attend all scheduled medical appointments as evidenced by patient report continue to work with RN Care Manager to address care management and care coordination needs related to  anxiety, depression, GERD, tobacco use, GBS as evidenced by adherence to CM Team Scheduled appointments through collaboration with RN Care manager, provider, and care team.   Interventions: Inter-disciplinary care team collaboration (see longitudinal plan of care) Evaluation of current treatment plan related to  self management and patient's adherence to plan as established by provider    (Status:  New goal.)  Long Term Goal Evaluation of current treatment plan related to  anxiety, depression, GERD, tobacco use, GBS , self-management and patient's adherence to plan as established by provider. Discussed plans with patient for ongoing care management follow up and provided patient with direct contact information for care management team Evaluation of current treatment  plan related to anxiety, depression, GERD, tobacco use, GBS and patient's adherence to plan as established by provider Reviewed medications with patient Reviewed scheduled/upcoming provider appointments  Discussed plans with patient for ongoing care management follow up and provided patient with direct contact information for care management team Assessed social determinant of health barriers  Patient Goals/Self-Care Activities: Take all medications as prescribed Attend all scheduled provider appointments Call pharmacy for medication refills 3-7 days in advance of running out of medications Perform all self care activities independently  Perform IADL's (shopping, preparing meals, housekeeping, managing finances) independently Call provider office for new concerns or questions   Follow Up Plan:  The patient has been provided with contact information for the care management team and has been advised to call with any health related questions or concerns.  The care management team will reach out to the patient again over the next 30 business  days.

## 2021-12-29 NOTE — Patient Outreach (Signed)
Medicaid Managed Care   Nurse Care Manager Note  12/29/2021 Name:  Kristin Ballard MRN:  [id number] DOB:  1985-07-04  Kristin Ballard is an 36 y.o. year old female who is a primary patient of Junie Spencer, FNP.  The [facility] Managed Care Coordination team was consulted for assistance with:    Chronic healthcare management needs, anxiety, depression, GERD, tobacco use, GBS  Ms. Tamez was given information about Medicaid Managed Care Coordination team services today. Kristin Ballard Patient agreed to services and verbal consent obtained.  Engaged with patient by telephone for initial visit in response to provider referral for case management and/or care coordination services.   Assessments/Interventions:  Review of past medical history, allergies, medications, health status, including review of consultants reports, laboratory and other test data, was performed as part of comprehensive evaluation and provision of chronic care management services.  SDOH (Social Determinants of Health) assessments and interventions performed: SDOH Interventions    Flowsheet Row Patient Outreach Telephone from 12/29/2021 in [facility] Coord[facility] Office Visit from 07/19/2021 in [facility] Office Visit from 09/15/2019 in [facility]  SDOH Interventions     Transportation Interventions Intervention Not Indicated -- --  Utilities Interventions Intervention Not Indicated -- --  Depression Interventions/Treatment  -- Patient refuses Treatment  [not taking prescribed medication] Medication     Care Plan  Allergies  Allergen Reactions   Latex     Medications Reviewed Today     Reviewed by Danie Chandler, RN (Registered Nurse) on 12/29/21 at 1257  Med List Status: <None>   Medication Order Taking? Sig Documenting Provider Last Dose Status Informant  baclofen (LIORESAL) 10 MG tablet [id number]  Take 0.5 tablets (5 mg total) by mouth  4 (four) times daily as needed. Jannifer Rodney A, FNP  Active   cetirizine (ZYRTEC) 10 MG tablet [id number]  Take 1 tablet (10 mg total) by mouth daily. Jannifer Rodney A, FNP  Active   cyanocobalamin (VITAMIN B12) injection 1,000 mcg [id number]   Jannifer Rodney A, FNP  Active   DULoxetine (CYMBALTA) 30 MG capsule [id number]  Take 1 capsule (30 mg total) by mouth daily. Jannifer Rodney A, FNP  Active   folic acid (FOLVITE) 1 MG tablet [id number]  Take 1 tablet (1 mg total) by mouth daily. Jannifer Rodney A, FNP  Active   gabapentin (NEURONTIN) 100 MG capsule [id number]  TAKE 1 CAPSULE BY MOUTH 3 TIMES DAILY FOR 7 DAYS, THEN 2 CAPSULES 3 TIMES DAILY FOR 7 DAYS, THEN 3 CAPSULES 3 TIMES DAILY Hawks, Christy A, FNP  Active   hydrOXYzine (ATARAX) 25 MG tablet [id number]  Take 1 tablet by mouth three times daily as needed Jannifer Rodney A, FNP  Active   magnesium oxide (MAG-OX) 400 (240 Mg) MG tablet [id number] No Take 2 tablets by mouth 2 (two) times daily as needed.  Patient not taking: Reported on 12/29/2021   [provider] Not Taking Active   norgestimate-ethinyl estradiol (SPRINTEC 28) 0.25-35 MG-MCG tablet [id number]  Take 1 tablet by mouth daily. Jannifer Rodney A, FNP  Active   omeprazole (PRILOSEC) 20 MG capsule [id number]  Take 1 capsule (20 mg total) by mouth daily. Jannifer Rodney A, FNP  Active   ondansetron (ZOFRAN) 4 MG tablet [id number]  Take 4 mg by mouth every 8 (eight) hours as needed. [provider]  Active   Vitamin D, Ergocalciferol, (DRISDOL) 1.25 MG (50000 UNIT) CAPS capsule [id number]  Take 1 capsule (  50,000 Units total) by mouth every 7 (seven) days. Junie Spencer, FNP  Active            Patient Active Problem List   Diagnosis Date Noted   Impaired mobility and ADLs 12/01/2021   Birth control counseling 10/04/2020   Depression, major, single episode, mild (HCC) 09/15/2019   Morbid obesity (HCC) 07/05/2017   GERD (gastroesophageal reflux disease) 07/05/2017    Current smoker 08/29/2016   Vitamin D deficiency 05/20/2014   Conditions to be addressed/monitored per PCP order:  Chronic healthcare management needs, anxiety, depression, GERD, tobacco use, GBS  Care Plan : RN Care Manager Plan of Care  Updates made by Danie Chandler, RN since 12/29/2021 12:00 AM     Problem: Health Promotion or Disease Self-Management (General Plan of Care)      Long-Range Goal: Chronic Disease Management and Care Coordination Needs   Start Date: 12/29/2021  Expected End Date: 03/30/2022  Priority: High  Note:   Current Barriers:  Knowledge Deficits related to plan of care for management of Anxiety, Depression, GERD, and Tobacco Use, GBS Care Coordination needs related to Anxiety, Depression, GERD, and Tobacco Use, GBS Chronic Disease Management support and education needs related to Anxiety, Depression, GERD, and Tobacco Use, GBS 12/29/21:  patient attending PT once a week and seeing good results -has NEURO appt 01/11/22.  Patient provided with 1-800-QUITNOW number.  Declines need for SW at this time.   RNCM Clinical Goal(s):  Patient will verbalize understanding of plan for management of anxiety, depression, GERD, tobacco use, GBS as evidenced by patient report verbalize basic understanding of  anxiety, depression, GERD, tobacco use, GBS disease process and self health management plan as evidenced by patient report take all medications exactly as prescribed and will call provider for medication related questions as evidenced by patient report demonstrate understanding of rationale for each prescribed medication as evidenced by patient report attend all scheduled medical appointments as evidenced by patient report continue to work with RN Care Manager to address care management and care coordination needs related to  anxiety, depression, GERD, tobacco use, GBS as evidenced by adherence to CM Team Scheduled appointments through collaboration with RN Care manager,  provider, and care team.   Interventions: Inter-disciplinary care team collaboration (see longitudinal plan of care) Evaluation of current treatment plan related to  self management and patient's adherence to plan as established by provider    (Status:  New goal.)  Long Term Goal Evaluation of current treatment plan related to  anxiety, depression, GERD, tobacco use, GBS , self-management and patient's adherence to plan as established by provider. Discussed plans with patient for ongoing care management follow up and provided patient with direct contact information for care management team Evaluation of current treatment plan related to anxiety, depression, GERD, tobacco use, GBS and patient's adherence to plan as established by provider Reviewed medications with patient Reviewed scheduled/upcoming provider appointments  Discussed plans with patient for ongoing care management follow up and provided patient with direct contact information for care management team Assessed social determinant of health barriers  Patient Goals/Self-Care Activities: Take all medications as prescribed Attend all scheduled provider appointments Call pharmacy for medication refills 3-7 days in advance of running out of medications Perform all self care activities independently  Perform IADL's (shopping, preparing meals, housekeeping, managing finances) independently Call provider office for new concerns or questions   Follow Up Plan:  The patient has been provided with contact information for the care management team and  has been advised to call with any health related questions or concerns.  The care management team will reach out to the patient again over the next 30 business  days.    Long-Range Goal: Establish Plan of Care for Chronic Disease Management Needs   Priority: High  Note:   Timeframe:  Long-Range Goal Priority:  High Start Date:        12/29/21                     Expected End Date:   ongoing                     Follow Up Date 02/01/22    - practice safe sex - schedule appointment for flu shot - schedule appointment for vaccines needed due to my age or health - schedule recommended health tests (blood work, mammogram, colonoscopy, pap test) - schedule and keep appointment for annual check-up    Why is this important?   Screening tests can find diseases early when they are easier to treat.  Your doctor or nurse will talk with you about which tests are important for you.  Getting shots for common diseases like the flu and shingles will help prevent them.     Follow Up:  Patient agrees to Care Plan and Follow-up.  Plan: The Managed Medicaid care management team will reach out to the patient again over the next 30 business  days. and The  Patient has been provided with contact information for the Managed Medicaid care management team and has been advised to call with any health related questions or concerns.  Date/time of next scheduled RN care management/care coordination outreach: 02/01/22 at 1230.

## 2022-01-03 ENCOUNTER — Ambulatory Visit (INDEPENDENT_AMBULATORY_CARE_PROVIDER_SITE_OTHER): Payer: Medicaid Other

## 2022-01-03 DIAGNOSIS — E538 Deficiency of other specified B group vitamins: Secondary | ICD-10-CM

## 2022-01-03 NOTE — Progress Notes (Signed)
Cyanocobalamin injection given to left deltoid.  Patient tolerated well. 

## 2022-01-05 ENCOUNTER — Ambulatory Visit (HOSPITAL_COMMUNITY): Payer: Medicaid Other | Admitting: Physical Therapy

## 2022-01-05 ENCOUNTER — Encounter (HOSPITAL_COMMUNITY): Payer: Self-pay | Admitting: Physical Therapy

## 2022-01-05 DIAGNOSIS — R29898 Other symptoms and signs involving the musculoskeletal system: Secondary | ICD-10-CM | POA: Diagnosis not present

## 2022-01-05 DIAGNOSIS — R262 Difficulty in walking, not elsewhere classified: Secondary | ICD-10-CM | POA: Diagnosis not present

## 2022-01-05 NOTE — Therapy (Signed)
OUTPATIENT PHYSICAL THERAPY TREATMENT  Patient Name: Kristin Ballard MRN: [id number] DOB:28-Apr-1985, 36 y.o., female Today's Date: 01/05/2022   PCP: Jannifer Rodney, FNP REFERRING PROVIDER: Bennie Pierini, FNP    PT End of Session - 01/05/22 1554     Visit Number 4    Number of Visits 8    Date for PT Re-Evaluation 02/06/22    Authorization Type [place] Medicaid [employer]    Authorization Time Period 10 approved 9/5-12/3/23    Authorization - Visit Number 3    Authorization - Number of Visits 10    Progress Note Due on Visit 8    PT Start Time 1553    PT Stop Time 1634    PT Time Calculation (min) 41 min    Activity Tolerance Patient tolerated treatment well    Behavior During Therapy [facility] for tasks assessed/performed             Past Medical History:  Diagnosis Date   Allergy    Functional ovarian cysts    Migraines    Past Surgical History:  Procedure Laterality Date   WISDOM TOOTH EXTRACTION  2011   Patient Active Problem List   Diagnosis Date Noted   Impaired mobility and ADLs 12/01/2021   Birth control counseling 10/04/2020   Depression, major, single episode, mild (HCC) 09/15/2019   Morbid obesity (HCC) 07/05/2017   GERD (gastroesophageal reflux disease) 07/05/2017   Current smoker 08/29/2016   Vitamin D deficiency 05/20/2014    ONSET DATE: March 2023  REFERRING DIAG: R29.898 (ICD-10-CM) - Weakness of both lower extremities   THERAPY DIAG:  Weakness of both lower extremities  Difficulty walking  Rationale for Evaluation and Treatment Rehabilitation  SUBJECTIVE:                                                                                                                                                                                              SUBJECTIVE STATEMENT: Patient reports she has been walking a bit more, finds balance exercises very challenging.   Using w/c less, and walking on stairs at home. Still numbness in the toes.   Pt  accompanied by: significant other  PERTINENT HISTORY: Guillain Barre diagnosis 6/23  PAIN:  Are you having pain? Yes: NPRS scale: 7/10 Pain location: BIl knees Pain description: Stiffness Aggravating factors: none Relieving factors: none  PRECAUTIONS: None  WEIGHT BEARING RESTRICTIONS No  FALLS: Has patient fallen in last 6 months? No  LIVING ENVIRONMENT: Lives with: lives with their family and lives with their spouse Lives in: House/apartment Stairs: Yes: External: 4-5 steps; none Has following equipment at home: Dan Humphreys -  2 wheeled, Crutches, and Wheelchair (manual)  PLOF: Independent  PATIENT GOALS Be able to walk longer distance, stop dropping things.  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   Disc bulging and facet hypertrophy combine for up to moderate neural foraminal stenosis at the left L3 and right L4 nerve levels. Mild left L4 foraminal stenosis.  IMPRESSION: Normal brain MRI.  No acute abnormality.  COGNITION: Overall cognitive status: Within functional limits for tasks assessed   SENSATION: Light touch: Impaired  impaired more so in feet and toes, fingertips - also hypersensitive in areas of toes.    DTRs:  Patella 1 = Trace     LOWER EXTREMITY ROM:     WNL  LOWER EXTREMITY MMT:    MMT Right Eval Left Eval  Hip flexion 4- 4-  Hip extension 4 4  Hip abduction 5 5  Hip adduction    Hip internal rotation    Hip external rotation    Knee flexion 4+ 4  Knee extension 5 4+  Ankle dorsiflexion 4- 4-  Ankle plantarflexion    Ankle inversion    Ankle eversion    (Blank rows = not tested)  UPPER EXTREMITY GRIP STRENGTH: Hand Dynamometer left 19 kg, Rt 18 kg (she is right hand dominant)   TRANSFERS: Modified Independent    STAIRS:  Level of Assistance: Min A  Stair  Negotiation Technique: Step to Pattern with No Rails (presses through husbands leg)  Number of Stairs: 5   Height of Stairs: 7"  Comments: requires assist at home  GAIT: Gait pattern: step through pattern, decreased stride length, decreased hip/knee flexion- Right, decreased hip/knee flexion- Left, decreased ankle dorsiflexion- Right, and decreased ankle dorsiflexion- Left Distance walked: 280 feet Assistive device utilized: None Level of assistance: Modified independence Comments: lacks full stride, reduced dorsiflexion BIL, no buckling noted  FUNCTIONAL TESTs:  5 times sit to stand: 19 sec from w/c no UE support 2 minute walk test: 280 feet, no AD  PATIENT SURVEYS:  LEFS 36 / 80 = 45.0 %  TODAY'S TREATMENT:  01/05/22 Nustep lev 3 5 minutes UE/LE Standing:  heelraises 20X  Mini squats 20X with 1 UE assist  Alternating marches 2# 20X with 1 UE assist  Hip abduction 2# 20X each 1 UE assist  Hip extension 2# 20X each 1 UE assist  Forward lunges onto 6" step 1 UE assist 20X each  Forward step ups 6" 20X each with 1 UE assist  Lateral step ups 6" 20X each with 1 UE assist  Retro box taps 6" 20x ea with 1UE assist  Tandem stance no UE 30" each LE lead  Vector stance with 1 UE 5X5" each LE  Tandem walk forward in // bars x 3RT  Heel walk forward in // bars x 2RT x2 HHA  Toe walk forward in // bars x 2RT x2 HHA Sit to stand no UE 2X10 (UE's crossed)  12/26/21 Nustep lev 3 5 minutes UE/LE Standing:  heelraises 20X  Mini squats 20X with 1 UE assist  Alternating marches 20X with 1 UE assist  Hip abduction 20X each  Hip extension 20X each  Forward lunges onto 4" step 1 UE assist 20X each  Tandem stance no UE 30" each LE lead  Forward step ups 4" 20X each with 1 UE assist  Lateral step ups  4" 20X each with 1 UE assist  Vector stance with 1 UE 5X5" each LE Sit to stand no UE 2X10 (UE's crossed)  12/21/21 Goal review Sit to stand 2 x 10 (arms crossed) Heel raise 2 x 10 Mini  squats counter support 2 x 10 Standing march 2 x 10 Standing hip abduction 2 x 10 Standing hip extension 2 x 10 Semi tandem stance 2 x 15" Nustep lv 3 (seat 7) 6 min EOS for strength and conditioning     PATIENT EDUCATION: Education details: Findings, POC, energy conservation Person educated: Patient and Spouse Education method: Explanation Education comprehension: verbalized understanding   HOME EXERCISE PROGRAM: Access Code: [email or web address] URL: [email or web address]  12/21/21 - Mini Squat with Counter Support  - 1-2 x daily - 7 x weekly - 2 sets - 10 reps - Standing Hip Extension with Counter Support  - 1-2 x daily - 7 x weekly - 2 sets - 10 reps - Standing Tandem Balance with Counter Support  - 1-2 x daily - 7 x weekly - 1 sets - 3 reps - 20-30 second hold  Date: 12/12/2021 Prepared by: Kathlyn [place]  Exercises - Standing March with Counter Support  - 1-2 x daily - 7 x weekly - 1-2 sets - 5-10 reps - Standing Hip Abduction with Unilateral Counter Support  - 1-2 x daily - 7 x weekly - 1-2 sets - 5-10 reps - Heel Toe Raises with Unilateral Counter Support  - 1-2 x daily - 7 x weekly - 1-2 sets - 10 reps - Sit to Stand with Arms Crossed  - 1-2 x daily - 7 x weekly - 1-2 sets - 5-10 reps - Standing Knee Flexion with Unilateral Counter Support  - 1-2 x daily - 7 x weekly - 1-2 sets - 5-10 reps    GOALS: Goals reviewed with patient? Yes  SHORT TERM GOALS: Target date: 01/09/22  Patient will be independent with initial HEP and self-management strategies to improve functional outcomes Baseline: Initiated  Goal status: IN PROGRESS    LONG TERM GOALS: Target date: 02/06/22  Patient will be independent with advanced HEP and self-management strategies to improve functional outcomes Baseline: none Goal status: IN PROGRESS  2.  Patient will improve LEFS score by 9 points to indicate improvement in functional outcomes. Baseline: 36/80 Goal status: IN PROGRESS  3.   Patient will improve 5 time sit to stand to <12 seconds to indicate improvement in functional abilities. Baseline: 19 seconds, no UE support Goal status: IN PROGRESS  4. Patient will have equal to or > 4+/5 MMT throughout 5/5 BIL LEs to improve ability to perform functional mobility, stair ambulation and ADLs.  Baseline: See above Goal status: IN PROGRESS  5. Patient will be able to ambulate at least 350 feet during with LRAD to demonstrate improved ability to perform functional mobility and associated tasks. Baseline: 280 feet no AD Goal status: IN PROGRESS  ASSESSMENT:  CLINICAL IMPRESSION: Progressed resistance with exercises for LE strength. Increased height of box for step exercises. Further challenges to static and dynamic balance with additional emphasis on reduced UE support throughout session as tolerated. Patient will continue to benefit from skilled physical therapy services to further improve independence with functional mobility.    OBJECTIVE IMPAIRMENTS Abnormal gait, decreased activity tolerance, decreased balance, decreased endurance, decreased knowledge of condition, decreased mobility, difficulty walking, decreased strength, impaired sensation, impaired UE functional use, obesity, and pain.   ACTIVITY LIMITATIONS carrying, lifting, standing, squatting, stairs, and caring  for others  PARTICIPATION LIMITATIONS: cleaning, laundry, driving, shopping, community activity, and yard work  PERSONAL FACTORS Time since onset of injury/illness/exacerbation are also affecting patient's functional outcome.   REHAB POTENTIAL: Excellent  CLINICAL DECISION MAKING: Stable/uncomplicated  EVALUATION COMPLEXITY: Low  PLAN: PT FREQUENCY: 1x/week  PT DURATION: 8 weeks  PLANNED INTERVENTIONS: Therapeutic exercises, Therapeutic activity, Neuromuscular re-education, Balance training, Gait training, Patient/Family education, Self Care, Joint mobilization, Stair training,  Cryotherapy, Moist heat, Manual therapy, and Re-evaluation  PLAN FOR NEXT SESSION:  Gradual progression of endurance followed by strength; monitor for signs of neural fatigue. (GBS.)  4:37 PM, 01/05/22 Teena Irani, PTA/CLT [place] Ph: [phone]

## 2022-01-10 ENCOUNTER — Other Ambulatory Visit: Payer: Self-pay | Admitting: Family

## 2022-01-10 ENCOUNTER — Ambulatory Visit (HOSPITAL_COMMUNITY): Payer: Medicaid Other | Attending: Nurse Practitioner | Admitting: Physical Therapy

## 2022-01-10 DIAGNOSIS — R29898 Other symptoms and signs involving the musculoskeletal system: Secondary | ICD-10-CM | POA: Insufficient documentation

## 2022-01-10 DIAGNOSIS — R262 Difficulty in walking, not elsewhere classified: Secondary | ICD-10-CM | POA: Diagnosis not present

## 2022-01-10 NOTE — Therapy (Signed)
OUTPATIENT PHYSICAL THERAPY TREATMENT  Patient Name: Kristin Ballard MRN: [id number] DOB:May 25, 1985, 36 y.o., female Today's Date: 01/10/2022   PCP: Jannifer Rodney, FNP REFERRING PROVIDER: Bennie Pierini, FNP    PT End of Session - 01/10/22 1603     Visit Number 5    Number of Visits 8    Date for PT Re-Evaluation 02/06/22    Authorization Type [place] [employer]    Authorization Time Period 10 approved 9/5-12/3/23    Authorization - Visit Number 4    Authorization - Number of Visits 10    Progress Note Due on Visit 8    PT Start Time 1605    PT Stop Time 1643    PT Time Calculation (min) 38 min    Activity Tolerance Patient tolerated treatment well    Behavior During Therapy [facility] for tasks assessed/performed             Past Medical History:  Diagnosis Date   Allergy    Functional ovarian cysts    Migraines    Past Surgical History:  Procedure Laterality Date   WISDOM TOOTH EXTRACTION  2011   Patient Active Problem List   Diagnosis Date Noted   Impaired mobility and ADLs 12/01/2021   Birth control counseling 10/04/2020   Depression, major, single episode, mild (HCC) 09/15/2019   Morbid obesity (HCC) 07/05/2017   GERD (gastroesophageal reflux disease) 07/05/2017   Current smoker 08/29/2016   Vitamin D deficiency 05/20/2014    ONSET DATE: March 2023  REFERRING DIAG: R29.898 (ICD-10-CM) - Weakness of both lower extremities   THERAPY DIAG:  Weakness of both lower extremities  Difficulty walking  Rationale for Evaluation and Treatment Rehabilitation  SUBJECTIVE:                                                                                                                                                                                              SUBJECTIVE STATEMENT: Doing more, walking more. Still having pain in legs/feet.  Pt accompanied by: significant other  PERTINENT HISTORY: Guillain Barre diagnosis 6/23  PAIN:  Are you having  pain? Yes: NPRS scale: 6/10 Pain location: Bil legs/ feet Pain description: Stiffness Aggravating factors: none Relieving factors: none  PRECAUTIONS: None  WEIGHT BEARING RESTRICTIONS No  FALLS: Has patient fallen in last 6 months? No  LIVING ENVIRONMENT: Lives with: lives with their family and lives with their spouse Lives in: House/apartment Stairs: Yes: External: 4-5 steps; none Has following equipment at home: Environmental consultant - 2 wheeled, Crutches, and Wheelchair (manual)  PLOF: Independent  PATIENT GOALS Be able to walk longer distance, stop dropping  things.  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   Disc bulging and facet hypertrophy combine for up to moderate neural foraminal stenosis at the left L3 and right L4 nerve levels. Mild left L4 foraminal stenosis.  IMPRESSION: Normal brain MRI.  No acute abnormality.  COGNITION: Overall cognitive status: Within functional limits for tasks assessed   SENSATION: Light touch: Impaired  impaired more so in feet and toes, fingertips - also hypersensitive in areas of toes.    DTRs:  Patella 1 = Trace     LOWER EXTREMITY ROM:     WNL  LOWER EXTREMITY MMT:    MMT Right Eval Left Eval  Hip flexion 4- 4-  Hip extension 4 4  Hip abduction 5 5  Hip adduction    Hip internal rotation    Hip external rotation    Knee flexion 4+ 4  Knee extension 5 4+  Ankle dorsiflexion 4- 4-  Ankle plantarflexion    Ankle inversion    Ankle eversion    (Blank rows = not tested)  UPPER EXTREMITY GRIP STRENGTH: Hand Dynamometer left 19 kg, Rt 18 kg (she is right hand dominant)   TRANSFERS: Modified Independent    STAIRS:  Level of Assistance: Min A  Stair Negotiation Technique: Step to Pattern with No Rails (presses through husbands leg)  Number of Stairs:  5   Height of Stairs: 7"  Comments: requires assist at home  GAIT: Gait pattern: step through pattern, decreased stride length, decreased hip/knee flexion- Right, decreased hip/knee flexion- Left, decreased ankle dorsiflexion- Right, and decreased ankle dorsiflexion- Left Distance walked: 280 feet Assistive device utilized: None Level of assistance: Modified independence Comments: lacks full stride, reduced dorsiflexion BIL, no buckling noted  FUNCTIONAL TESTs:  5 times sit to stand: 19 sec from w/c no UE support 2 minute walk test: 280 feet, no AD  PATIENT SURVEYS:  LEFS 36 / 80 = 45.0 %  TODAY'S TREATMENT:  01/10/22 Nustep lv 5 6 minutes UE/LE Standing:   Heel raises 20X Mini squats 20X with 1 UE assist Alternating marches 3# 30X with 1 UE assist Hip abduction 3# 30X each 1 UE assist Hip extension 3# 30X each 1 UE assist   Forward lunges onto 6" step 1 UE assist 20X each   Forward step ups 6" 20X each with 1 UE assist   Lateral step ups 6" 20X each with 1 UE assist   Tandem stance no UE 3 x30" each LE lead (1 set on foam)    Vector stance with 1 UE 5X5" each LE  01/05/22 Nustep lev 3 5 minutes UE/LE Standing:  heelraises 20X  Mini squats 20X with 1 UE assist  Alternating marches 2# 20X with 1 UE assist  Hip abduction 2# 20X each 1 UE assist  Hip extension 2# 20X each 1 UE assist  Forward lunges onto 6" step 1 UE assist 20X each  Forward step ups 6" 20X each with 1 UE assist  Lateral step ups 6" 20X each with 1 UE assist  Retro box taps 6" 20x ea with 1UE assist  Tandem stance no UE 30" each LE lead  Vector stance with 1 UE 5X5" each LE  Tandem walk forward in // bars x 3RT  Heel walk forward in // bars x 2RT x2 HHA  Toe walk forward in //  bars x 2RT x2 HHA Sit to stand no UE 2X10 (UE's crossed)  12/26/21 Nustep lev 3 5 minutes UE/LE Standing:  heelraises 20X  Mini squats 20X with 1 UE assist  Alternating marches 20X with 1 UE assist  Hip abduction 20X each  Hip  extension 20X each  Forward lunges onto 4" step 1 UE assist 20X each  Tandem stance no UE 30" each LE lead  Forward step ups 4" 20X each with 1 UE assist  Lateral step ups 4" 20X each with 1 UE assist  Vector stance with 1 UE 5X5" each LE Sit to stand no UE 2X10 (UE's crossed)   PATIENT EDUCATION: Education details: Findings, POC, energy conservation Person educated: Patient and Spouse Education method: Explanation Education comprehension: verbalized understanding   HOME EXERCISE PROGRAM: Access Code: [email or web address] URL: [email or web address]  12/21/21 - Mini Squat with Counter Support  - 1-2 x daily - 7 x weekly - 2 sets - 10 reps - Standing Hip Extension with Counter Support  - 1-2 x daily - 7 x weekly - 2 sets - 10 reps - Standing Tandem Balance with Counter Support  - 1-2 x daily - 7 x weekly - 1 sets - 3 reps - 20-30 second hold  Date: 12/12/2021 Prepared by: Kathlyn Sacramento  Exercises - Standing March with Counter Support  - 1-2 x daily - 7 x weekly - 1-2 sets - 5-10 reps - Standing Hip Abduction with Unilateral Counter Support  - 1-2 x daily - 7 x weekly - 1-2 sets - 5-10 reps - Heel Toe Raises with Unilateral Counter Support  - 1-2 x daily - 7 x weekly - 1-2 sets - 10 reps - Sit to Stand with Arms Crossed  - 1-2 x daily - 7 x weekly - 1-2 sets - 5-10 reps - Standing Knee Flexion with Unilateral Counter Support  - 1-2 x daily - 7 x weekly - 1-2 sets - 5-10 reps    GOALS: Goals reviewed with patient? Yes  SHORT TERM GOALS: Target date: 01/09/22  Patient will be independent with initial HEP and self-management strategies to improve functional outcomes Baseline: Initiated  Goal status: IN PROGRESS    LONG TERM GOALS: Target date: 02/06/22  Patient will be independent with advanced HEP and self-management strategies to improve functional outcomes Baseline: none Goal status: IN PROGRESS  2.  Patient will improve LEFS score by 9 points to indicate improvement  in functional outcomes. Baseline: 36/80 Goal status: IN PROGRESS  3.  Patient will improve 5 time sit to stand to <12 seconds to indicate improvement in functional abilities. Baseline: 19 seconds, no UE support Goal status: IN PROGRESS  4. Patient will have equal to or > 4+/5 MMT throughout 5/5 BIL LEs to improve ability to perform functional mobility, stair ambulation and ADLs.  Baseline: See above Goal status: IN PROGRESS  5. Patient will be able to ambulate at least 350 feet during with LRAD to demonstrate improved ability to perform functional mobility and associated tasks. Baseline: 280 feet no AD Goal status: IN PROGRESS  ASSESSMENT:  CLINICAL IMPRESSION: Good tolerance today. Patient requiring less rest periods. Able to increased resistance with added ankle weight as well as increased reps with standing LE strengthening. Patient continues to be most limited by balance. Mild fatigue noted end of session. Patient will continue to benefit from skilled therapy services to reduce remaining deficits and improve functional ability.    OBJECTIVE IMPAIRMENTS Abnormal gait, decreased activity tolerance, decreased balance,  decreased endurance, decreased knowledge of condition, decreased mobility, difficulty walking, decreased strength, impaired sensation, impaired UE functional use, obesity, and pain.   ACTIVITY LIMITATIONS carrying, lifting, standing, squatting, stairs, and caring for others  PARTICIPATION LIMITATIONS: cleaning, laundry, driving, shopping, community activity, and yard work  PERSONAL FACTORS Time since onset of injury/illness/exacerbation are also affecting patient's functional outcome.   REHAB POTENTIAL: Excellent  CLINICAL DECISION MAKING: Stable/uncomplicated  EVALUATION COMPLEXITY: Low  PLAN: PT FREQUENCY: 1x/week  PT DURATION: 8 weeks  PLANNED INTERVENTIONS: Therapeutic exercises, Therapeutic activity, Neuromuscular re-education, Balance training, Gait  training, Patient/Family education, Self Care, Joint mobilization, Stair training, Cryotherapy, Moist heat, Manual therapy, and Re-evaluation  PLAN FOR NEXT SESSION:  Gradual progression of endurance followed by strength; monitor for signs of neural fatigue. (GBS.)  4:03 PM, 01/10/22 Georges Lynch PT DPT  Physical Therapist with [place]  [facility]  [phone]

## 2022-01-11 ENCOUNTER — Ambulatory Visit (INDEPENDENT_AMBULATORY_CARE_PROVIDER_SITE_OTHER): Payer: Medicaid Other

## 2022-01-11 DIAGNOSIS — E538 Deficiency of other specified B group vitamins: Secondary | ICD-10-CM

## 2022-01-11 NOTE — Progress Notes (Signed)
Cyanocobalamin injection given to right deltoid.  Patient tolerated well. 

## 2022-01-15 ENCOUNTER — Other Ambulatory Visit: Payer: Self-pay | Admitting: Family

## 2022-01-17 ENCOUNTER — Ambulatory Visit (INDEPENDENT_AMBULATORY_CARE_PROVIDER_SITE_OTHER): Payer: Medicaid Other | Admitting: *Deleted

## 2022-01-17 ENCOUNTER — Encounter (HOSPITAL_COMMUNITY): Payer: Medicaid Other | Admitting: Physical Therapy

## 2022-01-17 DIAGNOSIS — E538 Deficiency of other specified B group vitamins: Secondary | ICD-10-CM | POA: Diagnosis not present

## 2022-01-17 MED ORDER — CYANOCOBALAMIN 1000 MCG/ML IJ SOLN
1000.0000 ug | Freq: Once | INTRAMUSCULAR | Status: AC
  Administered 2022-01-17: 1000 ug via INTRAMUSCULAR

## 2022-01-18 ENCOUNTER — Ambulatory Visit: Payer: Medicaid Other

## 2022-01-24 ENCOUNTER — Ambulatory Visit (HOSPITAL_COMMUNITY): Payer: Medicaid Other | Admitting: Physical Therapy

## 2022-01-24 DIAGNOSIS — R29898 Other symptoms and signs involving the musculoskeletal system: Secondary | ICD-10-CM

## 2022-01-24 DIAGNOSIS — R262 Difficulty in walking, not elsewhere classified: Secondary | ICD-10-CM | POA: Diagnosis not present

## 2022-01-24 NOTE — Therapy (Signed)
OUTPATIENT PHYSICAL THERAPY TREATMENT  Patient Name: Kristin Ballard MRN: [id number] DOB:09-07-1985, 36 y.o., female Today's Date: 01/24/2022   PCP: Jannifer Rodney, FNP REFERRING PROVIDER: Bennie Pierini, FNP    PT End of Session - 01/24/22 1602     Visit Number 6    Number of Visits 8    Date for PT Re-Evaluation 02/06/22    Authorization Type [place] [employer]    Authorization Time Period 10 approved 9/5-12/3/23    Authorization - Visit Number 5    Authorization - Number of Visits 10    Progress Note Due on Visit 8    PT Start Time 1602    PT Stop Time 1642    PT Time Calculation (min) 40 min    Activity Tolerance Patient tolerated treatment well    Behavior During Therapy [facility] for tasks assessed/performed             Past Medical History:  Diagnosis Date   Allergy    Functional ovarian cysts    Migraines    Past Surgical History:  Procedure Laterality Date   WISDOM TOOTH EXTRACTION  2011   Patient Active Problem List   Diagnosis Date Noted   Impaired mobility and ADLs 12/01/2021   Birth control counseling 10/04/2020   Depression, major, single episode, mild (HCC) 09/15/2019   Morbid obesity (HCC) 07/05/2017   GERD (gastroesophageal reflux disease) 07/05/2017   Current smoker 08/29/2016   Vitamin D deficiency 05/20/2014    ONSET DATE: March 2023  REFERRING DIAG: R29.898 (ICD-10-CM) - Weakness of both lower extremities   THERAPY DIAG:  Weakness of both lower extremities  Difficulty walking  Rationale for Evaluation and Treatment Rehabilitation  SUBJECTIVE:                                                                                                                                                                                              SUBJECTIVE STATEMENT: Pt states she's been a little more sore the last couple weeks as she's been walking down to the bus stop with her child and there's an incline.  Feet and legs still feel  about the same with occasional back pain.    Pt accompanied by: significant other  PERTINENT HISTORY: Guillain Barre diagnosis 6/23  PAIN:  Are you having pain? Yes: NPRS scale: 07/14/08 Pain location: Bil legs/ feet Pain description: Stiffness Aggravating factors: none Relieving factors: none  PRECAUTIONS: None  WEIGHT BEARING RESTRICTIONS No  FALLS: Has patient fallen in last 6 months? No  LIVING ENVIRONMENT: Lives with: lives with their family and lives with their spouse Lives in:  House/apartment Stairs: Yes: External: 4-5 steps; none Has following equipment at home: Walker - 2 wheeled, Crutches, and Wheelchair (manual)  PLOF: Independent  PATIENT GOALS Be able to walk longer distance, stop dropping things.  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   Disc bulging and facet hypertrophy combine for up to moderate neural foraminal stenosis at the left L3 and right L4 nerve levels. Mild left L4 foraminal stenosis.  IMPRESSION: Normal brain MRI.  No acute abnormality.  COGNITION: Overall cognitive status: Within functional limits for tasks assessed   SENSATION: Light touch: Impaired  impaired more so in feet and toes, fingertips - also hypersensitive in areas of toes.    DTRs:  Patella 1 = Trace     LOWER EXTREMITY ROM:     WNL  LOWER EXTREMITY MMT:    MMT Right Eval Left Eval  Hip flexion 4- 4-  Hip extension 4 4  Hip abduction 5 5  Hip adduction    Hip internal rotation    Hip external rotation    Knee flexion 4+ 4  Knee extension 5 4+  Ankle dorsiflexion 4- 4-  Ankle plantarflexion    Ankle inversion    Ankle eversion    (Blank rows = not tested)  UPPER EXTREMITY GRIP STRENGTH: Hand Dynamometer left 19 kg, Rt 18 kg (she is right hand dominant)   TRANSFERS: Modified  Independent    STAIRS:  Level of Assistance: Min A  Stair Negotiation Technique: Step to Pattern with No Rails (presses through husbands leg)  Number of Stairs: 5   Height of Stairs: 7"  Comments: requires assist at home  GAIT: Gait pattern: step through pattern, decreased stride length, decreased hip/knee flexion- Right, decreased hip/knee flexion- Left, decreased ankle dorsiflexion- Right, and decreased ankle dorsiflexion- Left Distance walked: 280 feet Assistive device utilized: None Level of assistance: Modified independence Comments: lacks full stride, reduced dorsiflexion BIL, no buckling noted  FUNCTIONAL TESTs:  5 times sit to stand: 19 sec from w/c no UE support 2 minute walk test: 280 feet, no AD  PATIENT SURVEYS:  LEFS 36 / 80 = 45.0 %  TODAY'S TREATMENT:  01/24/22 200 feet ambulation warmup in hallway no AD Standing:  heelraises 20X  Squats 20X 1 UE assist  Alternating march 3# 30X each with 1 UE assist  Hip abduction 3# 30X with 1 UE assist  Hip extension 3# 30X with 1 UE assist  Vector stance with 1 UE assist 10X5" holds  Tandem stance on blue foam 2X30" each   Forward step ups 6" 20X each with 1 UE assist  Lateral step ups 6" 20X each with 1 UE assist  Side stepping with RTB 60 feet Sit to stands no UE 3X10   01/10/22 Nustep lv 5 6 minutes UE/LE Standing:   Heel raises 20X Mini squats 20X with 1 UE assist Alternating marches 3# 30X with 1 UE assist Hip abduction 3# 30X each 1 UE assist Hip extension 3# 30X each 1 UE assist   Forward lunges onto 6" step 1 UE assist 20X each   Forward step ups 6" 20X each with 1 UE assist   Lateral step ups 6" 20X each with 1 UE assist   Tandem stance no UE 3 x30" each LE lead (1 set on foam)    Vector stance with 1 UE  5X5" each LE  01/05/22 Nustep lev 3 5 minutes UE/LE Standing:  heelraises 20X  Mini squats 20X with 1 UE assist  Alternating marches 2# 20X with 1 UE assist  Hip abduction 2# 20X each 1 UE  assist  Hip extension 2# 20X each 1 UE assist  Forward lunges onto 6" step 1 UE assist 20X each  Forward step ups 6" 20X each with 1 UE assist  Lateral step ups 6" 20X each with 1 UE assist  Retro box taps 6" 20x ea with 1UE assist  Tandem stance no UE 30" each LE lead  Vector stance with 1 UE 5X5" each LE  Tandem walk forward in // bars x 3RT  Heel walk forward in // bars x 2RT x2 HHA  Toe walk forward in // bars x 2RT x2 HHA Sit to stand no UE 2X10 (UE's crossed)  12/26/21 Nustep lev 3 5 minutes UE/LE Standing:  heelraises 20X  Mini squats 20X with 1 UE assist  Alternating marches 20X with 1 UE assist  Hip abduction 20X each  Hip extension 20X each  Forward lunges onto 4" step 1 UE assist 20X each  Tandem stance no UE 30" each LE lead  Forward step ups 4" 20X each with 1 UE assist  Lateral step ups 4" 20X each with 1 UE assist  Vector stance with 1 UE 5X5" each LE Sit to stand no UE 2X10 (UE's crossed)   PATIENT EDUCATION: Education details: Findings, POC, energy conservation Person educated: Patient and Spouse Education method: Explanation Education comprehension: verbalized understanding   HOME EXERCISE PROGRAM: Access Code: [email or web address] URL: [email or web address]  12/21/21 - Mini Squat with Counter Support  - 1-2 x daily - 7 x weekly - 2 sets - 10 reps - Standing Hip Extension with Counter Support  - 1-2 x daily - 7 x weekly - 2 sets - 10 reps - Standing Tandem Balance with Counter Support  - 1-2 x daily - 7 x weekly - 1 sets - 3 reps - 20-30 second hold  Date: 12/12/2021 Prepared by: Kathlyn [place]  Exercises - Standing March with Counter Support  - 1-2 x daily - 7 x weekly - 1-2 sets - 5-10 reps - Standing Hip Abduction with Unilateral Counter Support  - 1-2 x daily - 7 x weekly - 1-2 sets - 5-10 reps - Heel Toe Raises with Unilateral Counter Support  - 1-2 x daily - 7 x weekly - 1-2 sets - 10 reps - Sit to Stand with Arms Crossed  - 1-2 x daily - 7 x  weekly - 1-2 sets - 5-10 reps - Standing Knee Flexion with Unilateral Counter Support  - 1-2 x daily - 7 x weekly - 1-2 sets - 5-10 reps    GOALS: Goals reviewed with patient? Yes  SHORT TERM GOALS: Target date: 01/09/22  Patient will be independent with initial HEP and self-management strategies to improve functional outcomes Baseline: Initiated  Goal status: IN PROGRESS    LONG TERM GOALS: Target date: 02/06/22  Patient will be independent with advanced HEP and self-management strategies to improve functional outcomes Baseline: none Goal status: IN PROGRESS  2.  Patient will improve LEFS score by 9 points to indicate improvement in functional outcomes. Baseline: 36/80 Goal status: IN PROGRESS  3.  Patient will improve 5 time sit to stand to <12 seconds to indicate improvement in functional abilities. Baseline: 19 seconds, no UE support Goal status: IN PROGRESS  4. Patient will have equal  to or > 4+/5 MMT throughout 5/5 BIL LEs to improve ability to perform functional mobility, stair ambulation and ADLs.  Baseline: See above Goal status: IN PROGRESS  5. Patient will be able to ambulate at least 350 feet during with LRAD to demonstrate improved ability to perform functional mobility and associated tasks. Baseline: 280 feet no AD Goal status: IN PROGRESS  ASSESSMENT:  CLINICAL IMPRESSION: Continued with LE strengthening with overall good activity tolerance.  Pt did require cues for improved form and posturing during therex and encouragement to reduce UE assist.  Balance on foam remains challenging with frequent UE taps to re-establish balance.  Began sidestepping with red theraband 60 feet with fatigue reported following. Pt will continue to benefit from skilled therapy services to reduce remaining deficits and improve functional ability.    OBJECTIVE IMPAIRMENTS Abnormal gait, decreased activity tolerance, decreased balance, decreased endurance, decreased knowledge of  condition, decreased mobility, difficulty walking, decreased strength, impaired sensation, impaired UE functional use, obesity, and pain.   ACTIVITY LIMITATIONS carrying, lifting, standing, squatting, stairs, and caring for others  PARTICIPATION LIMITATIONS: cleaning, laundry, driving, shopping, community activity, and yard work  PERSONAL FACTORS Time since onset of injury/illness/exacerbation are also affecting patient's functional outcome.   REHAB POTENTIAL: Excellent  CLINICAL DECISION MAKING: Stable/uncomplicated  EVALUATION COMPLEXITY: Low  PLAN: PT FREQUENCY: 1x/week  PT DURATION: 8 weeks  PLANNED INTERVENTIONS: Therapeutic exercises, Therapeutic activity, Neuromuscular re-education, Balance training, Gait training, Patient/Family education, Self Care, Joint mobilization, Stair training, Cryotherapy, Moist heat, Manual therapy, and Re-evaluation  PLAN FOR NEXT SESSION:  Gradual progression of endurance followed by strength; monitor for signs of neural fatigue. (GBS.)  Increase balance challenge.  4:03 PM, 01/24/22 Lurena Nida, PTA/CLT [facility] [place] Ph: [phone]

## 2022-01-25 DIAGNOSIS — Z91040 Latex allergy status: Secondary | ICD-10-CM | POA: Diagnosis not present

## 2022-01-25 DIAGNOSIS — Z79899 Other long term (current) drug therapy: Secondary | ICD-10-CM | POA: Diagnosis not present

## 2022-01-25 DIAGNOSIS — G608 Other hereditary and idiopathic neuropathies: Secondary | ICD-10-CM | POA: Diagnosis not present

## 2022-01-25 DIAGNOSIS — G610 Guillain-Barre syndrome: Secondary | ICD-10-CM | POA: Diagnosis not present

## 2022-01-25 DIAGNOSIS — Z888 Allergy status to other drugs, medicaments and biological substances status: Secondary | ICD-10-CM | POA: Diagnosis not present

## 2022-01-26 ENCOUNTER — Other Ambulatory Visit: Payer: Self-pay | Admitting: Family

## 2022-01-26 DIAGNOSIS — G629 Polyneuropathy, unspecified: Secondary | ICD-10-CM | POA: Diagnosis not present

## 2022-01-26 DIAGNOSIS — R6521 Severe sepsis with septic shock: Secondary | ICD-10-CM | POA: Diagnosis not present

## 2022-01-26 DIAGNOSIS — S82402A Unspecified fracture of shaft of left fibula, initial encounter for closed fracture: Secondary | ICD-10-CM | POA: Diagnosis not present

## 2022-01-26 DIAGNOSIS — M6281 Muscle weakness (generalized): Secondary | ICD-10-CM | POA: Diagnosis not present

## 2022-01-31 ENCOUNTER — Ambulatory Visit (HOSPITAL_COMMUNITY): Payer: Medicaid Other | Admitting: Physical Therapy

## 2022-01-31 DIAGNOSIS — R29898 Other symptoms and signs involving the musculoskeletal system: Secondary | ICD-10-CM | POA: Diagnosis not present

## 2022-01-31 DIAGNOSIS — R262 Difficulty in walking, not elsewhere classified: Secondary | ICD-10-CM

## 2022-01-31 NOTE — Therapy (Signed)
OUTPATIENT PHYSICAL THERAPY TREATMENT  Patient Name: Kristin Ballard MRN: [id number] DOB:[birth year], 36 y.o., female Today's Date: 01/31/2022  PHYSICAL THERAPY DISCHARGE SUMMARY  Visits from Start of Care: 7  Current functional level related to goals / functional outcomes: See below   Remaining deficits: See below   Education / Equipment: See assessment    Patient agrees to discharge. Patient goals were met. Patient is being discharged due to meeting the stated rehab goals.   PCP: Evelina Dun, FNP REFERRING PROVIDER: Chevis Pretty, FNP    PT End of Session - 01/31/22 1558     Visit Number 7    Number of Visits 8    Date for PT Re-Evaluation 02/06/22    Authorization Type [place] Medicaid [employer]    Authorization Time Period 10 approved 9/5-12/3/23    Authorization - Visit Number 6    Authorization - Number of Visits 10    Progress Note Due on Visit 8    PT Start Time 0762    PT Stop Time 1633    PT Time Calculation (min) 35 min    Activity Tolerance Patient tolerated treatment well    Behavior During Therapy [facility] for tasks assessed/performed             Past Medical History:  Diagnosis Date   Allergy    Functional ovarian cysts    Migraines    Past Surgical History:  Procedure Laterality Date   WISDOM TOOTH EXTRACTION  2011   Patient Active Problem List   Diagnosis Date Noted   Impaired mobility and ADLs 12/01/2021   Birth control counseling 10/04/2020   Depression, major, single episode, mild ([place]) 09/15/2019   Morbid obesity ([place]) 07/05/2017   GERD (gastroesophageal reflux disease) 07/05/2017   Current smoker 08/29/2016   Vitamin D deficiency 05/20/2014    ONSET DATE: March 2023  REFERRING DIAG: R29.898 (ICD-10-CM) - Weakness of both lower extremities   THERAPY DIAG:  Weakness of both lower extremities  Difficulty walking  Rationale for Evaluation and Treatment Rehabilitation  SUBJECTIVE:                                                                                                                                                                                               SUBJECTIVE STATEMENT: Pt states she's been a little more sore the last couple weeks as she's been walking down to the bus stop with her child and there's an incline.  Feet and legs still feel about the same with occasional back pain.    Pt accompanied by: significant other  PERTINENT HISTORY: Guillain Barre diagnosis 6/23  PAIN:  Are you having pain? Yes: NPRS scale: 07/14/08 Pain location: Bil legs/ feet Pain description: Stiffness Aggravating factors: none Relieving factors: none  PRECAUTIONS: None  WEIGHT BEARING RESTRICTIONS No  FALLS: Has patient fallen in last 6 months? No  LIVING ENVIRONMENT: Lives with: lives with their family and lives with their spouse Lives in: House/apartment Stairs: Yes: External: 4-5 steps; none Has following equipment at home: Environmental consultant - 2 wheeled, Crutches, and Wheelchair (manual)  PLOF: Independent  PATIENT GOALS Be able to walk longer distance, stop dropping things.  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   Disc bulging and facet hypertrophy combine for up to moderate neural foraminal stenosis at the left L3 and right L4 nerve levels. Mild left L4 foraminal stenosis.  IMPRESSION: Normal brain MRI.  No acute abnormality.  COGNITION: Overall cognitive status: Within functional limits for tasks assessed   SENSATION: Light touch: Impaired  impaired more so in feet and toes, fingertips - also hypersensitive in areas of toes.    DTRs:  Patella 1 = Trace     LOWER EXTREMITY ROM:     WNL  LOWER EXTREMITY MMT:    MMT Right Eval Left Eval Right  01/31/22 Left 01/31/22  Hip flexion 4- 4- 4+ 4+  Hip extension 4 4 4+  4+  Hip abduction 5 5 5 5   Hip adduction      Hip internal rotation      Hip external rotation      Knee flexion 4+ 4    Knee extension 5 4+ 5 4+  Ankle dorsiflexion 4- 4- 4+ 4+  Ankle plantarflexion      Ankle inversion      Ankle eversion      (Blank rows = not tested)  UPPER EXTREMITY GRIP STRENGTH: Hand Dynamometer left 19 kg, Rt 18 kg (she is right hand dominant)   TRANSFERS: Modified Independent    STAIRS:  Level of Assistance: Min A  Stair Negotiation Technique: Step to Pattern with No Rails (presses through husbands leg)  Number of Stairs: 5   Height of Stairs: 7"  Comments: requires assist at home  GAIT: Gait pattern: step through pattern, decreased stride length, decreased hip/knee flexion- Right, decreased hip/knee flexion- Left, decreased ankle dorsiflexion- Right, and decreased ankle dorsiflexion- Left Distance walked: 280 feet Assistive device utilized: None Level of assistance: Modified independence Comments: lacks full stride, reduced dorsiflexion BIL, no buckling noted  FUNCTIONAL TESTs:  5 times sit to stand: 8.5 sec (was 19 sec from w/c no UE support) 2 minute walk test: 480 feet (was 280 feet, no AD)  PATIENT SURVEYS:  LEFS 58/80 (was 36 / 80 = 45.0 %)  TODAY'S TREATMENT:  01/31/22 Octane stepper 5 min  LEFS 2 MWT MMT HEP review  Tandem stance >30 sec bilateral   01/24/22 200 feet ambulation warmup in hallway no AD Standing:  heelraises 20X  Squats 20X 1 UE assist  Alternating march 3# 30X each with 1 UE assist  Hip abduction 3# 30X with 1 UE assist  Hip extension 3# 30X with 1 UE assist  Vector stance with 1 UE assist 10X5" holds  Tandem stance on blue foam 2X30" each   Forward step ups 6" 20X each with 1 UE assist  Lateral step ups 6" 20X each with 1 UE assist  Side stepping  with RTB 60 feet Sit to stands no UE 3X10   01/10/22 Nustep lv 5 6 minutes UE/LE Standing:   Heel raises 20X Mini squats 20X with 1 UE assist Alternating  marches 3# 30X with 1 UE assist Hip abduction 3# 30X each 1 UE assist Hip extension 3# 30X each 1 UE assist   Forward lunges onto 6" step 1 UE assist 20X each   Forward step ups 6" 20X each with 1 UE assist   Lateral step ups 6" 20X each with 1 UE assist   Tandem stance no UE 3 x30" each LE lead (1 set on foam)    Vector stance with 1 UE 5X5" each LE   PATIENT EDUCATION: Education details: Findings, POC, energy conservation Person educated: Patient and Spouse Education method: Explanation Education comprehension: verbalized understanding   HOME EXERCISE PROGRAM: Access Code: [email or web address] URL: [email or web address] 01/31/22 - Hip Abduction with Resistance Loop  - 1 x daily - 4-5 x weekly - 2 sets - 10 reps - Hip Extension with Resistance Loop  - 1 x daily - 4-5 x weekly - 2 sets - 10 reps  12/21/21 - Mini Squat with Counter Support  - 1-2 x daily - 7 x weekly - 2 sets - 10 reps - Standing Hip Extension with Counter Support  - 1-2 x daily - 7 x weekly - 2 sets - 10 reps - Standing Tandem Balance with Counter Support  - 1-2 x daily - 7 x weekly - 1 sets - 3 reps - 20-30 second hold  Date: 12/12/2021 Prepared by: Candie Mile  Exercises - Standing March with Counter Support  - 1-2 x daily - 7 x weekly - 1-2 sets - 5-10 reps - Standing Hip Abduction with Unilateral Counter Support  - 1-2 x daily - 7 x weekly - 1-2 sets - 5-10 reps - Heel Toe Raises with Unilateral Counter Support  - 1-2 x daily - 7 x weekly - 1-2 sets - 10 reps - Sit to Stand with Arms Crossed  - 1-2 x daily - 7 x weekly - 1-2 sets - 5-10 reps - Standing Knee Flexion with Unilateral Counter Support  - 1-2 x daily - 7 x weekly - 1-2 sets - 5-10 reps    GOALS: Goals reviewed with patient? Yes  SHORT TERM GOALS: Target date: 01/09/22  Patient will be independent with initial HEP and self-management strategies to improve functional outcomes Baseline: Initiated  Goal status: MET   LONG TERM GOALS:  Target date: 02/06/22  Patient will be independent with advanced HEP and self-management strategies to improve functional outcomes Baseline: none Goal status: MET  2.  Patient will improve LEFS score by 9 points to indicate improvement in functional outcomes. Baseline: 58/80 Goal status: MET  3.  Patient will improve 5 time sit to stand to <12 seconds to indicate improvement in functional abilities. Baseline: 8.58 sec no UE support Goal status: MET  4. Patient will have equal to or > 4+/5 MMT throughout BIL LEs to improve ability to perform functional mobility, stair ambulation and ADLs.  Baseline: See MMT Goal status: MET  5. Patient will be able to ambulate at least 350 feet during 2MWT with LRAD to demonstrate improved ability to perform functional mobility and associated tasks. Baseline: 480 feet no AD Goal status: MET  ASSESSMENT:  CLINICAL IMPRESSION:  Patient has made very good progress to therapy goals. Currently with all goals met. Significant improvement in strength and functional mobility as well as  balance. Reviewed HEP and answered all patient questions. Patient DC with all goals MET today. Encouraged patient to follow up with any further questions or concerns.   OBJECTIVE IMPAIRMENTS Abnormal gait, decreased activity tolerance, decreased balance, decreased endurance, decreased knowledge of condition, decreased mobility, difficulty walking, decreased strength, impaired sensation, impaired UE functional use, obesity, and pain.   ACTIVITY LIMITATIONS carrying, lifting, standing, squatting, stairs, and caring for others  PARTICIPATION LIMITATIONS: cleaning, laundry, driving, shopping, community activity, and yard work  PERSONAL FACTORS Time since onset of injury/illness/exacerbation are also affecting patient's functional outcome.   REHAB POTENTIAL: Excellent  CLINICAL DECISION MAKING: Stable/uncomplicated  EVALUATION COMPLEXITY: Low  PLAN: PT FREQUENCY:  1x/week  PT DURATION: 8 weeks  PLANNED INTERVENTIONS: Therapeutic exercises, Therapeutic activity, Neuromuscular re-education, Balance training, Gait training, Patient/Family education, Self Care, Joint mobilization, Stair training, Cryotherapy, Moist heat, Manual therapy, and Re-evaluation  PLAN FOR NEXT SESSION:  DC to HEP  4:41 PM, 01/31/22 Josue Hector PT DPT  Physical Therapist with [facility]  [phone]

## 2022-02-01 ENCOUNTER — Other Ambulatory Visit: Payer: Self-pay | Admitting: Obstetrics and Gynecology

## 2022-02-01 NOTE — Patient Outreach (Signed)
Care Coordination  02/01/2022  Kristin Ballard 1985/07/20 [id number]   Medicaid Managed Care   Unsuccessful Outreach Note  02/01/2022 Name: Kristin Ballard MRN: [id number] DOB: 06/10/1985  Referred by: Sharion Balloon, FNP Reason for referral : High Risk Managed Medicaid (Unsuccessful telephone outreach)   An unsuccessful telephone outreach was attempted today. The patient was referred to the case management team for assistance with care management and care coordination.   Follow Up Plan: The care management team will reach out to the patient again over the next 30 business  days.   Aida Raider RN, BSN [place]  [employer] - Managed Medicaid High Risk [phone]

## 2022-02-01 NOTE — Patient Instructions (Signed)
Hi Kristin Ballard, sorry I have missed you today, I hope all is well - as a part of your Medicaid benefit, you are eligible for care management and care coordination services at no cost or copay. I was unable to reach you by phone today but would be happy to help you with your health related needs. Please feel free to call me at [phone].  A member of the Managed Medicaid care management team will reach out to you again over the next 30 business days.   Aida Raider RN, BSN [place]  [employer]ator - Managed Medicaid High Risk [phone].

## 2022-02-06 ENCOUNTER — Encounter (HOSPITAL_COMMUNITY): Payer: Medicaid Other | Admitting: Physical Therapy

## 2022-02-14 ENCOUNTER — Encounter (HOSPITAL_COMMUNITY): Payer: Medicaid Other | Admitting: Physical Therapy

## 2022-02-18 ENCOUNTER — Other Ambulatory Visit: Payer: Self-pay | Admitting: Family

## 2022-02-19 ENCOUNTER — Ambulatory Visit (INDEPENDENT_AMBULATORY_CARE_PROVIDER_SITE_OTHER): Payer: Medicaid Other

## 2022-02-19 DIAGNOSIS — E538 Deficiency of other specified B group vitamins: Secondary | ICD-10-CM | POA: Diagnosis not present

## 2022-02-19 MED ORDER — CYANOCOBALAMIN 1000 MCG/ML IJ SOLN
1000.0000 ug | Freq: Once | INTRAMUSCULAR | Status: AC
  Administered 2022-02-19: 1000 ug via INTRAMUSCULAR

## 2022-02-19 NOTE — Progress Notes (Signed)
Cyanocobalamin injection given to right deltoid.  Patient tolerated well. 

## 2022-02-20 ENCOUNTER — Encounter (HOSPITAL_COMMUNITY): Payer: Medicaid Other | Admitting: Physical Therapy

## 2022-02-26 DIAGNOSIS — S82402A Unspecified fracture of shaft of left fibula, initial encounter for closed fracture: Secondary | ICD-10-CM | POA: Diagnosis not present

## 2022-02-26 DIAGNOSIS — M6281 Muscle weakness (generalized): Secondary | ICD-10-CM | POA: Diagnosis not present

## 2022-02-26 DIAGNOSIS — R6521 Severe sepsis with septic shock: Secondary | ICD-10-CM | POA: Diagnosis not present

## 2022-02-26 DIAGNOSIS — G629 Polyneuropathy, unspecified: Secondary | ICD-10-CM | POA: Diagnosis not present

## 2022-03-05 ENCOUNTER — Encounter: Payer: Self-pay | Admitting: Family

## 2022-03-05 ENCOUNTER — Ambulatory Visit (INDEPENDENT_AMBULATORY_CARE_PROVIDER_SITE_OTHER): Payer: Medicaid Other | Admitting: Family

## 2022-03-05 VITALS — BP 136/86 | HR 62 | Temp 98.0°F | Ht 63.0 in | Wt 256.0 lb

## 2022-03-05 DIAGNOSIS — K5900 Constipation, unspecified: Secondary | ICD-10-CM | POA: Insufficient documentation

## 2022-03-05 DIAGNOSIS — K219 Gastro-esophageal reflux disease without esophagitis: Secondary | ICD-10-CM | POA: Diagnosis not present

## 2022-03-05 DIAGNOSIS — E538 Deficiency of other specified B group vitamins: Secondary | ICD-10-CM

## 2022-03-05 DIAGNOSIS — F17200 Nicotine dependence, unspecified, uncomplicated: Secondary | ICD-10-CM

## 2022-03-05 DIAGNOSIS — E559 Vitamin D deficiency, unspecified: Secondary | ICD-10-CM | POA: Diagnosis not present

## 2022-03-05 DIAGNOSIS — G610 Guillain-Barre syndrome: Secondary | ICD-10-CM

## 2022-03-05 DIAGNOSIS — D509 Iron deficiency anemia, unspecified: Secondary | ICD-10-CM

## 2022-03-05 DIAGNOSIS — F320 Major depressive disorder, single episode, mild: Secondary | ICD-10-CM

## 2022-03-05 DIAGNOSIS — Z3009 Encounter for other general counseling and advice on contraception: Secondary | ICD-10-CM

## 2022-03-05 MED ORDER — GABAPENTIN 300 MG PO CAPS: 300.0000 mg | ORAL_CAPSULE | Freq: Three times a day (TID) | ORAL | 3 refills | Status: DC

## 2022-03-05 MED ORDER — BACLOFEN 10 MG PO TABS: ORAL_TABLET | ORAL | 2 refills | Status: DC

## 2022-03-05 MED ORDER — DULOXETINE HCL 30 MG PO CPEP: 30.0000 mg | ORAL_CAPSULE | Freq: Every day | ORAL | 1 refills | Status: DC

## 2022-03-05 MED ORDER — OMEPRAZOLE 20 MG PO CPDR: 20.0000 mg | DELAYED_RELEASE_CAPSULE | Freq: Every day | ORAL | 1 refills | Status: DC

## 2022-03-05 NOTE — Progress Notes (Signed)
 Subjective:    Patient ID: Kristin Ballard, female    DOB: 06/08/1985, 36 y.o.   MRN: [id number]  Chief Complaint  Patient presents with   Medical Management of Chronic Issues   Pt presents to the office today for chronic follow up  She is followed by  Neurologists every 6 months for guillain-barre syndrome. She completed PT.    She was taking OC, but has been out 2-3 months.  Last menstrual cycle was June 18-July 7.  Gastroesophageal Reflux She complains of belching and heartburn. This is a chronic problem. The current episode started more than 1 year ago. The problem occurs occasionally. The symptoms are aggravated by certain foods and smoking. Pertinent negatives include no fatigue or weight loss. Risk factors include obesity. She has tried a PPI for the symptoms. The treatment provided moderate relief.  Anemia Presents for follow-up visit. There has been no malaise/fatigue or weight loss.  Depression        This is a chronic problem.  The current episode started more than 1 year ago.   Associated symptoms include no fatigue, no helplessness, no hopelessness and not sad.  Past treatments include SNRIs - Serotonin and norepinephrine reuptake inhibitors.  Compliance with treatment is good. Nicotine Dependence Presents for follow-up visit. Symptoms are negative for fatigue. Her urge triggers include company of smokers. The symptoms have been stable. She smokes < 1/2 a pack of cigarettes per day.  Constipation This is a chronic problem. The current episode started more than 1 year ago. The problem has been waxing and waning since onset. Pertinent negatives include no weight loss. She has tried stool softeners for the symptoms. The treatment provided moderate relief.      Review of Systems  Constitutional:  Negative for fatigue, malaise/fatigue and weight loss.  Gastrointestinal:  Positive for constipation and heartburn.  Psychiatric/Behavioral:  Positive for depression.   All other  systems reviewed and are negative.      Objective:   Physical Exam Vitals reviewed.  Constitutional:      General: She is not in acute distress.    Appearance: She is well-developed. She is obese.  HENT:     Head: Normocephalic and atraumatic.     Right Ear: Tympanic membrane normal.     Left Ear: Tympanic membrane normal.  Eyes:     Pupils: Pupils are equal, round, and reactive to light.  Neck:     Thyroid: No thyromegaly.  Cardiovascular:     Rate and Rhythm: Normal rate and regular rhythm.     Heart sounds: Normal heart sounds. No murmur heard. Pulmonary:     Effort: Pulmonary effort is normal. No respiratory distress.     Breath sounds: Normal breath sounds. No wheezing.  Abdominal:     General: Bowel sounds are normal. There is no distension.     Palpations: Abdomen is soft.     Tenderness: There is no abdominal tenderness.  Musculoskeletal:        General: No tenderness. Normal range of motion.     Cervical back: Normal range of motion and neck supple.  Skin:    General: Skin is warm and dry.  Neurological:     Mental Status: She is alert and oriented to person, place, and time.     Cranial Nerves: No cranial nerve deficit.     Deep Tendon Reflexes: Reflexes are normal and symmetric.  Psychiatric:        Behavior: Behavior normal.          Thought Content: Thought content normal.        Judgment: Judgment normal.       BP 136/86   Pulse 62   Temp 98 F (36.7 C) (Temporal)   Ht 5' 3" (1.6 m)   Wt 256 lb (116.1 kg)   SpO2 97%   BMI 45.35 kg/m      Assessment & Plan:  SHAUNESSY DOBRATZ comes in today with chief complaint of Medical Management of Chronic Issues   Diagnosis and orders addressed:  1. Depression, major, single episode, mild (HCC) - DULoxetine (CYMBALTA) 30 MG capsule; Take 1 capsule (30 mg total) by mouth daily.  Dispense: 90 capsule; Refill: 1 - CMP14+EGFR  2. Morbid obesity ([place]) - CMP14+EGFR  3. Gastroesophageal reflux disease,  unspecified whether esophagitis present - omeprazole (PRILOSEC) 20 MG capsule; Take 1 capsule (20 mg total) by mouth daily.  Dispense: 90 capsule; Refill: 1 - CMP14+EGFR  4. Vitamin D deficiency - CMP14+EGFR  5. Current smoker - CMP14+EGFR  6. Birth control counseling - CMP14+EGFR  7. Vitamin B 12 deficiency - Anemia Profile B - CMP14+EGFR  8. Constipation, unspecified constipation type - CMP14+EGFR  9. Iron deficiency anemia, unspecified iron deficiency anemia type  - Anemia Profile B - CMP14+EGFR  10. Guillain Barr syndrome [facility])    Labs pending Health Maintenance reviewed Diet and exercise encouraged  Follow up plan: 6 months    Evelina Dun, FNP

## 2022-03-05 NOTE — Patient Instructions (Signed)
Vitamin B12 Deficiency Vitamin B12 deficiency occurs when the body does not have enough of this important vitamin. The body needs this vitamin: To make red blood cells. To make DNA. This is the genetic material inside cells. To help the nerves work properly so they can carry messages from the brain to the body. Vitamin B12 deficiency can cause health problems, such as not having enough red blood cells in the blood (anemia). This can lead to nerve damage if untreated. What are the causes? This condition may be caused by: Not eating enough foods that contain vitamin B12. Not having enough stomach acid and digestive fluids to properly absorb vitamin B12 from the food that you eat. Having certain diseases that make it hard to absorb vitamin B12. These diseases include Crohn's disease, chronic pancreatitis, and cystic fibrosis. An autoimmune disorder in which the body does not make enough of a protein (intrinsic factor) within the stomach, resulting in not enough absorption of vitamin B12. Having a surgery in which part of the stomach or small intestine is removed. Taking certain medicines that make it hard for the body to absorb vitamin B12. These include: Heartburn medicines, such as antacids and proton pump inhibitors. Some medicines that are used to treat diabetes. What increases the risk? The following factors may make you more likely to develop a vitamin B12 deficiency: Being an older adult. Eating a vegetarian or vegan diet that does not include any foods that come from animals. Eating a poor diet while you are pregnant. Taking certain medicines. Having alcoholism. What are the signs or symptoms? In some cases, there are no symptoms of this condition. If the condition leads to anemia or nerve damage, various symptoms may occur, such as: Weakness. Tiredness (fatigue). Loss of appetite. Numbness or tingling in your hands and feet. Redness and burning of the tongue. Depression,  confusion, or memory problems. Trouble walking. If anemia is severe, symptoms can include: Shortness of breath. Dizziness. Rapid heart rate. How is this diagnosed? This condition may be diagnosed with a blood test to measure the level of vitamin B12 in your blood. You may also have other tests, including: A group of tests that measure certain characteristics of blood cells (complete blood count, CBC). A blood test to measure intrinsic factor. A procedure where a thin tube with a camera on the end is used to look into your stomach or intestines (endoscopy). Other tests may be needed to discover the cause of the deficiency. How is this treated? Treatment for this condition depends on the cause. This condition may be treated by: Changing your eating and drinking habits, such as: Eating more foods that contain vitamin B12. Drinking less alcohol or no alcohol. Getting vitamin B12 injections. Taking vitamin B12 supplements by mouth (orally). Your health care provider will tell you which dose is best for you. Follow these instructions at home: Eating and drinking  Include foods in your diet that come from animals and contain a lot of vitamin B12. These include: Meats and poultry. This includes beef, pork, chicken, turkey, and organ meats, such as liver. Seafood. This includes clams, rainbow trout, salmon, tuna, and haddock. Eggs. Dairy foods such as milk, yogurt, and cheese. Eat foods that have vitamin B12 added to them (are fortified), such as ready-to-eat breakfast cereals. Check the label on the package to see if a food is fortified. The items listed above may not be a complete list of foods and beverages you can eat and drink. Contact a dietitian for   more information. Alcohol use Do not drink alcohol if: Your health care provider tells you not to drink. You are pregnant, may be pregnant, or are planning to become pregnant. If you drink alcohol: Limit how much you have to: 0-1 drink a  day for women. 0-2 drinks a day for men. Know how much alcohol is in your drink. In the [place] one drink equals one 12 oz bottle of beer (355 mL), one 5 oz glass of wine (148 mL), or one 1 oz glass of hard liquor (44 mL). General instructions Get vitamin B12 injections if told to by your health care provider. Take supplements only as told by your health care provider. Follow the directions carefully. Keep all follow-up visits. This is important. Contact a health care provider if: Your symptoms come back. Your symptoms get worse or do not improve with treatment. Get help right away: You develop shortness of breath. You have a rapid heart rate. You have chest pain. You become dizzy or you faint. These symptoms may be an emergency. Get help right away. Call 911. Do not wait to see if the symptoms will go away. Do not drive yourself to the hospital. Summary Vitamin B12 deficiency occurs when the body does not have enough of this important vitamin. Common causes include not eating enough foods that contain vitamin B12, not being able to absorb vitamin B12 from the food that you eat, having a surgery in which part of the stomach or small intestine is removed, or taking certain medicines. Eat foods that have vitamin B12 in them. Treatment may include making a change in the way you eat and drink, getting vitamin B12 injections, or taking vitamin B12 supplements. This information is not intended to replace advice given to you by your health care provider. Make sure you discuss any questions you have with your health care provider. Document Revised: 11/18/2020 Document Reviewed: 11/18/2020 Elsevier Patient Education  2023 Elsevier Inc.  

## 2022-03-06 ENCOUNTER — Other Ambulatory Visit: Payer: Medicaid Other | Admitting: Obstetrics and Gynecology

## 2022-03-06 ENCOUNTER — Other Ambulatory Visit: Payer: Self-pay | Admitting: Family Medicine

## 2022-03-06 DIAGNOSIS — R748 Abnormal levels of other serum enzymes: Secondary | ICD-10-CM

## 2022-03-06 LAB — ANEMIA PROFILE B
Basophils Absolute: 0 10*3/uL (ref 0.0–0.2)
Basos: 1 %
EOS (ABSOLUTE): 0 10*3/uL (ref 0.0–0.4)
Eos: 1 %
Ferritin: 54 ng/mL (ref 15–150)
Folate: 7.6 ng/mL (ref >3.0)
Hematocrit: 35.8 % (ref 34.0–46.6)
Hemoglobin: 11.8 g/dL (ref 11.1–15.9)
Immature Grans (Abs): 0 10*3/uL (ref 0.0–0.1)
Immature Granulocytes: 0 %
Iron Saturation: 55 % (ref 15–55)
Iron: 208 ug/dL — ABNORMAL HIGH (ref 27–159)
Lymphocytes Absolute: 1.5 10*3/uL (ref 0.7–3.1)
Lymphs: 42 %
MCH: 30.1 pg (ref 26.6–33.0)
MCHC: 33 g/dL (ref 31.5–35.7)
MCV: 91 fL (ref 79–97)
Monocytes Absolute: 0.2 10*3/uL (ref 0.1–0.9)
Monocytes: 5 %
Neutrophils Absolute: 1.8 10*3/uL (ref 1.4–7.0)
Neutrophils: 51 %
Platelets: 260 10*3/uL (ref 150–450)
RBC: 3.92 x10E6/uL (ref 3.77–5.28)
RDW: 14.2 % (ref 11.7–15.4)
Retic Ct Pct: 1.5 % (ref 0.6–2.6)
Total Iron Binding Capacity: 377 ug/dL (ref 250–450)
UIBC: 169 ug/dL (ref 131–425)
Vitamin B-12: 388 pg/mL (ref 232–1245)
WBC: 3.6 10*3/uL (ref 3.4–10.8)

## 2022-03-06 LAB — CMP14+EGFR
ALT: 48 IU/L — ABNORMAL HIGH (ref 0–32)
AST: 101 IU/L — ABNORMAL HIGH (ref 0–40)
Albumin/Globulin Ratio: 1.7 (ref 1.2–2.2)
Albumin: 4 g/dL (ref 3.9–4.9)
Alkaline Phosphatase: 74 IU/L (ref 44–121)
BUN/Creatinine Ratio: 12 (ref 9–23)
BUN: 10 mg/dL (ref 6–20)
Bilirubin Total: 0.2 mg/dL (ref 0.0–1.2)
CO2: 22 mmol/L (ref 20–29)
Calcium: 8.7 mg/dL (ref 8.7–10.2)
Chloride: 104 mmol/L (ref 96–106)
Creatinine, Ser: 0.82 mg/dL (ref 0.57–1.00)
Globulin, Total: 2.4 g/dL (ref 1.5–4.5)
Glucose: 111 mg/dL — ABNORMAL HIGH (ref 70–99)
Potassium: 4.1 mmol/L (ref 3.5–5.2)
Sodium: 142 mmol/L (ref 134–144)
Total Protein: 6.4 g/dL (ref 6.0–8.5)
eGFR: 94 mL/min/{1.73_m2} (ref >59)

## 2022-03-06 NOTE — Patient Instructions (Signed)
Hi Ms. Riches, sorry to have missed you today, I hope you are doing okay - as a part of your Medicaid benefit, you are eligible for care management and care coordination services at no cost or copay. I was unable to reach you by phone today but would be happy to help you with your health related needs. Please feel free to call me at [phone].  A member of the Managed Medicaid care management team will reach out to you again over the next 30 business  days.   Kathi Der RN, BSN [place]  [employer] Engineer, production - Managed Medicaid High Risk [phone]

## 2022-03-06 NOTE — Patient Outreach (Signed)
  Medicaid Managed Care   Unsuccessful Attempt Note   03/06/2022 Name: Kristin Ballard MRN: [id number] DOB: November 25, 1985  Referred by: Junie Spencer, FNP Reason for referral : High Risk Managed Medicaid (Unsuccessful telephone outreach)  A second unsuccessful telephone outreach was attempted today. The patient was referred to the case management team for assistance with care management and care coordination.    Follow Up Plan: The Managed Medicaid care management team will reach out to the patient again over the next 30 business  days. and The  Patient has been provided with contact information for the Managed Medicaid care management team and has been advised to call with any health related questions or concerns.   Kathi Der RN, BSN [place]  [employer] Engineer, production - Managed Medicaid High Risk [phone]

## 2022-03-08 ENCOUNTER — Telehealth: Payer: Self-pay

## 2022-03-08 NOTE — Telephone Encounter (Signed)
..   Medicaid Managed Care Note  03/08/2022 Name: Kristin Ballard MRN: [id number] DOB: Aug 25, 1985  Kristin Ballard is a 36 y.o. year old female who is a primary care patient of Junie Spencer, FNP and is actively engaged with the care management team. I reached out to Kristin Ballard by phone today to assist with re-scheduling a follow up visit with the RN Case Manager  Follow up plan: Unsuccessful telephone outreach attempt made. A HIPAA compliant phone message was left for the patient providing contact information and requesting a return call.  The care management team will reach out to the patient again over the next 7 days.    Weston Settle Care Guide, High Risk Medicaid Managed Care Embedded Care Coordination [facility]  [facility]

## 2022-03-09 ENCOUNTER — Other Ambulatory Visit: Payer: Medicaid Other

## 2022-03-09 DIAGNOSIS — R748 Abnormal levels of other serum enzymes: Secondary | ICD-10-CM | POA: Diagnosis not present

## 2022-03-10 LAB — CMP14+EGFR
ALT: 13 IU/L (ref 0–32)
AST: 18 IU/L (ref 0–40)
Albumin/Globulin Ratio: 1.7 (ref 1.2–2.2)
Albumin: 4 g/dL (ref 3.9–4.9)
Alkaline Phosphatase: 72 IU/L (ref 44–121)
BUN/Creatinine Ratio: 15 (ref 9–23)
BUN: 15 mg/dL (ref 6–20)
Bilirubin Total: <0.2 mg/dL (ref 0.0–1.2)
CO2: 23 mmol/L (ref 20–29)
Calcium: 8.8 mg/dL (ref 8.7–10.2)
Chloride: 102 mmol/L (ref 96–106)
Creatinine, Ser: 0.97 mg/dL (ref 0.57–1.00)
Globulin, Total: 2.4 g/dL (ref 1.5–4.5)
Glucose: 91 mg/dL (ref 70–99)
Potassium: 4.6 mmol/L (ref 3.5–5.2)
Sodium: 141 mmol/L (ref 134–144)
Total Protein: 6.4 g/dL (ref 6.0–8.5)
eGFR: 77 mL/min/{1.73_m2} (ref >59)

## 2022-03-15 ENCOUNTER — Telehealth: Payer: Self-pay

## 2022-03-15 NOTE — Telephone Encounter (Signed)
..   Medicaid Managed Care Note  03/15/2022 Name: Kristin Ballard MRN: [id number] DOB: 28-Sep-1985  Kristin Ballard is a 36 y.o. year old female who is a primary care patient of Junie Spencer, FNP and is actively engaged with the care management team. I reached out to Kristin Ballard by phone today to assist with re-scheduling a follow up visit with the RN Case Manager  Follow up plan: Unsuccessful telephone outreach attempt made. A HIPAA compliant phone message was left for the patient providing contact information and requesting a return call.  We have been unable to make contact with the patient for follow up. The care management team is available to follow up with the patient after provider conversation with the patient regarding recommendation for care management engagement and subsequent re-referral to the care management team.   Weston Settle Care Guide, High Risk Medicaid Managed Care Embedded Care Coordination [facility]  [facility]

## 2022-03-22 ENCOUNTER — Ambulatory Visit: Payer: Medicaid Other | Admitting: *Deleted

## 2022-03-22 DIAGNOSIS — E538 Deficiency of other specified B group vitamins: Secondary | ICD-10-CM

## 2022-03-22 MED ORDER — CYANOCOBALAMIN 1000 MCG/ML IJ SOLN
1000.0000 ug | Freq: Once | INTRAMUSCULAR | Status: AC
  Administered 2022-03-22: 1000 ug via INTRAMUSCULAR

## 2022-03-26 ENCOUNTER — Other Ambulatory Visit: Payer: Self-pay | Admitting: Family

## 2022-03-28 DIAGNOSIS — G629 Polyneuropathy, unspecified: Secondary | ICD-10-CM | POA: Diagnosis not present

## 2022-03-28 DIAGNOSIS — R6521 Severe sepsis with septic shock: Secondary | ICD-10-CM | POA: Diagnosis not present

## 2022-03-28 DIAGNOSIS — S82402A Unspecified fracture of shaft of left fibula, initial encounter for closed fracture: Secondary | ICD-10-CM | POA: Diagnosis not present

## 2022-03-28 DIAGNOSIS — M6281 Muscle weakness (generalized): Secondary | ICD-10-CM | POA: Diagnosis not present

## 2022-04-08 ENCOUNTER — Other Ambulatory Visit: Payer: Self-pay | Admitting: Family

## 2022-04-16 ENCOUNTER — Other Ambulatory Visit: Payer: Medicaid Other | Admitting: Obstetrics and Gynecology

## 2022-04-16 NOTE — Patient Outreach (Cosign Needed)
Care Coordination  04/16/2022  [facility] [birth year] [id number]   Medicaid Managed Care   Unsuccessful Outreach Note  04/16/2022 Name: CHERYLANN HOBDAY MRN: [id number] DOB: 1985-08-27  Referred by: Sharion Balloon, FNP Reason for referral : High Risk Managed Medicaid (Unsuccessful telephone outreach)   Fourth  unsuccessful telephone outreach was attempted. The patient was referred to the case management team for assistance with care management and care coordination. The patient's primary care provider has been notified of our unsuccessful attempts to make or maintain contact with the patient. The care management team is pleased to engage with this patient at any time in the future should he/she be interested in assistance from the care management team.   Follow Up Plan: We have been unable to make contact with the patient for follow up. The care management team is available to follow up with the patient after provider conversation with the patient regarding recommendation for care management engagement and subsequent re-referral to the care management team.   Aida Raider RN, BSN [place] Management Coordinator - Managed [place] High Risk [phone]

## 2022-04-16 NOTE — Patient Instructions (Signed)
Hi Ms. Alleman - as a part of your Medicaid benefit, you are eligible for care management and care coordination services at no cost or copay. IWe have been unable to reach you by phone  but would be happy to help you with your health related needs. Please feel free to call me at [phone].  Aida Raider RN, BSN [place]  [facility] - Managed Medicaid High Risk [phone]

## 2022-04-18 ENCOUNTER — Ambulatory Visit: Payer: Medicaid Other | Admitting: Obstetrics and Gynecology

## 2022-04-22 ENCOUNTER — Other Ambulatory Visit: Payer: Self-pay | Admitting: Family

## 2022-04-22 DIAGNOSIS — Z3009 Encounter for other general counseling and advice on contraception: Secondary | ICD-10-CM

## 2022-04-23 ENCOUNTER — Ambulatory Visit: Payer: Medicaid Other

## 2022-04-23 DIAGNOSIS — E538 Deficiency of other specified B group vitamins: Secondary | ICD-10-CM

## 2022-04-23 NOTE — Progress Notes (Signed)
Cyanocobalamin injection given to left deltoid.  Patient tolerated well. 

## 2022-05-24 ENCOUNTER — Ambulatory Visit: Payer: Medicaid Other

## 2022-05-30 ENCOUNTER — Ambulatory Visit (INDEPENDENT_AMBULATORY_CARE_PROVIDER_SITE_OTHER): Payer: Medicaid Other

## 2022-05-30 DIAGNOSIS — E538 Deficiency of other specified B group vitamins: Secondary | ICD-10-CM

## 2022-05-30 MED ORDER — CYANOCOBALAMIN 1000 MCG/ML IJ SOLN
1000.0000 ug | INTRAMUSCULAR | Status: AC
  Administered 2022-05-30 – 2023-03-18 (×10): 1000 ug via INTRAMUSCULAR

## 2022-05-30 NOTE — Progress Notes (Signed)
Cyanocobalamin injection given to right deltoid.  Patient tolerated well. 

## 2022-06-29 ENCOUNTER — Ambulatory Visit (INDEPENDENT_AMBULATORY_CARE_PROVIDER_SITE_OTHER): Payer: Medicaid Other

## 2022-06-29 DIAGNOSIS — E538 Deficiency of other specified B group vitamins: Secondary | ICD-10-CM

## 2022-06-29 NOTE — Progress Notes (Signed)
Cyanocobalamin injection given to left deltoid.  Patient tolerated well. 

## 2022-07-30 ENCOUNTER — Ambulatory Visit (INDEPENDENT_AMBULATORY_CARE_PROVIDER_SITE_OTHER): Payer: Medicaid Other

## 2022-07-30 DIAGNOSIS — E538 Deficiency of other specified B group vitamins: Secondary | ICD-10-CM

## 2022-07-30 NOTE — Progress Notes (Signed)
Cyanocobalamin injection given to right deltoid.  Patient tolerated well. 

## 2022-08-30 ENCOUNTER — Other Ambulatory Visit: Payer: Self-pay | Admitting: Family

## 2022-08-30 ENCOUNTER — Ambulatory Visit (INDEPENDENT_AMBULATORY_CARE_PROVIDER_SITE_OTHER): Payer: Medicaid Other

## 2022-08-30 DIAGNOSIS — E538 Deficiency of other specified B group vitamins: Secondary | ICD-10-CM

## 2022-09-04 ENCOUNTER — Ambulatory Visit (INDEPENDENT_AMBULATORY_CARE_PROVIDER_SITE_OTHER): Payer: Medicaid Other | Admitting: Family

## 2022-09-04 ENCOUNTER — Encounter: Payer: Self-pay | Admitting: Family

## 2022-09-04 VITALS — BP 140/87 | HR 84 | Temp 97.3°F | Ht 63.0 in | Wt 285.6 lb

## 2022-09-04 DIAGNOSIS — Z3009 Encounter for other general counseling and advice on contraception: Secondary | ICD-10-CM

## 2022-09-04 DIAGNOSIS — E538 Deficiency of other specified B group vitamins: Secondary | ICD-10-CM

## 2022-09-04 DIAGNOSIS — K219 Gastro-esophageal reflux disease without esophagitis: Secondary | ICD-10-CM

## 2022-09-04 DIAGNOSIS — F320 Major depressive disorder, single episode, mild: Secondary | ICD-10-CM

## 2022-09-04 DIAGNOSIS — F17200 Nicotine dependence, unspecified, uncomplicated: Secondary | ICD-10-CM | POA: Diagnosis not present

## 2022-09-04 DIAGNOSIS — G610 Guillain-Barre syndrome: Secondary | ICD-10-CM | POA: Diagnosis not present

## 2022-09-04 DIAGNOSIS — E559 Vitamin D deficiency, unspecified: Secondary | ICD-10-CM

## 2022-09-04 DIAGNOSIS — D509 Iron deficiency anemia, unspecified: Secondary | ICD-10-CM

## 2022-09-04 DIAGNOSIS — K5900 Constipation, unspecified: Secondary | ICD-10-CM

## 2022-09-04 MED ORDER — DULOXETINE HCL 60 MG PO CPEP
60.0000 mg | ORAL_CAPSULE | Freq: Every day | ORAL | 1 refills | Status: DC
Start: 2022-09-04 — End: 2023-02-06

## 2022-09-04 NOTE — Patient Instructions (Signed)
Peripheral Neuropathy Peripheral neuropathy is a type of nerve damage. It affects nerves that carry signals between the spinal cord and the arms, legs, and the rest of the body (peripheral nerves). It does not affect nerves in the spinal cord or brain. In peripheral neuropathy, one nerve or a group of nerves may be damaged. Peripheral neuropathy is a broad category that includes many specific nerve disorders, like diabetic neuropathy, hereditary neuropathy, and carpal tunnel syndrome. What are the causes? This condition may be caused by: Certain diseases, such as: Diabetes. This is the most common cause of peripheral neuropathy. Autoimmune diseases, such as rheumatoid arthritis and systemic lupus erythematosus. Nerve diseases that are passed from parent to child (inherited). Kidney disease. Thyroid disease. Other causes may include: Nerve injury. Pressure or stress on a nerve that lasts a long time. Lack (deficiency) of B vitamins. This can result from alcoholism, poor diet, or a restricted diet. Infections. Some medicines, such as cancer medicines (chemotherapy). Poisonous (toxic) substances, such as lead and mercury. Too little blood flowing to the legs. In some cases, the cause of this condition is not known. What are the signs or symptoms? Symptoms of this condition depend on which of your nerves is damaged. Symptoms in the legs, hands, and arms can include: Loss of feeling (numbness) in the feet, hands, or both. Tingling in the feet, hands, or both. Burning pain. Very sensitive skin. Weakness. Not being able to move a part of the body (paralysis). Clumsiness or poor coordination. Muscle twitching. Loss of balance. Symptoms in other parts of the body can include: Not being able to control your bladder. Feeling dizzy. Sexual problems. How is this diagnosed? Diagnosing and finding the cause of peripheral neuropathy can be difficult. Your health care provider will take your  medical history and do a physical exam. A neurological exam will also be done. This involves checking things that are affected by your brain, spinal cord, and nerves (nervous system). For example, your health care provider will check your reflexes, how you move, and what you can feel. You may have other tests, such as: Blood tests. Electromyogram (EMG) and nerve conduction tests. These tests check nerve function and how well the nerves are controlling the muscles. Imaging tests, such as a CT scan or MRI, to rule out other causes of your symptoms. Removing a small piece of nerve to be examined in a lab (nerve biopsy). Removing and examining a small amount of the fluid that surrounds the brain and spinal cord (lumbar puncture). How is this treated? Treatment for this condition may involve: Treating the underlying cause of the neuropathy, such as diabetes, kidney disease, or vitamin deficiencies. Stopping medicines that can cause neuropathy, such as chemotherapy. Medicine to help relieve pain. Medicines may include: Prescription or over-the-counter pain medicine. Anti-seizure medicine. Antidepressants. Pain-relieving patches that are applied to painful areas of skin. Surgery to relieve pressure on a nerve or to destroy a nerve that is causing pain. Physical therapy to help improve movement and balance. Devices to help you move around (assistive devices). Follow these instructions at home: Medicines Take over-the-counter and prescription medicines only as told by your health care provider. Do not take any other medicines without first asking your health care provider. Ask your health care provider if the medicine prescribed to you requires you to avoid driving or using machinery. Lifestyle  Do not use any products that contain nicotine or tobacco. These products include cigarettes, chewing tobacco, and vaping devices, such as e-cigarettes. Smoking keeps  blood from reaching damaged nerves. If you  need help quitting, ask your health care provider. Avoid or limit alcohol. Too much alcohol can cause a vitamin B deficiency, and vitamin B is needed for healthy nerves. Eat a healthy diet. This includes: Eating foods that are high in fiber, such as beans, whole grains, and fresh fruits and vegetables. Limiting foods that are high in fat and processed sugars, such as fried or sweet foods. General instructions  If you have diabetes, work closely with your health care provider to keep your blood sugar under control. If you have numbness in your feet: Check every day for signs of injury or infection. Watch for redness, warmth, and swelling. Wear padded socks and comfortable shoes. These help protect your feet. Develop a good support system. Living with peripheral neuropathy can be stressful. Consider talking with a mental health specialist or joining a support group. Use assistive devices and attend physical therapy as told by your health care provider. This may include using a walker or a cane. Keep all follow-up visits. This is important. Where to find more information [employer] of Neurological Disorders: [employer] Contact a health care provider if: You have new signs or symptoms of peripheral neuropathy. You are struggling emotionally from dealing with peripheral neuropathy. Your pain is not well controlled. Get help right away if: You have an injury or infection that is not healing normally. You develop new weakness in an arm or leg. You have fallen or do so frequently. Summary Peripheral neuropathy is when the nerves in the arms or legs are damaged, resulting in numbness, weakness, or pain. There are many causes of peripheral neuropathy, including diabetes, pinched nerves, vitamin deficiencies, autoimmune disease, and hereditary conditions. Diagnosing and finding the cause of peripheral neuropathy can be difficult. Your health care provider will take your medical  history, do a physical exam, and do tests, including blood tests and nerve function tests. Treatment involves treating the underlying cause of the neuropathy and taking medicines to help control pain. Physical therapy and assistive devices may also help. This information is not intended to replace advice given to you by your health care provider. Make sure you discuss any questions you have with your health care provider. Document Revised: 11/29/2020 Document Reviewed: 11/29/2020 Elsevier Patient Education  2024 [employer].

## 2022-09-04 NOTE — Progress Notes (Signed)
Subjective:    Patient ID: Kristin Ballard, female    DOB: 1985-06-22, 37 y.o.   MRN: [id number]  Chief Complaint  Patient presents with   Medical Management of Chronic Issues   facial numdness    Extremity Weakness   Night Sweats    Pt presents to the office today for chronic follow up  She is followed by  Neurologists as needed for guillain-barre syndrome. She completed PT.    She was taking OC, her last menstrual cycle was 21 days ago.   Reports having increased numbness and tingling in bilateral legs and feet. She takes gabapentin 300 mg TID and Cymbalta 30 mg.  Extremity Weakness   Gastroesophageal Reflux She complains of belching, heartburn and a hoarse voice. This is a chronic problem. The current episode started more than 1 year ago. The problem occurs occasionally. Risk factors include obesity. She has tried a PPI for the symptoms. The treatment provided moderate relief.  Anemia Presents for follow-up visit. Symptoms include malaise/fatigue.  Depression        This is a chronic problem.  The current episode started more than 1 year ago.   Associated symptoms include sad.  Associated symptoms include no helplessness and no hopelessness.  Past treatments include SNRIs - Serotonin and norepinephrine reuptake inhibitors. Nicotine Dependence Presents for follow-up visit. Her urge triggers include company of smokers. The symptoms have been stable. She smokes < 1/2 a pack of cigarettes per day.  Constipation This is a chronic problem. The current episode started more than 1 year ago. The problem has been resolved since onset. Her stool frequency is 1 time per day. She has tried diet changes for the symptoms. The treatment provided moderate relief.      Review of Systems  Constitutional:  Positive for malaise/fatigue.  HENT:  Positive for hoarse voice.   Gastrointestinal:  Positive for constipation and heartburn.  Musculoskeletal:  Positive for extremity weakness.   Psychiatric/Behavioral:  Positive for depression.   All other systems reviewed and are negative.      Objective:   Physical Exam Vitals reviewed.  Constitutional:      General: She is not in acute distress.    Appearance: She is well-developed. She is obese.  HENT:     Head: Normocephalic and atraumatic.     Right Ear: Tympanic membrane normal.     Left Ear: Tympanic membrane normal.  Eyes:     Pupils: Pupils are equal, round, and reactive to light.  Neck:     Thyroid: No thyromegaly.  Cardiovascular:     Rate and Rhythm: Normal rate and regular rhythm.     Heart sounds: Normal heart sounds. No murmur heard. Pulmonary:     Effort: Pulmonary effort is normal. No respiratory distress.     Breath sounds: Normal breath sounds. No wheezing.  Abdominal:     General: Bowel sounds are normal. There is no distension.     Palpations: Abdomen is soft.     Tenderness: There is no abdominal tenderness.  Musculoskeletal:        General: No tenderness. Normal range of motion.     Cervical back: Normal range of motion and neck supple.  Skin:    General: Skin is warm and dry.  Neurological:     Mental Status: She is alert and oriented to person, place, and time.     Cranial Nerves: No cranial nerve deficit.     Deep Tendon Reflexes: Reflexes are normal and  symmetric.  Psychiatric:        Behavior: Behavior normal.        Thought Content: Thought content normal.        Judgment: Judgment normal.       BP (!) 140/87   Pulse 84   Temp (!) 97.3 F (36.3 C) (Temporal)   Ht 5\' 3"  (1.6 m)   Wt 285 lb 9.6 oz (129.5 kg)   BMI 50.59 kg/m      Assessment & Plan:  Kristin Ballard comes in today with chief complaint of Medical Management of Chronic Issues, facial numdness , Extremity Weakness, and Night Sweats   Diagnosis and orders addressed:  1. Guillain Barr syndrome (HCC) - CMP14+EGFR - CBC with Differential/Platelet  2. Gastroesophageal reflux disease, unspecified  whether esophagitis present - CMP14+EGFR - CBC with Differential/Platelet - TSH  3. Depression, major, single episode, mild (HCC) Will increase Cymbalta to 60 mg from 30 mg Stress management  - CMP14+EGFR - CBC with Differential/Platelet - TSH - DULoxetine (CYMBALTA) 60 MG capsule; Take 1 capsule (60 mg total) by mouth daily.  Dispense: 90 capsule; Refill: 1  4. Current smoker - CMP14+EGFR - CBC with Differential/Platelet  5. Constipation, unspecified constipation type - CMP14+EGFR - CBC with Differential/Platelet  6. Birth control counseling - CMP14+EGFR - CBC with Differential/Platelet  7. Iron deficiency anemia, unspecified iron deficiency anemia type - CMP14+EGFR - CBC with Differential/Platelet  8. Vitamin D deficiency - CMP14+EGFR - CBC with Differential/Platelet - VITAMIN D 25 Hydroxy (Vit-D Deficiency, Fractures)  9. Vitamin B 12 deficiency - CMP14+EGFR - CBC with Differential/Platelet - Vitamin B12  10. Morbid obesity (HCC) - CMP14+EGFR - CBC with Differential/Platelet   Labs pending Health Maintenance reviewed Diet and exercise encouraged  Follow up plan: 6 weeks for depression    Jannifer Rodney, FNP

## 2022-09-05 LAB — CMP14+EGFR
ALT: 16 IU/L (ref 0–32)
AST: 27 IU/L (ref 0–40)
Albumin/Globulin Ratio: 1.5 (ref 1.2–2.2)
Albumin: 3.8 g/dL — ABNORMAL LOW (ref 3.9–4.9)
Alkaline Phosphatase: 63 IU/L (ref 44–121)
BUN/Creatinine Ratio: 7 — ABNORMAL LOW (ref 9–23)
BUN: 6 mg/dL (ref 6–20)
Bilirubin Total: 0.5 mg/dL (ref 0.0–1.2)
CO2: 24 mmol/L (ref 20–29)
Calcium: 8.6 mg/dL — ABNORMAL LOW (ref 8.7–10.2)
Chloride: 97 mmol/L (ref 96–106)
Creatinine, Ser: 0.85 mg/dL (ref 0.57–1.00)
Globulin, Total: 2.5 g/dL (ref 1.5–4.5)
Glucose: 103 mg/dL — ABNORMAL HIGH (ref 70–99)
Potassium: 3.3 mmol/L — ABNORMAL LOW (ref 3.5–5.2)
Sodium: 140 mmol/L (ref 134–144)
Total Protein: 6.3 g/dL (ref 6.0–8.5)
eGFR: 89 mL/min/{1.73_m2} (ref >59)

## 2022-09-05 LAB — CBC WITH DIFFERENTIAL/PLATELET
Basophils Absolute: 0 10*3/uL (ref 0.0–0.2)
Basos: 1 %
EOS (ABSOLUTE): 0.1 10*3/uL (ref 0.0–0.4)
Eos: 2 %
Hematocrit: 35.1 % (ref 34.0–46.6)
Hemoglobin: 11.5 g/dL (ref 11.1–15.9)
Immature Grans (Abs): 0 10*3/uL (ref 0.0–0.1)
Immature Granulocytes: 0 %
Lymphocytes Absolute: 2.5 10*3/uL (ref 0.7–3.1)
Lymphs: 46 %
MCH: 29.4 pg (ref 26.6–33.0)
MCHC: 32.8 g/dL (ref 31.5–35.7)
MCV: 90 fL (ref 79–97)
Monocytes Absolute: 0.2 10*3/uL (ref 0.1–0.9)
Monocytes: 4 %
Neutrophils Absolute: 2.6 10*3/uL (ref 1.4–7.0)
Neutrophils: 47 %
Platelets: 207 10*3/uL (ref 150–450)
RBC: 3.91 x10E6/uL (ref 3.77–5.28)
RDW: 13 % (ref 11.7–15.4)
WBC: 5.5 10*3/uL (ref 3.4–10.8)

## 2022-09-05 LAB — VITAMIN D 25 HYDROXY (VIT D DEFICIENCY, FRACTURES): Vit D, 25-Hydroxy: 49.7 ng/mL (ref 30.0–100.0)

## 2022-09-05 LAB — VITAMIN B12: Vitamin B-12: 1045 pg/mL (ref 232–1245)

## 2022-09-05 LAB — TSH: TSH: 1.86 u[IU]/mL (ref 0.450–4.500)

## 2022-09-06 ENCOUNTER — Other Ambulatory Visit: Payer: Self-pay | Admitting: Family

## 2022-09-06 MED ORDER — POTASSIUM CHLORIDE CRYS ER 20 MEQ PO TBCR: 20.0000 meq | EXTENDED_RELEASE_TABLET | Freq: Every day | ORAL | 1 refills | Status: DC

## 2022-09-27 ENCOUNTER — Other Ambulatory Visit: Payer: Self-pay | Admitting: Family

## 2022-10-02 ENCOUNTER — Ambulatory Visit (INDEPENDENT_AMBULATORY_CARE_PROVIDER_SITE_OTHER): Payer: Medicaid Other

## 2022-10-02 DIAGNOSIS — E538 Deficiency of other specified B group vitamins: Secondary | ICD-10-CM | POA: Diagnosis not present

## 2022-10-02 NOTE — Progress Notes (Signed)
Cyanocobalamin injection given to right deltoid.  Patient tolerated well. 

## 2022-10-16 ENCOUNTER — Ambulatory Visit (INDEPENDENT_AMBULATORY_CARE_PROVIDER_SITE_OTHER): Payer: Medicaid Other | Admitting: Family

## 2022-10-16 ENCOUNTER — Encounter: Payer: Self-pay | Admitting: Family

## 2022-10-16 VITALS — BP 131/85 | HR 97 | Temp 97.8°F | Ht 63.0 in | Wt 285.0 lb

## 2022-10-16 DIAGNOSIS — G8929 Other chronic pain: Secondary | ICD-10-CM

## 2022-10-16 DIAGNOSIS — M47816 Spondylosis without myelopathy or radiculopathy, lumbar region: Secondary | ICD-10-CM

## 2022-10-16 DIAGNOSIS — M545 Low back pain, unspecified: Secondary | ICD-10-CM

## 2022-10-16 DIAGNOSIS — F320 Major depressive disorder, single episode, mild: Secondary | ICD-10-CM

## 2022-10-16 DIAGNOSIS — G629 Polyneuropathy, unspecified: Secondary | ICD-10-CM

## 2022-10-16 DIAGNOSIS — E876 Hypokalemia: Secondary | ICD-10-CM

## 2022-10-16 DIAGNOSIS — K219 Gastro-esophageal reflux disease without esophagitis: Secondary | ICD-10-CM

## 2022-10-16 DIAGNOSIS — M48061 Spinal stenosis, lumbar region without neurogenic claudication: Secondary | ICD-10-CM

## 2022-10-16 MED ORDER — OMEPRAZOLE 40 MG PO CPDR
40.0000 mg | DELAYED_RELEASE_CAPSULE | Freq: Every day | ORAL | 1 refills | Status: DC
Start: 2022-10-16 — End: 2023-05-02

## 2022-10-16 MED ORDER — BACLOFEN 10 MG PO TABS
ORAL_TABLET | ORAL | 2 refills | Status: DC
Start: 2022-10-16 — End: 2023-03-04

## 2022-10-16 NOTE — Patient Instructions (Signed)
Peripheral Neuropathy Peripheral neuropathy is a type of nerve damage. It affects nerves that carry signals between the spinal cord and the arms, legs, and the rest of the body (peripheral nerves). It does not affect nerves in the spinal cord or brain. In peripheral neuropathy, one nerve or a group of nerves may be damaged. Peripheral neuropathy is a broad category that includes many specific nerve disorders, like diabetic neuropathy, hereditary neuropathy, and carpal tunnel syndrome. What are the causes? This condition may be caused by: Certain diseases, such as: Diabetes. This is the most common cause of peripheral neuropathy. Autoimmune diseases, such as rheumatoid arthritis and systemic lupus erythematosus. Nerve diseases that are passed from parent to child (inherited). Kidney disease. Thyroid disease. Other causes may include: Nerve injury. Pressure or stress on a nerve that lasts a long time. Lack (deficiency) of B vitamins. This can result from alcoholism, poor diet, or a restricted diet. Infections. Some medicines, such as cancer medicines (chemotherapy). Poisonous (toxic) substances, such as lead and mercury. Too little blood flowing to the legs. In some cases, the cause of this condition is not known. What are the signs or symptoms? Symptoms of this condition depend on which of your nerves is damaged. Symptoms in the legs, hands, and arms can include: Loss of feeling (numbness) in the feet, hands, or both. Tingling in the feet, hands, or both. Burning pain. Very sensitive skin. Weakness. Not being able to move a part of the body (paralysis). Clumsiness or poor coordination. Muscle twitching. Loss of balance. Symptoms in other parts of the body can include: Not being able to control your bladder. Feeling dizzy. Sexual problems. How is this diagnosed? Diagnosing and finding the cause of peripheral neuropathy can be difficult. Your health care provider will take your  medical history and do a physical exam. A neurological exam will also be done. This involves checking things that are affected by your brain, spinal cord, and nerves (nervous system). For example, your health care provider will check your reflexes, how you move, and what you can feel. You may have other tests, such as: Blood tests. Electromyogram (EMG) and nerve conduction tests. These tests check nerve function and how well the nerves are controlling the muscles. Imaging tests, such as a CT scan or MRI, to rule out other causes of your symptoms. Removing a small piece of nerve to be examined in a lab (nerve biopsy). Removing and examining a small amount of the fluid that surrounds the brain and spinal cord (lumbar puncture). How is this treated? Treatment for this condition may involve: Treating the underlying cause of the neuropathy, such as diabetes, kidney disease, or vitamin deficiencies. Stopping medicines that can cause neuropathy, such as chemotherapy. Medicine to help relieve pain. Medicines may include: Prescription or over-the-counter pain medicine. Anti-seizure medicine. Antidepressants. Pain-relieving patches that are applied to painful areas of skin. Surgery to relieve pressure on a nerve or to destroy a nerve that is causing pain. Physical therapy to help improve movement and balance. Devices to help you move around (assistive devices). Follow these instructions at home: Medicines Take over-the-counter and prescription medicines only as told by your health care provider. Do not take any other medicines without first asking your health care provider. Ask your health care provider if the medicine prescribed to you requires you to avoid driving or using machinery. Lifestyle  Do not use any products that contain nicotine or tobacco. These products include cigarettes, chewing tobacco, and vaping devices, such as e-cigarettes. Smoking keeps   blood from reaching damaged nerves. If you  need help quitting, ask your health care provider. Avoid or limit alcohol. Too much alcohol can cause a vitamin B deficiency, and vitamin B is needed for healthy nerves. Eat a healthy diet. This includes: Eating foods that are high in fiber, such as beans, whole grains, and fresh fruits and vegetables. Limiting foods that are high in fat and processed sugars, such as fried or sweet foods. General instructions  If you have diabetes, work closely with your health care provider to keep your blood sugar under control. If you have numbness in your feet: Check every day for signs of injury or infection. Watch for redness, warmth, and swelling. Wear padded socks and comfortable shoes. These help protect your feet. Develop a good support system. Living with peripheral neuropathy can be stressful. Consider talking with a mental health specialist or joining a support group. Use assistive devices and attend physical therapy as told by your health care provider. This may include using a walker or a cane. Keep all follow-up visits. This is important. Where to find more information National Institute of Neurological Disorders: www.ninds.nih.gov Contact a health care provider if: You have new signs or symptoms of peripheral neuropathy. You are struggling emotionally from dealing with peripheral neuropathy. Your pain is not well controlled. Get help right away if: You have an injury or infection that is not healing normally. You develop new weakness in an arm or leg. You have fallen or do so frequently. Summary Peripheral neuropathy is when the nerves in the arms or legs are damaged, resulting in numbness, weakness, or pain. There are many causes of peripheral neuropathy, including diabetes, pinched nerves, vitamin deficiencies, autoimmune disease, and hereditary conditions. Diagnosing and finding the cause of peripheral neuropathy can be difficult. Your health care provider will take your medical  history, do a physical exam, and do tests, including blood tests and nerve function tests. Treatment involves treating the underlying cause of the neuropathy and taking medicines to help control pain. Physical therapy and assistive devices may also help. This information is not intended to replace advice given to you by your health care provider. Make sure you discuss any questions you have with your health care provider. Document Revised: 11/29/2020 Document Reviewed: 11/29/2020 Elsevier Patient Education  2024 [employer]  

## 2022-10-16 NOTE — Progress Notes (Signed)
Subjective:    Patient ID: Kristin Ballard, female    DOB: July 01, 1985, 37 y.o.   MRN: [id number] Chief Complaint  Patient presents with   Depression    6 weeks for depression     Gastroesophageal Reflux    Wants to up prilosec   PT presents to the office today to recheck  Depression. She was seen on 09/04/22 and we increased her Cymbalta to 60 mg from 30 mg. She reports this has helped the burning in her feet.   Reports tingling pain of bilateral feet of 5 out 10.   Her potassium was low and started potassium chloride 20 meq.  Depression        This is a chronic problem.  The current episode started more than 1 year ago.   The problem occurs intermittently.  Associated symptoms include no helplessness, no hopelessness and not sad.  Past treatments include SNRIs - Serotonin and norepinephrine reuptake inhibitors. Gastroesophageal Reflux She complains of belching, heartburn and nausea. This is a chronic problem. The current episode started more than 1 year ago. The problem occurs frequently. She has tried a PPI for the symptoms. The treatment provided moderate relief.  Back Pain This is a chronic problem. The current episode started more than 1 year ago. The problem occurs intermittently. The problem has been waxing and waning since onset. The pain is present in the lumbar spine. The quality of the pain is described as aching. The pain is at a severity of 10/10 (when standing for long periods of time). The pain is mild. The symptoms are aggravated by twisting. Risk factors include obesity. She has tried muscle relaxant for the symptoms. The treatment provided mild relief.      Review of Systems  Gastrointestinal:  Positive for heartburn and nausea.  Musculoskeletal:  Positive for back pain.  Psychiatric/Behavioral:  Positive for depression.   All other systems reviewed and are negative.      Objective:   Physical Exam Vitals reviewed.  Constitutional:      General: She is not  in acute distress.    Appearance: She is well-developed. She is obese.  HENT:     Head: Normocephalic and atraumatic.     Right Ear: Tympanic membrane normal.     Left Ear: Tympanic membrane normal.  Eyes:     Pupils: Pupils are equal, round, and reactive to light.  Neck:     Thyroid: No thyromegaly.  Cardiovascular:     Rate and Rhythm: Normal rate and regular rhythm.     Heart sounds: Normal heart sounds. No murmur heard. Pulmonary:     Effort: Pulmonary effort is normal. No respiratory distress.     Breath sounds: Normal breath sounds. No wheezing.  Abdominal:     General: Bowel sounds are normal. There is no distension.     Palpations: Abdomen is soft.     Tenderness: There is no abdominal tenderness.  Musculoskeletal:        General: No tenderness. Normal range of motion.     Cervical back: Normal range of motion and neck supple.     Comments: Pain in lumbar when standing   Skin:    General: Skin is warm and dry.  Neurological:     Mental Status: She is alert and oriented to person, place, and time.     Cranial Nerves: No cranial nerve deficit.     Deep Tendon Reflexes: Reflexes are normal and symmetric.  Psychiatric:  Behavior: Behavior normal.        Thought Content: Thought content normal.        Judgment: Judgment normal.   BP 131/85   Pulse 97   Temp 97.8 F (36.6 C) (Temporal)   Ht 5\' 3"  (1.6 m)   Wt 285 lb (129.3 kg)   SpO2 96%   BMI 50.49 kg/m       Assessment & Plan:   MARLISE FAHR comes in today with chief complaint of Depression (6 weeks for depression /) and Gastroesophageal Reflux (Wants to up prilosec)   Diagnosis and orders addressed:  1. Gastroesophageal reflux disease, unspecified whether esophagitis present Will increase Prilosec to 40 mg from 20 mg  -Diet discussed- Avoid fried, spicy, citrus foods, caffeine and alcohol -Do not eat 2-3 hours before bedtime -Encouraged small frequent meals -Avoid NSAID's - omeprazole  (PRILOSEC) 40 MG capsule; Take 1 capsule (40 mg total) by mouth daily.  Dispense: 90 capsule; Refill: 1 - BMP8+EGFR  2. Depression, major, single episode, mild (HCC) - BMP8+EGFR  3. Peripheral polyneuropathy Continue Cymbalta 60 mg  - BMP8+EGFR  4. Chronic bilateral low back pain without sciatica Referral to Ortho  Baclofen as needed  - baclofen (LIORESAL) 10 MG tablet; TAKE 1/2 (ONE-HALF) TABLET BY MOUTH 4 TIMES DAILY AS NEEDED  Dispense: 90 tablet; Refill: 2 - Ambulatory referral to Orthopedics - BMP8+EGFR  5. Osteoarthritis of lumbar spine, unspecified spinal osteoarthritis complication status - Ambulatory referral to Orthopedics - BMP8+EGFR  6. Neural foraminal stenosis of lumbar spine - Ambulatory referral to Orthopedics - BMP8+EGFR  7. Hypokalemia - BMP8+EGFR   Labs pending Health Maintenance reviewed Diet and exercise encouraged  Follow up plan: 4 months    Jannifer Rodney, FNP

## 2022-10-17 LAB — BMP8+EGFR
BUN/Creatinine Ratio: 10 (ref 9–23)
BUN: 9 mg/dL (ref 6–20)
CO2: 24 mmol/L (ref 20–29)
Calcium: 9.3 mg/dL (ref 8.7–10.2)
Chloride: 98 mmol/L (ref 96–106)
Creatinine, Ser: 0.88 mg/dL (ref 0.57–1.00)
Glucose: 91 mg/dL (ref 70–99)
Potassium: 4.7 mmol/L (ref 3.5–5.2)
Sodium: 137 mmol/L (ref 134–144)
eGFR: 86 mL/min/{1.73_m2} (ref >59)

## 2022-10-23 ENCOUNTER — Other Ambulatory Visit: Payer: Self-pay | Admitting: Family

## 2022-11-02 ENCOUNTER — Ambulatory Visit (INDEPENDENT_AMBULATORY_CARE_PROVIDER_SITE_OTHER): Payer: Medicaid Other

## 2022-11-02 DIAGNOSIS — E538 Deficiency of other specified B group vitamins: Secondary | ICD-10-CM | POA: Diagnosis not present

## 2022-11-02 NOTE — Progress Notes (Signed)
Cyanocobalamin injection given to left deltoid.  Patient tolerated well. 

## 2022-11-14 ENCOUNTER — Ambulatory Visit: Payer: Medicaid Other | Admitting: Orthopedic Surgery

## 2022-11-29 ENCOUNTER — Ambulatory Visit: Payer: Medicaid Other | Admitting: Orthopedic Surgery

## 2022-12-05 ENCOUNTER — Ambulatory Visit: Payer: Medicaid Other

## 2022-12-05 ENCOUNTER — Other Ambulatory Visit: Payer: Self-pay | Admitting: Family

## 2022-12-12 ENCOUNTER — Ambulatory Visit (INDEPENDENT_AMBULATORY_CARE_PROVIDER_SITE_OTHER): Payer: Medicaid Other

## 2022-12-12 DIAGNOSIS — E538 Deficiency of other specified B group vitamins: Secondary | ICD-10-CM | POA: Diagnosis not present

## 2022-12-12 NOTE — Progress Notes (Signed)
B12 injection given to patient and tolerated well.  

## 2022-12-14 ENCOUNTER — Other Ambulatory Visit: Payer: Self-pay | Admitting: Family

## 2022-12-24 ENCOUNTER — Encounter: Payer: Self-pay | Admitting: Orthopedic Surgery

## 2022-12-24 ENCOUNTER — Ambulatory Visit: Payer: Medicaid Other | Admitting: Orthopedic Surgery

## 2022-12-24 ENCOUNTER — Other Ambulatory Visit (INDEPENDENT_AMBULATORY_CARE_PROVIDER_SITE_OTHER): Payer: Medicaid Other

## 2022-12-24 VITALS — BP 146/102 | HR 103 | Ht 63.0 in | Wt 285.0 lb

## 2022-12-24 DIAGNOSIS — M545 Low back pain, unspecified: Secondary | ICD-10-CM

## 2022-12-24 DIAGNOSIS — G8929 Other chronic pain: Secondary | ICD-10-CM

## 2022-12-24 NOTE — Progress Notes (Signed)
Orthopedic Spine Surgery Office Note  Assessment: Patient is a 37 y.o. female with chronic low back pain. Has pain that radiates in multiple distributions in her bilateral legs distal to the knee. MRI without significant stenosis   Plan: -Patient has tried PT, tylenol, ibuprofen  -Recommended lumbar steroid injection -If she does not get adequate relief with that then may consider referral pain management -Discussed weight loss to help with her low back pain and overall health. I told her to work on core strengthening as well -Patient would need to get down to a weight of 220 and be nicotine free prior to any elective spine surgery -Patient should return to office in 6 weeks, x-rays at next visit: none   Patient expressed understanding of the plan and all questions were answered to the patient's satisfaction.   ___________________________________________________________________________   History:  Patient is a 37 y.o. female who presents today for lumbar spine.  Patient has had several years of low back pain that has gotten progressively worse.  She states that it starts in her lower lumbar and sometimes will radiate into her upper lumbar spine if she is upright for more than 10 minutes.  She also has pain that radiates into her legs.  She says she notices this pain when she is more active.  She does not have this pain at rest or when seated.  The pain is felt distal to the knees bilaterally.  She feels it in multiple distributions distal to the knees.  She also has decree sensation distal to the knees at all times.   Weakness: Yes, lower back and legs feel weaker.  No other weakness noted  Symptoms of imbalance: Yes, when she is more active she feels her legs are weaker and has trouble with balance Paresthesias and numbness: Yes, has decreased sensation distal to the knees bilaterally Bowel or bladder incontinence: denies Saddle anesthesia: denies  Treatments tried: PT, tylenol,  ibuprofen  Review of systems: Denies fevers and chills, night sweats, unexplained weight loss, history of cancer, pain that wakes them at night  Past medical history: Guillain Barr syndrome Depression/Anxiety GERD Neuropathy  Allergies: Latex, tizanidine  Past surgical history:  Wisdom tooth removal Right ankle fracture ORIF  Social history: Reports use of nicotine product (smoking, vaping, patches, smokeless) Alcohol use: Yes, approximately 6 drinks per week Denies recreational drug use   Physical Exam:  BMI of 50.5  General: no acute distress, appears stated age Neurologic: alert, answering questions appropriately, following commands Respiratory: unlabored breathing on room air, symmetric chest rise Psychiatric: appropriate affect, normal cadence to speech   MSK (spine):  -Strength exam      Left  Right EHL    5/5  5/5 TA    5/5  5/5 GSC    5/5  5/5 Knee extension  5/5  5/5 Hip flexion   5/5  5/5  -Sensory exam    Sensation intact to light touch in L3-S1 nerve distributions of bilateral lower extremities  -Achilles DTR: 1/4 on the left, 1/4 on the right -Patellar tendon DTR: 1/4 on the left, 1/4 on the right  -Straight leg raise: Negative bilaterally -Femoral nerve stretch test: Negative bilaterally -Clonus: no beats bilaterally  -Left hip exam: No pain through range of motion, negative Stinchfield, negative FABER, negative SI joint compression test -Right hip exam: No pain through range of motion, negative Stinchfield, negative FABER, negative SI joint compression test  Imaging: XRs of the lumbar spine from 12/24/2022 were independently reviewed and  interpreted, showing disc height loss at L4/5.  No other significant degenerative changes seen.  No fracture or dislocation.  No evidence of instability on flexion/extension views.  MRI of the lumbar spine from 08/22/2021 was independently reviewed and interpreted, showing disc desiccation and bulge at L3/4.   No significant stenosis seen.   Patient name: Kristin Ballard Patient MRN: [id number] Date of visit: 12/24/22

## 2022-12-26 ENCOUNTER — Other Ambulatory Visit: Payer: Self-pay | Admitting: Family

## 2022-12-26 DIAGNOSIS — Z3009 Encounter for other general counseling and advice on contraception: Secondary | ICD-10-CM

## 2023-01-01 ENCOUNTER — Other Ambulatory Visit: Payer: Self-pay | Admitting: Physical Medicine and Rehabilitation

## 2023-01-01 ENCOUNTER — Telehealth: Payer: Self-pay

## 2023-01-01 MED ORDER — DIAZEPAM 5 MG PO TABS: ORAL_TABLET | ORAL | 0 refills | Status: DC

## 2023-01-01 NOTE — Telephone Encounter (Signed)
Patient scheduled for injection on 01/07/23. Needs pre procedure Valium sent to [facility]

## 2023-01-07 ENCOUNTER — Ambulatory Visit: Payer: Medicaid Other | Admitting: Physical Medicine and Rehabilitation

## 2023-01-07 ENCOUNTER — Other Ambulatory Visit: Payer: Self-pay

## 2023-01-07 VITALS — BP 143/84 | HR 92

## 2023-01-07 DIAGNOSIS — M5416 Radiculopathy, lumbar region: Secondary | ICD-10-CM

## 2023-01-07 MED ORDER — METHYLPREDNISOLONE ACETATE 80 MG/ML IJ SUSP
80.0000 mg | Freq: Once | INTRAMUSCULAR | Status: AC
  Administered 2023-01-07: 80 mg

## 2023-01-07 NOTE — Progress Notes (Signed)
Kristin Ballard - 37 y.o. female MRN [id number]  Date of birth: [birth year]  Office Visit Note: Visit Date: 01/07/2023 PCP: Junie Spencer, FNP Referred by: London Sheer, MD  Subjective: Chief Complaint  Patient presents with   Lower Back - Pain   HPI:  Kristin Ballard is a 37 y.o. female who comes in today at the request of Dr. Willia Craze for planned Right L3-4 Lumbar Interlaminar epidural steroid injection with fluoroscopic guidance.  The patient has failed conservative care including home exercise, medications, time and activity modification.  This injection will be diagnostic and hopefully therapeutic.  Please see requesting physician notes for further details and justification.   ROS Otherwise per HPI.  Assessment & Plan: Visit Diagnoses:    ICD-10-CM   1. Lumbar radiculopathy  M54.16 XR C-ARM NO REPORT    Epidural Steroid injection    methylPREDNISolone acetate (DEPO-MEDROL) injection 80 mg      Plan: No additional findings.   Meds & Orders:  Meds ordered this encounter  Medications   methylPREDNISolone acetate (DEPO-MEDROL) injection 80 mg    Orders Placed This Encounter  Procedures   XR C-ARM NO REPORT   Epidural Steroid injection    Follow-up: Return for visit to requesting provider as needed.   Procedures: No procedures performed  Lumbar Epidural Steroid Injection - Interlaminar Approach with Fluoroscopic Guidance  Patient: Kristin Ballard      Date of Birth: [birth year] MRN: [id number] PCP: Junie Spencer, FNP      Visit Date: 01/07/2023   Universal Protocol:     Consent Given By: the patient  Position: PRONE  Additional Comments: Vital signs were monitored before and after the procedure. Patient was prepped and draped in the usual sterile fashion. The correct patient, procedure, and site was verified.   Injection Procedure Details:   Procedure diagnoses: Lumbar radiculopathy [M54.16]   Meds Administered:  Meds ordered this  encounter  Medications   methylPREDNISolone acetate (DEPO-MEDROL) injection 80 mg     Laterality: Midline  Location/Site:  L3-4  Needle: 4.5 in., 20 ga. Tuohy  Needle Placement: Paramedian epidural  Findings:   -Comments: Excellent flow of contrast into the epidural space.  Procedure Details: Using a paramedian approach from the side mentioned above, the region overlying the inferior lamina was localized under fluoroscopic visualization and the soft tissues overlying this structure were infiltrated with 4 ml. of 1% Lidocaine without Epinephrine. The Tuohy needle was inserted into the epidural space using a paramedian approach.   The epidural space was localized using loss of resistance along with counter oblique bi-planar fluoroscopic views.  After negative aspirate for air, blood, and CSF, a 2 ml. volume of Isovue-250 was injected into the epidural space and the flow of contrast was observed. Radiographs were obtained for documentation purposes.    The injectate was administered into the level noted above.   Additional Comments:  No complications occurred Dressing: 2 x 2 sterile gauze and Band-Aid    Post-procedure details: Patient was observed during the procedure. Post-procedure instructions were reviewed.  Patient left the clinic in stable condition.   Clinical History: MRI LUMBAR SPINE WITHOUT AND WITH CONTRAST   TECHNIQUE: Multiplanar and multiecho pulse sequences of the lumbar spine were obtained without and with intravenous contrast.   CONTRAST:  20mL MULTIHANCE GADOBENATE DIMEGLUMINE 529 MG/ML IV SOLN   COMPARISON:  Report of [facility] CT Abdomen and Pelvis 12/09/2010 (no images available).   FINDINGS: Segmentation: Lumbar segmentation  appears to be normal and will be designated as such for this report.   Alignment:  Normal lumbar lordosis.  No spondylolisthesis.   Vertebrae: No marrow edema or evidence of acute osseous  abnormality. Visualized bone marrow signal is within normal limits. Intact visible sacrum and SI joints.   Conus medullaris and cauda equina: Conus extends to the L1 level. No lower spinal cord or conus signal abnormality. No abnormal intradural enhancement or dural thickening. Cauda equina nerve roots appear normal.   Paraspinal and other soft tissues: Negative.   Disc levels:   T10-T11: Partially visible, grossly negative.   T11-T12: Negative.   T12-L1:  Negative.   L1-L2:  Negative.   L2-L3: Mild far lateral disc bulging. Mild facet hypertrophy greater on the right. Trace degenerative facet joint fluid. No significant stenosis.   L3-L4: Disc desiccation. Mild far lateral disc bulging and superimposed broad-based but small central disc protrusion (series 6, image 17). Mild to moderate facet and ligament flavum hypertrophy. No significant spinal or lateral recess stenosis. Mild to moderate left L3 neural foraminal stenosis.   L4-L5: Mild far lateral disc bulging greater on the right. Mild to moderate facet and ligament flavum hypertrophy. Mild degenerative signal in the interspinous ligament. No spinal or lateral recess stenosis. Mild left and mild to moderate right L4 neural foraminal stenosis.   L5-S1:  Negative disc.  Mild facet hypertrophy.  No stenosis.   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   Disc bulging and facet hypertrophy combine for up to moderate neural foraminal stenosis at the left L3 and right L4 nerve levels. Mild left L4 foraminal stenosis.     Electronically Signed   By: Odessa Fleming M.D.   On: 08/22/2021 10:10     Objective:  VS:  HT:    WT:   BMI:     BP:(!) 143/84  HR:92bpm  TEMP: ( )  RESP:  Physical Exam Vitals and nursing note reviewed.  Constitutional:      General: She is not in acute distress.    Appearance: Normal appearance. She is obese. She is not ill-appearing.  HENT:     Head:  Normocephalic and atraumatic.     Right Ear: External ear normal.     Left Ear: External ear normal.  Eyes:     Extraocular Movements: Extraocular movements intact.  Cardiovascular:     Rate and Rhythm: Normal rate.     Pulses: Normal pulses.  Pulmonary:     Effort: Pulmonary effort is normal. No respiratory distress.  Abdominal:     General: There is no distension.     Palpations: Abdomen is soft.  Musculoskeletal:        General: Tenderness present.     Cervical back: Neck supple.     Right lower leg: No edema.     Left lower leg: No edema.     Comments: Patient has good distal strength with no pain over the greater trochanters.  No clonus or focal weakness.  Skin:    Findings: No erythema, lesion or rash.  Neurological:     General: No focal deficit present.     Mental Status: She is alert and oriented to person, place, and time.     Sensory: No sensory deficit.     Motor: No weakness or abnormal muscle tone.     Coordination: Coordination normal.  Psychiatric:        Mood and Affect: Mood normal.  Behavior: Behavior normal.      Imaging: No results found.

## 2023-01-07 NOTE — Patient Instructions (Signed)

## 2023-01-07 NOTE — Procedures (Signed)
Lumbar Epidural Steroid Injection - Interlaminar Approach with Fluoroscopic Guidance  Patient: Kristin Ballard      Date of Birth: 12/20/1985 MRN: [id number] PCP: Junie Spencer, FNP      Visit Date: 01/07/2023   Universal Protocol:     Consent Given By: the patient  Position: PRONE  Additional Comments: Vital signs were monitored before and after the procedure. Patient was prepped and draped in the usual sterile fashion. The correct patient, procedure, and site was verified.   Injection Procedure Details:   Procedure diagnoses: Lumbar radiculopathy [M54.16]   Meds Administered:  Meds ordered this encounter  Medications   methylPREDNISolone acetate (DEPO-MEDROL) injection 80 mg     Laterality: Midline  Location/Site:  L3-4  Needle: 4.5 in., 20 ga. Tuohy  Needle Placement: Paramedian epidural  Findings:   -Comments: Excellent flow of contrast into the epidural space.  Procedure Details: Using a paramedian approach from the side mentioned above, the region overlying the inferior lamina was localized under fluoroscopic visualization and the soft tissues overlying this structure were infiltrated with 4 ml. of 1% Lidocaine without Epinephrine. The Tuohy needle was inserted into the epidural space using a paramedian approach.   The epidural space was localized using loss of resistance along with counter oblique bi-planar fluoroscopic views.  After negative aspirate for air, blood, and CSF, a 2 ml. volume of Isovue-250 was injected into the epidural space and the flow of contrast was observed. Radiographs were obtained for documentation purposes.    The injectate was administered into the level noted above.   Additional Comments:  No complications occurred Dressing: 2 x 2 sterile gauze and Band-Aid    Post-procedure details: Patient was observed during the procedure. Post-procedure instructions were reviewed.  Patient left the clinic in stable condition.

## 2023-01-07 NOTE — Progress Notes (Signed)
Functional Pain Scale - descriptive words and definitions  Distressing (6)    Pain is present/unable to complete most ADLs limited by pain/sleep is difficult and active distraction is only marginal. Moderate range order  Average Pain 7   +Driver, -BT, -Dye Allergies.  Lower back pain on both sides, mainly on right side that radiates into the legs

## 2023-01-14 ENCOUNTER — Ambulatory Visit (INDEPENDENT_AMBULATORY_CARE_PROVIDER_SITE_OTHER): Payer: Medicaid Other

## 2023-01-14 DIAGNOSIS — E538 Deficiency of other specified B group vitamins: Secondary | ICD-10-CM

## 2023-01-14 NOTE — Progress Notes (Signed)
Cyanocobalamin injection given to left deltoid.  Patient tolerated well. 

## 2023-02-06 ENCOUNTER — Other Ambulatory Visit: Payer: Self-pay | Admitting: Family

## 2023-02-06 ENCOUNTER — Ambulatory Visit: Payer: Medicaid Other | Admitting: Orthopedic Surgery

## 2023-02-06 DIAGNOSIS — M5442 Lumbago with sciatica, left side: Secondary | ICD-10-CM | POA: Diagnosis not present

## 2023-02-06 DIAGNOSIS — M5441 Lumbago with sciatica, right side: Secondary | ICD-10-CM

## 2023-02-06 DIAGNOSIS — G8929 Other chronic pain: Secondary | ICD-10-CM

## 2023-02-06 DIAGNOSIS — F320 Major depressive disorder, single episode, mild: Secondary | ICD-10-CM

## 2023-02-06 DIAGNOSIS — E559 Vitamin D deficiency, unspecified: Secondary | ICD-10-CM

## 2023-02-06 NOTE — Progress Notes (Signed)
Orthopedic Spine Surgery Office Note   Assessment: Patient is a 37 y.o. female with chronic low back pain. Has pain that radiates in multiple distributions in her bilateral legs distal to the knee. MRI without significant stenosis     Plan: -Patient has tried PT, tylenol, ibuprofen, lumbar steroid injection -Talked about some of her options at this point.  We talked about pain management and spinal cord stimulator.  After this discussion, patient wanted proceed with pain management.  Referral was provided to her today -Told her she could call the office if she wants to pursue the spinal cord stimulator option and I will place a referral to Dr. Monticello Blas office to be evaluated for candidacy for that treatment -Patient would need to get down to a weight of 220 and be nicotine free prior to any elective spine surgery -Patient should return to office on an as-needed basis     Patient expressed understanding of the plan and all questions were answered to the patient's satisfaction.    ___________________________________________________________________________     History:   Patient is a 37 y.o. female who presents today for follow up on her lumbar spine.  After our last visit, she got an injection with Dr. Alvester Morin.  She said that the injection provided her with no relief.  She says she is still having significant low back pain that radiates into bilateral lower extremities.  She feels some pain in the lateral thighs bilaterally but most of her pain is below the knee in multiple distributions.  She has not developed any new symptoms since her last office visit.   Treatments tried: PT, tylenol, ibuprofen, lumbar steroid injections    Physical Exam:   General: no acute distress, appears stated age Neurologic: alert, answering questions appropriately, following commands Respiratory: unlabored breathing on room air, symmetric chest rise Psychiatric: appropriate affect, normal cadence to speech      MSK (spine):   -Strength exam                                                   Left                  Right EHL                              5/5                  5/5 TA                                 5/5                  5/5 GSC                             5/5                  5/5 Knee extension            5/5                  5/5 Hip flexion  5/5                  5/5   -Sensory exam                           Sensation intact to light touch in L3-S1 nerve distributions of bilateral lower extremities   Imaging: XRs of the lumbar spine from 12/24/2022 were independently reviewed and interpreted, showing disc height loss at L4/5.  No other significant degenerative changes seen.  No fracture or dislocation.  No evidence of instability on flexion/extension views.   MRI of the lumbar spine from 08/22/2021 was independently reviewed and interpreted, showing disc desiccation and bulge at L3/4.  No significant stenosis seen.     Patient name: Kristin Ballard Patient MRN: [id number] Date of visit: 02/06/23

## 2023-02-07 ENCOUNTER — Other Ambulatory Visit: Payer: Self-pay | Admitting: Family

## 2023-02-14 ENCOUNTER — Ambulatory Visit (INDEPENDENT_AMBULATORY_CARE_PROVIDER_SITE_OTHER): Payer: Medicaid Other

## 2023-02-14 DIAGNOSIS — E538 Deficiency of other specified B group vitamins: Secondary | ICD-10-CM | POA: Diagnosis not present

## 2023-02-14 NOTE — Progress Notes (Signed)
B12 injection given to patient and tolerated well.  Given in right deltoid.

## 2023-02-19 ENCOUNTER — Ambulatory Visit: Payer: Medicaid Other | Admitting: Family

## 2023-02-22 ENCOUNTER — Encounter: Payer: Self-pay | Admitting: Family

## 2023-02-22 ENCOUNTER — Ambulatory Visit (INDEPENDENT_AMBULATORY_CARE_PROVIDER_SITE_OTHER): Payer: Medicaid Other | Admitting: Family

## 2023-02-22 VITALS — BP 137/94 | HR 113 | Temp 97.7°F | Ht 63.0 in | Wt 298.0 lb

## 2023-02-22 DIAGNOSIS — K219 Gastro-esophageal reflux disease without esophagitis: Secondary | ICD-10-CM | POA: Diagnosis not present

## 2023-02-22 DIAGNOSIS — E538 Deficiency of other specified B group vitamins: Secondary | ICD-10-CM

## 2023-02-22 DIAGNOSIS — Z0001 Encounter for general adult medical examination with abnormal findings: Secondary | ICD-10-CM

## 2023-02-22 DIAGNOSIS — M545 Low back pain, unspecified: Secondary | ICD-10-CM

## 2023-02-22 DIAGNOSIS — F17200 Nicotine dependence, unspecified, uncomplicated: Secondary | ICD-10-CM

## 2023-02-22 DIAGNOSIS — Z713 Dietary counseling and surveillance: Secondary | ICD-10-CM

## 2023-02-22 DIAGNOSIS — F320 Major depressive disorder, single episode, mild: Secondary | ICD-10-CM

## 2023-02-22 DIAGNOSIS — G629 Polyneuropathy, unspecified: Secondary | ICD-10-CM

## 2023-02-22 DIAGNOSIS — Z3009 Encounter for other general counseling and advice on contraception: Secondary | ICD-10-CM

## 2023-02-22 DIAGNOSIS — Z0000 Encounter for general adult medical examination without abnormal findings: Secondary | ICD-10-CM

## 2023-02-22 DIAGNOSIS — K5900 Constipation, unspecified: Secondary | ICD-10-CM

## 2023-02-22 DIAGNOSIS — D509 Iron deficiency anemia, unspecified: Secondary | ICD-10-CM

## 2023-02-22 DIAGNOSIS — E559 Vitamin D deficiency, unspecified: Secondary | ICD-10-CM

## 2023-02-22 DIAGNOSIS — M48061 Spinal stenosis, lumbar region without neurogenic claudication: Secondary | ICD-10-CM

## 2023-02-22 MED ORDER — SEMAGLUTIDE-WEIGHT MANAGEMENT 0.5 MG/0.5ML ~~LOC~~ SOAJ
0.5000 mg | SUBCUTANEOUS | 1 refills | Status: DC
Start: 2023-03-23 — End: 2023-03-12

## 2023-02-22 MED ORDER — SEMAGLUTIDE-WEIGHT MANAGEMENT 0.25 MG/0.5ML ~~LOC~~ SOAJ
0.2500 mg | SUBCUTANEOUS | 0 refills | Status: DC
Start: 2023-02-22 — End: 2023-03-12

## 2023-02-22 NOTE — Progress Notes (Signed)
Subjective:    Patient ID: Kristin Ballard, female    DOB: [birth year], 37 y.o.   MRN: [id number]  Chief Complaint  Patient presents with   Medical Management of Chronic Issues   Pt presents to the office today for CPE and chronic follow up  She is followed by  Neurologists as needed for guillain-barre syndrome. She completed PT.    She was taking OC and doing well.    She is followed by Ortho for chronic back pain. She got steroid injections without relief. Referral to Pain management.   Reports having increased numbness and tingling in bilateral legs and feet. She takes gabapentin 300 mg TID and Cymbalta 30 mg.  Nicotine Dependence Presents for follow-up visit. Her urge triggers include company of smokers. She smokes < 1/2 a pack of cigarettes per day.  Gastroesophageal Reflux She complains of belching, heartburn and a hoarse voice. This is a chronic problem. The current episode started more than 1 year ago. The problem occurs occasionally. Risk factors include obesity. She has tried a PPI for the symptoms. The treatment provided moderate relief.  Back Pain This is a chronic problem. The current episode started more than 1 year ago. The problem occurs intermittently. The pain is present in the lumbar spine. The pain is at a severity of 7/10. The pain is moderate. The symptoms are aggravated by twisting and standing. Risk factors include obesity. She has tried home exercises and bed rest for the symptoms. The treatment provided no relief.  Anemia Presents for follow-up visit. Symptoms include malaise/fatigue.  Depression        This is a chronic problem.  The current episode started more than 1 year ago.   The problem occurs intermittently.  Associated symptoms include sad.  Associated symptoms include no helplessness and no hopelessness.  Past treatments include SNRIs - Serotonin and norepinephrine reuptake inhibitors. Constipation This is a chronic problem. The current episode started  more than 1 year ago. Her stool frequency is 1 time per day. Associated symptoms include back pain. Risk factors include obesity. She has tried diet changes for the symptoms. The treatment provided moderate relief.      Review of Systems  Constitutional:  Positive for malaise/fatigue.  HENT:  Positive for hoarse voice.   Gastrointestinal:  Positive for constipation and heartburn.  Musculoskeletal:  Positive for back pain.  Psychiatric/Behavioral:  Positive for depression.   All other systems reviewed and are negative.  Family History  Problem Relation Age of Onset   Depression Mother    Diabetes Father    Cancer Father        melanoma   Social History   Socioeconomic History   Marital status: Unknown    Spouse name: Not on file   Number of children: Not on file   Years of education: Not on file   Highest education level: Not on file  Occupational History   Not on file  Tobacco Use   Smoking status: Heavy Smoker    Current packs/day: 0.50    Types: Cigarettes   Smokeless tobacco: Never  Vaping Use   Vaping status: Never Used  Substance and Sexual Activity   Alcohol use: No   Drug use: Yes    Types: Marijuana   Sexual activity: Yes    Birth control/protection: Pill  Other Topics Concern   Not on file  Social History Narrative   Not on file   Social Determinants of Health   Financial Resource Strain:  Not on file  Food Insecurity: Not on file  Transportation Needs: No Transportation Needs (12/29/2021)   PRAPARE - Transportation    Lack of Transportation (Medical): No    Lack of Transportation (Non-Medical): No  Physical Activity: Not on file  Stress: Not on file  Social Connections: Not on file       Objective:   Physical Exam Vitals reviewed.  Constitutional:      General: She is not in acute distress.    Appearance: She is well-developed. She is obese.  HENT:     Head: Normocephalic and atraumatic.     Right Ear: Tympanic membrane normal.     Left  Ear: Tympanic membrane normal.  Eyes:     Pupils: Pupils are equal, round, and reactive to light.  Neck:     Thyroid: No thyromegaly.  Cardiovascular:     Rate and Rhythm: Normal rate and regular rhythm.     Heart sounds: Normal heart sounds. No murmur heard. Pulmonary:     Effort: Pulmonary effort is normal. No respiratory distress.     Breath sounds: Normal breath sounds. No wheezing.  Abdominal:     General: Bowel sounds are normal. There is no distension.     Palpations: Abdomen is soft.     Tenderness: There is no abdominal tenderness.  Musculoskeletal:        General: No tenderness. Normal range of motion.     Cervical back: Normal range of motion and neck supple.  Skin:    General: Skin is warm and dry.  Neurological:     Mental Status: She is alert and oriented to person, place, and time.     Cranial Nerves: No cranial nerve deficit.     Deep Tendon Reflexes: Reflexes are normal and symmetric.  Psychiatric:        Behavior: Behavior normal.        Thought Content: Thought content normal.        Judgment: Judgment normal.       BP (!) 140/95   Pulse (!) 113   Temp 97.7 F (36.5 C)   Ht 5\' 3"  (1.6 m)   Wt 298 lb (135.2 kg)   SpO2 96%   BMI 52.79 kg/m      Assessment & Plan:  WALTER GREIG comes in today with chief complaint of Medical Management of Chronic Issues   Diagnosis and orders addressed:  1. Annual physical exam - CMP14+EGFR - CBC with Differential/Platelet - Lipid panel - TSH  2. Current smoker - CMP14+EGFR - CBC with Differential/Platelet  3. Morbid obesity (HCC) - Semaglutide-Weight Management 0.25 MG/0.5ML SOAJ; Inject 0.25 mg into the skin once a week for 28 days.  Dispense: 2 mL; Refill: 0 - Semaglutide-Weight Management 0.5 MG/0.5ML SOAJ; Inject 0.5 mg into the skin once a week.  Dispense: 2 mL; Refill: 1 - CMP14+EGFR - CBC with Differential/Platelet  4. Gastroesophageal reflux disease, unspecified whether esophagitis  present - CMP14+EGFR - CBC with Differential/Platelet  5. Depression, major, single episode, mild (HCC) - CMP14+EGFR - CBC with Differential/Platelet - TSH  6. Neural foraminal stenosis of lumbar spine - CMP14+EGFR - CBC with Differential/Platelet  7. Peripheral polyneuropathy - CMP14+EGFR - CBC with Differential/Platelet  8. Chronic bilateral low back pain without sciatica - CMP14+EGFR - CBC with Differential/Platelet - TSH  9. Constipation, unspecified constipation type - CMP14+EGFR - CBC with Differential/Platelet  10. Iron deficiency anemia, unspecified iron deficiency anemia type - CMP14+EGFR - CBC with Differential/Platelet - Iron,  TIBC and Ferritin Panel  11. Vitamin B 12 deficiency - CMP14+EGFR - CBC with Differential/Platelet  12. Birth control counseling - CMP14+EGFR - CBC with Differential/Platelet  13. Vitamin D deficiency - CMP14+EGFR - CBC with Differential/Platelet - VITAMIN D 25 Hydroxy (Vit-D Deficiency, Fractures)  14. Weight loss counseling, encounter for Start Wegovy 0.25 mg for one month then increase 0.5 mg  Encourage healthy diet and exercise  Discussed possible side effects  - Semaglutide-Weight Management 0.25 MG/0.5ML SOAJ; Inject 0.25 mg into the skin once a week for 28 days.  Dispense: 2 mL; Refill: 0 - Semaglutide-Weight Management 0.5 MG/0.5ML SOAJ; Inject 0.5 mg into the skin once a week.  Dispense: 2 mL; Refill: 1 - CMP14+EGFR - CBC with Differential/Platelet - TSH   Labs pending Continue current medications  Health Maintenance reviewed Diet and exercise encouraged  Follow up plan: 2 month for weight loss   Jannifer Rodney, FNP

## 2023-02-22 NOTE — Patient Instructions (Signed)
Hypertension, Adult High blood pressure (hypertension) is when the force of blood pumping through the arteries is too strong. The arteries are the blood vessels that carry blood from the heart throughout the body. Hypertension forces the heart to work harder to pump blood and may cause arteries to become narrow or stiff. Untreated or uncontrolled hypertension can lead to a heart attack, heart failure, a stroke, kidney disease, and other problems. A blood pressure reading consists of a higher number over a lower number. Ideally, your blood pressure should be below 120/80. The first ("top") number is called the systolic pressure. It is a measure of the pressure in your arteries as your heart beats. The second ("bottom") number is called the diastolic pressure. It is a measure of the pressure in your arteries as the heart relaxes. What are the causes? The exact cause of this condition is not known. There are some conditions that result in high blood pressure. What increases the risk? Certain factors may make you more likely to develop high blood pressure. Some of these risk factors are under your control, including: Smoking. Not getting enough exercise or physical activity. Being overweight. Having too much fat, sugar, calories, or salt (sodium) in your diet. Drinking too much alcohol. Other risk factors include: Having a personal history of heart disease, diabetes, high cholesterol, or kidney disease. Stress. Having a family history of high blood pressure and high cholesterol. Having obstructive sleep apnea. Age. The risk increases with age. What are the signs or symptoms? High blood pressure may not cause symptoms. Very high blood pressure (hypertensive crisis) may cause: Headache. Fast or irregular heartbeats (palpitations). Shortness of breath. Nosebleed. Nausea and vomiting. Vision changes. Severe chest pain, dizziness, and seizures. How is this diagnosed? This condition is diagnosed by  measuring your blood pressure while you are seated, with your arm resting on a flat surface, your legs uncrossed, and your feet flat on the floor. The cuff of the blood pressure monitor will be placed directly against the skin of your upper arm at the level of your heart. Blood pressure should be measured at least twice using the same arm. Certain conditions can cause a difference in blood pressure between your right and left arms. If you have a high blood pressure reading during one visit or you have normal blood pressure with other risk factors, you may be asked to: Return on a different day to have your blood pressure checked again. Monitor your blood pressure at home for 1 week or longer. If you are diagnosed with hypertension, you may have other blood or imaging tests to help your health care provider understand your overall risk for other conditions. How is this treated? This condition is treated by making healthy lifestyle changes, such as eating healthy foods, exercising more, and reducing your alcohol intake. You may be referred for counseling on a healthy diet and physical activity. Your health care provider may prescribe medicine if lifestyle changes are not enough to get your blood pressure under control and if: Your systolic blood pressure is above 130. Your diastolic blood pressure is above 80. Your personal target blood pressure may vary depending on your medical conditions, your age, and other factors. Follow these instructions at home: Eating and drinking  Eat a diet that is high in fiber and potassium, and low in sodium, added sugar, and fat. An example of this eating plan is called the DASH diet. DASH stands for Dietary Approaches to Stop Hypertension. To eat this way: Eat   plenty of fresh fruits and vegetables. Try to fill one half of your plate at each meal with fruits and vegetables. Eat whole grains, such as whole-wheat pasta, brown rice, or whole-grain bread. Fill about one  fourth of your plate with whole grains. Eat or drink low-fat dairy products, such as skim milk or low-fat yogurt. Avoid fatty cuts of meat, processed or cured meats, and poultry with skin. Fill about one fourth of your plate with lean proteins, such as fish, chicken without skin, beans, eggs, or tofu. Avoid pre-made and processed foods. These tend to be higher in sodium, added sugar, and fat. Reduce your daily sodium intake. Many people with hypertension should eat less than 1,500 mg of sodium a day. Do not drink alcohol if: Your health care provider tells you not to drink. You are pregnant, may be pregnant, or are planning to become pregnant. If you drink alcohol: Limit how much you have to: 0-1 drink a day for women. 0-2 drinks a day for men. Know how much alcohol is in your drink. In the [place] one drink equals one 12 oz bottle of beer (355 mL), one 5 oz glass of wine (148 mL), or one 1 oz glass of hard liquor (44 mL). Lifestyle  Work with your health care provider to maintain a healthy body weight or to lose weight. Ask what an ideal weight is for you. Get at least 30 minutes of exercise that causes your heart to beat faster (aerobic exercise) most days of the week. Activities may include walking, swimming, or biking. Include exercise to strengthen your muscles (resistance exercise), such as Pilates or lifting weights, as part of your weekly exercise routine. Try to do these types of exercises for 30 minutes at least 3 days a week. Do not use any products that contain nicotine or tobacco. These products include cigarettes, chewing tobacco, and vaping devices, such as e-cigarettes. If you need help quitting, ask your health care provider. Monitor your blood pressure at home as told by your health care provider. Keep all follow-up visits. This is important. Medicines Take over-the-counter and prescription medicines only as told by your health care provider. Follow directions carefully. Blood  pressure medicines must be taken as prescribed. Do not skip doses of blood pressure medicine. Doing this puts you at risk for problems and can make the medicine less effective. Ask your health care provider about side effects or reactions to medicines that you should watch for. Contact a health care provider if you: Think you are having a reaction to a medicine you are taking. Have headaches that keep coming back (recurring). Feel dizzy. Have swelling in your ankles. Have trouble with your vision. Get help right away if you: Develop a severe headache or confusion. Have unusual weakness or numbness. Feel faint. Have severe pain in your chest or abdomen. Vomit repeatedly. Have trouble breathing. These symptoms may be an emergency. Get help right away. Call 911. Do not wait to see if the symptoms will go away. Do not drive yourself to the hospital. Summary Hypertension is when the force of blood pumping through your arteries is too strong. If this condition is not controlled, it may put you at risk for serious complications. Your personal target blood pressure may vary depending on your medical conditions, your age, and other factors. For most people, a normal blood pressure is less than 120/80. Hypertension is treated with lifestyle changes, medicines, or a combination of both. Lifestyle changes include losing weight, eating a healthy,   low-sodium diet, exercising more, and limiting alcohol. This information is not intended to replace advice given to you by your health care provider. Make sure you discuss any questions you have with your health care provider. Document Revised: 01/31/2021 Document Reviewed: 01/31/2021 Elsevier Patient Education  2024 Elsevier Inc.  

## 2023-02-23 LAB — CMP14+EGFR
ALT: 10 [IU]/L (ref 0–32)
AST: 20 [IU]/L (ref 0–40)
Albumin: 4.2 g/dL (ref 3.9–4.9)
Alkaline Phosphatase: 74 [IU]/L (ref 44–121)
BUN/Creatinine Ratio: 15 (ref 9–23)
BUN: 14 mg/dL (ref 6–20)
Bilirubin Total: <0.2 mg/dL (ref 0.0–1.2)
CO2: 21 mmol/L (ref 20–29)
Calcium: 9.3 mg/dL (ref 8.7–10.2)
Chloride: 101 mmol/L (ref 96–106)
Creatinine, Ser: 0.96 mg/dL (ref 0.57–1.00)
Globulin, Total: 2.9 g/dL (ref 1.5–4.5)
Glucose: 109 mg/dL — ABNORMAL HIGH (ref 70–99)
Potassium: 5 mmol/L (ref 3.5–5.2)
Sodium: 137 mmol/L (ref 134–144)
Total Protein: 7.1 g/dL (ref 6.0–8.5)
eGFR: 77 mL/min/{1.73_m2} (ref >59)

## 2023-02-23 LAB — LIPID PANEL
Chol/HDL Ratio: 4.1 ratio (ref 0.0–4.4)
Cholesterol, Total: 281 mg/dL — ABNORMAL HIGH (ref 100–199)
HDL: 68 mg/dL (ref >39)
LDL Chol Calc (NIH): 148 mg/dL — ABNORMAL HIGH (ref 0–99)
Triglycerides: 355 mg/dL — ABNORMAL HIGH (ref 0–149)
VLDL Cholesterol Cal: 65 mg/dL — ABNORMAL HIGH (ref 5–40)

## 2023-02-23 LAB — CBC WITH DIFFERENTIAL/PLATELET
Basophils Absolute: 0 10*3/uL (ref 0.0–0.2)
Basos: 1 %
EOS (ABSOLUTE): 0.1 10*3/uL (ref 0.0–0.4)
Eos: 1 %
Hematocrit: 36.5 % (ref 34.0–46.6)
Hemoglobin: 11.8 g/dL (ref 11.1–15.9)
Immature Grans (Abs): 0 10*3/uL (ref 0.0–0.1)
Immature Granulocytes: 0 %
Lymphocytes Absolute: 2.7 10*3/uL (ref 0.7–3.1)
Lymphs: 39 %
MCH: 29.4 pg (ref 26.6–33.0)
MCHC: 32.3 g/dL (ref 31.5–35.7)
MCV: 91 fL (ref 79–97)
Monocytes Absolute: 0.5 10*3/uL (ref 0.1–0.9)
Monocytes: 7 %
Neutrophils Absolute: 3.6 10*3/uL (ref 1.4–7.0)
Neutrophils: 52 %
Platelets: 364 10*3/uL (ref 150–450)
RBC: 4.01 x10E6/uL (ref 3.77–5.28)
RDW: 14.9 % (ref 11.7–15.4)
WBC: 7 10*3/uL (ref 3.4–10.8)

## 2023-02-23 LAB — IRON,TIBC AND FERRITIN PANEL
Ferritin: 27 ng/mL (ref 15–150)
Iron Saturation: 6 % — CL (ref 15–55)
Iron: 36 ug/dL (ref 27–159)
Total Iron Binding Capacity: 569 ug/dL (ref 250–450)
UIBC: 533 ug/dL — ABNORMAL HIGH (ref 131–425)

## 2023-02-23 LAB — VITAMIN D 25 HYDROXY (VIT D DEFICIENCY, FRACTURES): Vit D, 25-Hydroxy: 34.5 ng/mL (ref 30.0–100.0)

## 2023-02-23 LAB — TSH: TSH: 1.39 u[IU]/mL (ref 0.450–4.500)

## 2023-03-03 ENCOUNTER — Other Ambulatory Visit: Payer: Self-pay | Admitting: Family

## 2023-03-03 DIAGNOSIS — G8929 Other chronic pain: Secondary | ICD-10-CM

## 2023-03-05 ENCOUNTER — Other Ambulatory Visit: Payer: Self-pay | Admitting: Family

## 2023-03-12 ENCOUNTER — Telehealth: Payer: Self-pay | Admitting: Pharmacist

## 2023-03-12 MED ORDER — SEMAGLUTIDE-WEIGHT MANAGEMENT 1.7 MG/0.75ML ~~LOC~~ SOAJ: 1.7000 mg | SUBCUTANEOUS | 0 refills | Status: DC

## 2023-03-12 MED ORDER — SEMAGLUTIDE-WEIGHT MANAGEMENT 0.25 MG/0.5ML ~~LOC~~ SOAJ: 0.2500 mg | SUBCUTANEOUS | 0 refills | Status: AC

## 2023-03-12 MED ORDER — SEMAGLUTIDE-WEIGHT MANAGEMENT 1 MG/0.5ML ~~LOC~~ SOAJ: 1.0000 mg | SUBCUTANEOUS | 0 refills | Status: DC

## 2023-03-12 MED ORDER — SEMAGLUTIDE-WEIGHT MANAGEMENT 0.5 MG/0.5ML ~~LOC~~ SOAJ: 0.5000 mg | SUBCUTANEOUS | 0 refills | Status: AC

## 2023-03-12 MED ORDER — SEMAGLUTIDE-WEIGHT MANAGEMENT 2.4 MG/0.75ML ~~LOC~~ SOAJ: 2.4000 mg | SUBCUTANEOUS | 0 refills | Status: DC

## 2023-03-12 NOTE — Telephone Encounter (Signed)
Pt has been notified.

## 2023-03-12 NOTE — Telephone Encounter (Signed)
Ozempic is approved exclusively as an adjunct to diet and exercise to improve glycemic control in adults with type 2 diabetes mellitus. A review of Kristin Ballard's medical chart reveals no documented current diagnosis of type 2 diabetes or an elevated A1C. Therefore, she does not currently meet the criteria for prior authorization of this medication. If clinically appropriate, alternative options such as Kristin Ballard, or Kristin Ballard may be considered for this patient.   Dellie Burns, PharmD Clinical Pharmacist  [place]  Direct Dial: [phone]

## 2023-03-12 NOTE — Addendum Note (Signed)
Addended by: Neale Burly on: 03/12/2023 12:52 PM   Modules accepted: Orders

## 2023-03-15 ENCOUNTER — Telehealth: Payer: Self-pay

## 2023-03-15 NOTE — Telephone Encounter (Signed)
Kristin Ballard ([employer]) Rx #: [id number] 0.5MG /0.5ML auto-injectors Form [facility] [employer] Electronic [place] Form [phone] NCPDP) Created 3 days ago Sent to Plan 5 minutes ago Plan Response 4 minutes ago Submit Clinical Questions less than a minute ago Determination Wait for Determination Please wait for [employer] [facility] to return a determination.

## 2023-03-18 ENCOUNTER — Ambulatory Visit (INDEPENDENT_AMBULATORY_CARE_PROVIDER_SITE_OTHER): Payer: Medicaid Other

## 2023-03-18 DIAGNOSIS — E538 Deficiency of other specified B group vitamins: Secondary | ICD-10-CM | POA: Diagnosis not present

## 2023-03-18 NOTE — Telephone Encounter (Signed)
Pharmacy Patient Advocate Encounter  Received notification from [facility] that Prior Authorization for Wegovy 0.5MG /0.5ML auto-injectors has been APPROVED from 03/15/23 to 09/11/23   PA #/Case ID/Reference #: [id number]

## 2023-03-18 NOTE — Progress Notes (Signed)
Cyanocobalamin injection given to left deltoid.  Patient tolerated well. 

## 2023-03-20 ENCOUNTER — Other Ambulatory Visit: Payer: Self-pay | Admitting: Family

## 2023-03-20 DIAGNOSIS — Z3009 Encounter for other general counseling and advice on contraception: Secondary | ICD-10-CM

## 2023-04-19 ENCOUNTER — Ambulatory Visit: Payer: Medicaid Other

## 2023-04-25 ENCOUNTER — Ambulatory Visit: Payer: Medicaid Other | Admitting: Family

## 2023-04-25 ENCOUNTER — Other Ambulatory Visit: Payer: Self-pay | Admitting: Family

## 2023-04-28 ENCOUNTER — Other Ambulatory Visit: Payer: Self-pay | Admitting: Family

## 2023-04-28 DIAGNOSIS — E559 Vitamin D deficiency, unspecified: Secondary | ICD-10-CM

## 2023-04-30 ENCOUNTER — Ambulatory Visit (INDEPENDENT_AMBULATORY_CARE_PROVIDER_SITE_OTHER): Payer: Medicaid Other | Admitting: *Deleted

## 2023-04-30 DIAGNOSIS — E538 Deficiency of other specified B group vitamins: Secondary | ICD-10-CM | POA: Diagnosis not present

## 2023-04-30 MED ORDER — CYANOCOBALAMIN 1000 MCG/ML IJ SOLN
1000.0000 ug | Freq: Once | INTRAMUSCULAR | Status: AC
  Administered 2023-04-30: 1000 ug via INTRAMUSCULAR

## 2023-04-30 NOTE — Progress Notes (Signed)
 Patient in today for monthly B 12 injection. Tolerated well.

## 2023-05-02 ENCOUNTER — Other Ambulatory Visit: Payer: Self-pay | Admitting: Family

## 2023-05-02 DIAGNOSIS — K219 Gastro-esophageal reflux disease without esophagitis: Secondary | ICD-10-CM

## 2023-05-03 ENCOUNTER — Other Ambulatory Visit: Payer: Self-pay | Admitting: Family Medicine

## 2023-05-12 ENCOUNTER — Other Ambulatory Visit: Payer: Self-pay | Admitting: Family

## 2023-05-17 ENCOUNTER — Encounter: Payer: Self-pay | Admitting: Family

## 2023-05-17 ENCOUNTER — Ambulatory Visit (INDEPENDENT_AMBULATORY_CARE_PROVIDER_SITE_OTHER): Payer: Medicaid Other | Admitting: Family

## 2023-05-17 DIAGNOSIS — M545 Low back pain, unspecified: Secondary | ICD-10-CM | POA: Diagnosis not present

## 2023-05-17 DIAGNOSIS — F17200 Nicotine dependence, unspecified, uncomplicated: Secondary | ICD-10-CM | POA: Diagnosis not present

## 2023-05-17 DIAGNOSIS — F320 Major depressive disorder, single episode, mild: Secondary | ICD-10-CM

## 2023-05-17 DIAGNOSIS — K219 Gastro-esophageal reflux disease without esophagitis: Secondary | ICD-10-CM

## 2023-05-17 DIAGNOSIS — Z713 Dietary counseling and surveillance: Secondary | ICD-10-CM

## 2023-05-17 DIAGNOSIS — K5900 Constipation, unspecified: Secondary | ICD-10-CM

## 2023-05-17 DIAGNOSIS — G629 Polyneuropathy, unspecified: Secondary | ICD-10-CM

## 2023-05-17 DIAGNOSIS — G8929 Other chronic pain: Secondary | ICD-10-CM

## 2023-05-17 DIAGNOSIS — D509 Iron deficiency anemia, unspecified: Secondary | ICD-10-CM

## 2023-05-17 MED ORDER — GABAPENTIN 400 MG PO CAPS: 400.0000 mg | ORAL_CAPSULE | Freq: Three times a day (TID) | ORAL | 2 refills | Status: DC | PRN

## 2023-05-17 MED ORDER — SEMAGLUTIDE-WEIGHT MANAGEMENT 1 MG/0.5ML ~~LOC~~ SOAJ: 1.0000 mg | SUBCUTANEOUS | 0 refills | Status: AC

## 2023-05-17 NOTE — Progress Notes (Signed)
 Subjective:    Patient ID: Kristin Ballard, female    DOB: December 14, 1985, 38 y.o.   MRN: [id number]  Chief Complaint  Patient presents with   Weight Check   Medical Management of Chronic Issues   Pt presents to the office today for chronic follow up  She is followed by  Neurologists as needed for guillain-barre syndrome. She completed PT.    She was taking OC and doing well.    She is followed by Ortho for chronic back pain. She got steroid injections without relief. Followed by Pain Management monthly.   Reports having increased numbness and tingling in bilateral legs and feet. She takes gabapentin  300 mg TID and Cymbalta  60 mg.  She stated  Wegovy  0.5 mg. She has lost 1 lb.      05/17/2023    3:21 PM 02/22/2023   11:58 AM 12/24/2022    2:57 PM  Last 3 Weights  Weight (lbs) 297 lb 298 lb 285 lb  Weight (kg) 134.718 kg 135.172 kg 129.275 kg     Nicotine Dependence Presents for follow-up visit. She smokes < 1/2 a pack of cigarettes per day.  Gastroesophageal Reflux She complains of belching, heartburn and a hoarse voice. This is a chronic problem. The current episode started more than 1 year ago. The problem occurs occasionally. Risk factors include obesity. She has tried a PPI for the symptoms. The treatment provided moderate relief.  Back Pain This is a chronic problem. The current episode started more than 1 year ago. The problem occurs intermittently. The pain is present in the lumbar spine. The pain is at a severity of 7/10. The pain is moderate. The symptoms are aggravated by twisting and standing. Risk factors include obesity. She has tried home exercises and bed rest for the symptoms. The treatment provided no relief.  Anemia Presents for follow-up visit. Symptoms include malaise/fatigue.  Depression        This is a chronic problem.  The current episode started more than 1 year ago.   The problem occurs intermittently.  Associated symptoms include sad.  Associated symptoms  include no helplessness and no hopelessness.  Past treatments include SNRIs - Serotonin and norepinephrine reuptake inhibitors. Constipation This is a chronic problem. The current episode started more than 1 year ago. Her stool frequency is 1 time per day. Associated symptoms include back pain. Risk factors include obesity. She has tried diet changes for the symptoms. The treatment provided moderate relief.      Review of Systems  Constitutional:  Positive for malaise/fatigue.  HENT:  Positive for hoarse voice.   Gastrointestinal:  Positive for constipation and heartburn.  Musculoskeletal:  Positive for back pain.  All other systems reviewed and are negative.  Family History  Problem Relation Age of Onset   Depression Mother    Diabetes Father    Cancer Father        melanoma   Social History   Socioeconomic History   Marital status: Unknown    Spouse name: Not on file   Number of children: Not on file   Years of education: Not on file   Highest education level: Not on file  Occupational History   Not on file  Tobacco Use   Smoking status: Heavy Smoker    Current packs/day: 0.50    Types: Cigarettes   Smokeless tobacco: Never  Vaping Use   Vaping status: Never Used  Substance and Sexual Activity   Alcohol use: No  Drug use: Yes    Types: Marijuana   Sexual activity: Yes    Birth control/protection: Pill  Other Topics Concern   Not on file  Social History Narrative   Not on file   Social Drivers of Health   Financial Resource Strain: Not on file  Food Insecurity: Not on file  Transportation Needs: No Transportation Needs (12/29/2021)   PRAPARE - Transportation    Lack of Transportation (Medical): No    Lack of Transportation (Non-Medical): No  Physical Activity: Not on file  Stress: Not on file  Social Connections: Not on file       Objective:   Physical Exam Vitals reviewed.  Constitutional:      General: She is not in acute distress.    Appearance:  She is well-developed. She is obese.  HENT:     Head: Normocephalic and atraumatic.     Right Ear: Tympanic membrane normal.     Left Ear: Tympanic membrane normal.  Eyes:     Pupils: Pupils are equal, round, and reactive to light.  Neck:     Thyroid : No thyromegaly.  Cardiovascular:     Rate and Rhythm: Normal rate and regular rhythm.     Heart sounds: Normal heart sounds. No murmur heard. Pulmonary:     Effort: Pulmonary effort is normal. No respiratory distress.     Breath sounds: Normal breath sounds. No wheezing.  Abdominal:     General: Bowel sounds are normal. There is no distension.     Palpations: Abdomen is soft.     Tenderness: There is no abdominal tenderness.  Musculoskeletal:        General: No tenderness. Normal range of motion.     Cervical back: Normal range of motion and neck supple.  Skin:    General: Skin is warm and dry.  Neurological:     Mental Status: She is alert and oriented to person, place, and time.     Cranial Nerves: No cranial nerve deficit.     Deep Tendon Reflexes: Reflexes are normal and symmetric.  Psychiatric:        Behavior: Behavior normal.        Thought Content: Thought content normal.        Judgment: Judgment normal.       BP (!) 148/99   Pulse 91   Temp 98.5 F (36.9 C)   Ht 5' 3 (1.6 m)   Wt 297 lb (134.7 kg)   LMP 04/22/2023   SpO2 96%   BMI 52.61 kg/m      Assessment & Plan:  Kristin Ballard comes in today with chief complaint of Weight Check and Medical Management of Chronic Issues   Diagnosis and orders addressed:  1. Morbid obesity (HCC) (Primary) Will increase Wegovy  to 1 mg from 0.5 mg  Encourage healthy diet and exercise  - Semaglutide -Weight Management 1 MG/0.5ML SOAJ; Inject 1 mg into the skin once a week for 28 days.  Dispense: 2 mL; Refill: 0  2. Chronic bilateral low back pain without sciatica  3. Constipation, unspecified constipation type  4. Current smoker  5. Depression, major, single  episode, mild (HCC)  6. Gastroesophageal reflux disease, unspecified whether esophagitis present  7. Iron deficiency anemia, unspecified iron deficiency anemia type  8. Peripheral polyneuropathy Will increase Gabapentin  to 400 mg TID from 300 mg TID - gabapentin  (NEURONTIN ) 400 MG capsule; Take 1 capsule (400 mg total) by mouth 3 (three) times daily as needed.  Dispense: 270  capsule; Refill: 2  9. Weight loss counseling, encounter for - Semaglutide -Weight Management 1 MG/0.5ML SOAJ; Inject 1 mg into the skin once a week for 28 days.  Dispense: 2 mL; Refill: 0   Labs pending Will increase Wegovy  to 1 mg from 0.5 mg  Will increase gabapentin  to 400 mg TID from 300 mg TID  Continue current medications  Health Maintenance reviewed Diet and exercise encouraged  Follow up plan: 1 month for weight loss   Bari Learn, FNP

## 2023-05-17 NOTE — Patient Instructions (Signed)

## 2023-05-28 ENCOUNTER — Ambulatory Visit: Payer: Medicaid Other

## 2023-05-28 ENCOUNTER — Other Ambulatory Visit: Payer: Self-pay | Admitting: Family

## 2023-06-04 ENCOUNTER — Other Ambulatory Visit: Payer: Self-pay | Admitting: Family

## 2023-06-04 DIAGNOSIS — F320 Major depressive disorder, single episode, mild: Secondary | ICD-10-CM

## 2023-06-10 ENCOUNTER — Ambulatory Visit (INDEPENDENT_AMBULATORY_CARE_PROVIDER_SITE_OTHER): Payer: Medicaid Other

## 2023-06-10 DIAGNOSIS — E538 Deficiency of other specified B group vitamins: Secondary | ICD-10-CM | POA: Diagnosis not present

## 2023-06-10 MED ORDER — CYANOCOBALAMIN 1000 MCG/ML IJ SOLN
1000.0000 ug | Freq: Once | INTRAMUSCULAR | Status: AC
  Administered 2023-06-10: 1000 ug via INTRAMUSCULAR

## 2023-06-10 NOTE — Progress Notes (Signed)
 Patient is in office today for a nurse visit for B12 Injection. Patient Injection was given in the  Right deltoid. Patient tolerated injection well.

## 2023-06-14 ENCOUNTER — Other Ambulatory Visit: Payer: Self-pay | Admitting: Family

## 2023-06-14 DIAGNOSIS — G8929 Other chronic pain: Secondary | ICD-10-CM

## 2023-06-21 ENCOUNTER — Encounter: Payer: Self-pay | Admitting: Family

## 2023-06-21 ENCOUNTER — Ambulatory Visit (INDEPENDENT_AMBULATORY_CARE_PROVIDER_SITE_OTHER): Payer: Medicaid Other | Admitting: Family

## 2023-06-21 VITALS — BP 138/92 | HR 97 | Temp 97.0°F | Ht 63.0 in | Wt 294.0 lb

## 2023-06-21 DIAGNOSIS — E876 Hypokalemia: Secondary | ICD-10-CM | POA: Diagnosis not present

## 2023-06-21 DIAGNOSIS — Z09 Encounter for follow-up examination after completed treatment for conditions other than malignant neoplasm: Secondary | ICD-10-CM

## 2023-06-21 DIAGNOSIS — R030 Elevated blood-pressure reading, without diagnosis of hypertension: Secondary | ICD-10-CM

## 2023-06-21 DIAGNOSIS — E860 Dehydration: Secondary | ICD-10-CM

## 2023-06-21 MED ORDER — BLOOD PRESSURE KIT DEVI: 1.0000 | Freq: Every day | 0 refills | Status: AC | PRN

## 2023-06-21 NOTE — Progress Notes (Signed)
 Subjective:    Patient ID: Kristin Ballard, female    DOB: [birth year], 38 y.o.   MRN: [id number]  Chief Complaint  Patient presents with   Weight Check   PT presents to the office today to follow up on weight loss. She was seen on 05/17/23 and we increased her Wegovy to 1 mg from 0.5 mg . Reports she had to go to the ED for nausea, vomiting, hypokalemia, and dehydration. She has been taking potassium chloride 20 meq daily.  She stopped the [facility].       06/21/2023   11:12 AM 05/17/2023    3:21 PM 02/22/2023   11:58 AM  Last 3 Weights  Weight (lbs) 294 lb 297 lb 298 lb  Weight (kg) 133.358 kg 134.718 kg 135.172 kg     Hypertension This is a new problem. The current episode started 1 to 4 weeks ago. The problem has been waxing and waning since onset. The problem is uncontrolled.      Review of Systems  All other systems reviewed and are negative.   Social History   Socioeconomic History   Marital status: Unknown    Spouse name: Not on file   Number of children: Not on file   Years of education: Not on file   Highest education level: Not on file  Occupational History   Not on file  Tobacco Use   Smoking status: Heavy Smoker    Current packs/day: 0.50    Types: Cigarettes   Smokeless tobacco: Never  Vaping Use   Vaping status: Never Used  Substance and Sexual Activity   Alcohol use: No   Drug use: Yes    Types: Marijuana   Sexual activity: Yes    Birth control/protection: Pill  Other Topics Concern   Not on file  Social History Narrative   Not on file   Social Drivers of Health   Financial Resource Strain: Low Risk  (05/29/2023)   Received from [facility]   Overall Financial Resource Strain (CARDIA)    Difficulty of Paying Living Expenses: Not very hard  Food Insecurity: No Food Insecurity (05/29/2023)   Received from [facility]   Hunger Vital Sign    Worried About Running Out of Food in the Last Year: Never true    Ran Out of Food in the Last  Year: Never true  Transportation Needs: No Transportation Needs (05/29/2023)   Received from [facility]   PRAPARE - Transportation    Lack of Transportation (Medical): No    Lack of Transportation (Non-Medical): No  Physical Activity: Not on file  Stress: Not on file  Social Connections: Not on file   Family History  Problem Relation Age of Onset   Depression Mother    Diabetes Father    Cancer Father        melanoma        Objective:   Physical Exam Vitals reviewed.  Constitutional:      General: She is not in acute distress.    Appearance: She is well-developed. She is obese.  HENT:     Head: Normocephalic and atraumatic.  Eyes:     Pupils: Pupils are equal, round, and reactive to light.  Neck:     Thyroid: No thyromegaly.  Cardiovascular:     Rate and Rhythm: Normal rate and regular rhythm.     Heart sounds: Normal heart sounds. No murmur heard. Pulmonary:     Effort: Pulmonary effort is normal.  No respiratory distress.     Breath sounds: Normal breath sounds. No wheezing.  Abdominal:     General: Bowel sounds are normal. There is no distension.     Palpations: Abdomen is soft.     Tenderness: There is no abdominal tenderness.  Musculoskeletal:        General: No tenderness. Normal range of motion.     Cervical back: Normal range of motion and neck supple.  Skin:    General: Skin is warm and dry.  Neurological:     Mental Status: She is alert and oriented to person, place, and time.     Cranial Nerves: No cranial nerve deficit.     Deep Tendon Reflexes: Reflexes are normal and symmetric.  Psychiatric:        Behavior: Behavior normal.        Thought Content: Thought content normal.        Judgment: Judgment normal.       BP (!) 138/92   Pulse 97   Temp (!) 97 F (36.1 C) (Temporal)   Ht 5\' 3"  (1.6 m)   Wt 294 lb (133.4 kg)   SpO2 97%   BMI 52.08 kg/m      Assessment & Plan:  JUANITA STREIGHT comes in today with chief complaint of Weight  Check   Diagnosis and orders addressed:  1. Hospital discharge follow-up - BMP8+EGFR  2. Hypokalemia (Primary) Labs pending  Continue oral potassium and potassium rich diet - BMP8+EGFR  3. Dehydration Resolved  - BMP8+EGFR  4. Elevated blood pressure reading Monitor BP at home  Want <140/90 -Daily blood pressure log given with instructions on how to fill out and told to bring to next visit -Dash diet information given -Exercise encouraged - Stress Management  - Blood Pressure Monitoring (BLOOD PRESSURE KIT) DEVI; 1 Device by Does not apply route daily as needed.  Dispense: 1 each; Refill: 0   Labs pending Continue current medications  Keep follow up with specialists  If BP >140/90 on next visit will need to start medications   Return if symptoms worsen or fail to improve.    Jannifer Rodney, FNP

## 2023-06-21 NOTE — Patient Instructions (Signed)
 Hypokalemia Hypokalemia means that the amount of potassium in the blood is lower than normal. Potassium is a mineral (electrolyte) that helps regulate the amount of fluid in the body. It also stimulates muscle tightening (contraction) and helps nerves work properly. Normally, most of the body's potassium is inside cells, and only a very small amount is in the blood. Because the amount in the blood is so small, minor changes to potassium levels in the blood can be life-threatening. What are the causes? This condition may be caused by: Antibiotic medicine. Diarrhea or vomiting. Taking too much of a medicine that helps you have a bowel movement (laxative) can cause diarrhea and lead to hypokalemia. Chronic kidney disease (CKD). Medicines that help the body get rid of excess fluid (diuretics). Eating disorders, such as anorexia or bulimia. Low magnesium levels in the body. Sweating a lot. What are the signs or symptoms? Symptoms of this condition include: Weakness. Constipation. Fatigue. Muscle cramps. Mental confusion. Skipped heartbeats or irregular heartbeat (palpitations). Tingling or numbness. How is this diagnosed? This condition is diagnosed with a blood test. How is this treated? This condition may be treated by: Taking potassium supplements. Adjusting the medicines that you take. Eating more foods that contain a lot of potassium. If your potassium level is very low, you may need to get potassium through an IV and be monitored in the hospital. Follow these instructions at home: Eating and drinking  Eat a healthy diet. A healthy diet includes fresh fruits and vegetables, whole grains, healthy fats, and lean proteins. If told, eat more foods that contain a lot of potassium. These include: Nuts, such as peanuts and pistachios. Seeds, such as sunflower seeds and pumpkin seeds. Peas, lentils, and lima beans. Whole grain and bran cereals and breads. Fresh fruits and vegetables,  such as apricots, avocado, bananas, cantaloupe, kiwi, oranges, tomatoes, asparagus, and potatoes. Juices, such as orange, tomato, and prune. Lean meats, including fish. Milk and milk products, such as yogurt. General instructions Take over-the-counter and prescription medicines only as told by your health care provider. This includes vitamins, natural food products, and supplements. Keep all follow-up visits. This is important. Contact a health care provider if: You have weakness that gets worse. You feel your heart pounding or racing. You vomit. You have diarrhea. You have diabetes and you have trouble keeping your blood sugar in your target range. Get help right away if: You have chest pain. You have shortness of breath. You have vomiting or diarrhea that lasts for more than 2 days. You faint. These symptoms may be an emergency. Get help right away. Call 911. Do not wait to see if the symptoms will go away. Do not drive yourself to the hospital. Summary Hypokalemia means that the amount of potassium in the blood is lower than normal. This condition is diagnosed with a blood test. Hypokalemia may be treated by taking potassium supplements, adjusting the medicines that you take, or eating more foods that are high in potassium. If your potassium level is very low, you may need to get potassium through an IV and be monitored in the hospital. This information is not intended to replace advice given to you by your health care provider. Make sure you discuss any questions you have with your health care provider. Document Revised: 12/08/2020 Document Reviewed: 12/08/2020 Elsevier Patient Education  2024 [employer].

## 2023-06-22 LAB — BMP8+EGFR
BUN/Creatinine Ratio: 5 — ABNORMAL LOW (ref 9–23)
BUN: 5 mg/dL — ABNORMAL LOW (ref 6–24)
CO2: 25 mmol/L (ref 20–29)
Calcium: 9 mg/dL (ref 8.7–10.2)
Chloride: 99 mmol/L (ref 96–106)
Creatinine, Ser: 1 mg/dL (ref 0.57–1.00)
Glucose: 104 mg/dL — ABNORMAL HIGH (ref 70–99)
Potassium: 5.2 mmol/L (ref 3.5–5.2)
Sodium: 140 mmol/L (ref 134–144)
eGFR: 73 mL/min/{1.73_m2} (ref >59)

## 2023-06-28 ENCOUNTER — Other Ambulatory Visit: Payer: Self-pay | Admitting: Family

## 2023-07-03 ENCOUNTER — Ambulatory Visit: Payer: Self-pay

## 2023-07-03 NOTE — Telephone Encounter (Signed)
 Copied from CRM [id number]. Topic: Clinical - Red Word Triage >> Jul 03, 2023  3:53 PM Marlow Baars wrote: Red Word that prompted transfer to Nurse Triage: The patient called in stating she has had shortness of breath, congestion, deep congestion and tightness in her chest. I will transfer her to [facility] as she has been using her allergy medication to no relief.   Chief Complaint: Cough with Dyspnea Symptoms: Cough, Congestion,  Frequency: Acute, One week Pertinent Negatives: Patient denies fever, chest pain Disposition: [] ED /[] Urgent Care (no appt availability in office) / [] Appointment(In office/virtual)/ []  [facility]/ [] Home Care/ [] Refused Recommended Disposition /[] [facility]/ []  Follow-up with PCP Additional Notes: SP is being triaged for coughing, congestion, chest tightness, and dyspnea. The patient denies a fever. States dyspnea is worse with exertion, recommended urgent care due to no inoffice availability today. Patient stated she would opt for an afternoon appointment tomorrow. Reinforeced education regarding seeking immediate care for the slightest worsening of symptoms, patient verbalized understanding.    Reason for Disposition  [1] MILD difficulty breathing (e.g., minimal/no SOB at rest, SOB with walking, pulse <100) AND [2] still present when not coughing  Answer Assessment - Initial Assessment Questions 1. ONSET: "When did the cough begin?"      Since Last Tuesday  2. SEVERITY: "How bad is the cough today?"      Coughing  3. SPUTUM: "Describe the color of your sputum" (none, dry cough; clear, white, yellow, green)     Yellow -Green  4. HEMOPTYSIS: "Are you coughing up any blood?" If so ask: "How much?" (flecks, streaks, tablespoons, etc.)     No  5. DIFFICULTY BREATHING: "Are you having difficulty breathing?" If Yes, ask: "How bad is it?" (e.g., mild, moderate, severe)    - MILD: No SOB at rest, mild SOB with walking, speaks normally in sentences,  can lie down, no retractions, pulse < 100.    - MODERATE: SOB at rest, SOB with minimal exertion and prefers to sit, cannot lie down flat, speaks in phrases, mild retractions, audible wheezing, pulse 100-120.    - SEVERE: Very SOB at rest, speaks in single words, struggling to breathe, sitting hunched forward, retractions, pulse > 120      Moderate  6. FEVER: "Do you have a fever?" If Yes, ask: "What is your temperature, how was it measured, and when did it start?"     No  7. CARDIAC HISTORY: "Do you have any history of heart disease?" (e.g., heart attack, congestive heart failure)      No  8. LUNG HISTORY: "Do you have any history of lung disease?"  (e.g., pulmonary embolus, asthma, emphysema)     Asthma-Pediatric, Bronchitis  9. PE RISK FACTORS: "Do you have a history of blood clots?" (or: recent major surgery, recent prolonged travel, bedridden)     No  10. OTHER SYMPTOMS: "Do you have any other symptoms?" (e.g., runny nose, wheezing, chest pain)       Chest Tightness, Cough, Dyspnea, Headache, Sore Throat  11. PREGNANCY: "Is there any chance you are pregnant?" "When was your last menstrual period?"       LMP 3 weeks ago  12. TRAVEL: "Have you traveled out of the country in the last month?" (e.g., travel history, exposures)       No  Protocols used: Cough - Acute Productive-A-AH

## 2023-07-03 NOTE — Telephone Encounter (Signed)
 Appointment schedule, noted. LS

## 2023-07-04 ENCOUNTER — Ambulatory Visit: Admitting: Nurse Practitioner

## 2023-07-04 ENCOUNTER — Encounter: Payer: Self-pay | Admitting: Nurse Practitioner

## 2023-07-04 ENCOUNTER — Ambulatory Visit (HOSPITAL_COMMUNITY)
Admission: RE | Admit: 2023-07-04 | Discharge: 2023-07-04 | Disposition: A | Discharge status: Home / Self Care | Source: Ambulatory Visit | Attending: Nurse Practitioner | Admitting: Nurse Practitioner

## 2023-07-04 VITALS — BP 133/86 | HR 120 | Temp 95.6°F | Ht 63.0 in | Wt 288.2 lb

## 2023-07-04 DIAGNOSIS — R0989 Other specified symptoms and signs involving the circulatory and respiratory systems: Secondary | ICD-10-CM | POA: Insufficient documentation

## 2023-07-04 MED ORDER — IOHEXOL 350 MG/ML SOLN
75.0000 mL | Freq: Once | INTRAVENOUS | Status: AC | PRN
Start: 2023-07-04 — End: 2023-07-04
  Administered 2023-07-04: 75 mL via INTRAVENOUS

## 2023-07-04 NOTE — Patient Instructions (Signed)
 Blood Clot in the Lung (Pulmonary Embolism): What to Know  A pulmonary embolism (PE) is when you get a sudden blockage or less blood flow in one or both of your lungs. This typically happens when a blood clot moves into the arteries of your lung. A blood clot is blood that has turned into a gel or solid. Most blockages come from deep vein thrombosis (DVT). This is when a blood clot forms in the vein of a leg or arm and moves to your lungs. PE is dangerous. It must be treated right away. What are the causes? In most cases, PE is caused by a blood clot that forms in a vein and moves to your lungs.  In rare cases, it may be caused by: Air. Fat. Part of a tumor. A tumor is a growth of cells or tissue that isn't normal. Other tissue that moves through your veins and into your lungs. What increases the risk? You may be more likely to get PE if: You get really hurt, such as from: Breaking a hip or leg. You're also more at risk if your leg is in a cast or splint. An injury to your spinal cord. You have a big surgery, such as: A hip or knee replacement. Surgery on parts of your nervous system. Surgery on your belly. You have certain conditions, such as: A stroke. A problem with how your blood clots. A long-term lung or heart disease. You have a soft tube called a central venous catheter. You're being treated for cancer. You take medicines that have estrogen in them. You smoke. You spend a lot of time sitting, such as if you travel over 6 hours. You may also be more likely to get PE if you're: Pregnant or just had a baby. Older than 70 years of age. Overweight. Not very active. Not able to move at all. What are the signs or symptoms? Symptoms may start all of a sudden. They may include: Feeling short of breath while moving or resting. Coughing, coughing up blood, or coughing up bloody mucus. Pain that gets worse with deep breaths in your: Chest. Back. Shoulder blade. A heartbeat  that's fast or not normal. Feeling light-headed or dizzy. Fainting. Feeling worried or nervous. Pain and swelling in a leg. This is a symptom of DVT. DVT can lead to PE. How is this diagnosed? PE may be diagnosed based on your medical history, an exam, and tests. Tests may include: Blood tests. An electrocardiogram (ECG). This checks how well your heart is working. A CT pulmonary angiogram. This checks blood flow in and around your lungs. A ventilation-perfusion scan. This measures air flow and blood flow to your lungs. An ultrasound to check for a DVT. How is this treated? Treatment may depend on what caused the PE, how likely you are to have bleeding, and other conditions you have. Treatment may be done to: Stop blood clots from forming. Stop the clot from getting bigger. Break apart the clot. Remove the clot. Treatment may include: Medicines to: Stop clots from forming and growing. You may be given blood thinners. Break apart clots. Procedures, such as: An embolectomy. This uses a tube to remove a clot. Catheter-directed thrombolysis. This uses medicine to get rid of a clot. Surgical embolectomy. This is a surgery to get rid of the clot. It's used in rare cases. You may need more than one treatment. Work with your provider to choose the best treatment program for you. Follow these instructions at home: Medicines  Take your medicines only as told. If you're taking blood thinners: Take your medicines as told. Take them at the same time each day. Talk with your provider before taking any products you can buy at the store. Do not do things that could hurt or bruise you. Be careful to avoid falls. Wear an alert bracelet or carry a card that says you take blood thinners. Know what foods and other medicines you can have with your medicines. General instructions Stay at a healthy weight. Ask your provider what weight is healthy for you. Do not smoke, vape, or use nicotine or  tobacco. Do not sit or lie down for a long time without moving. Tell your provider if you plan to travel. Make sure you can still take your medicine while you travel. Ask what things are safe for you to do at home. Ask when you can go back to work or school. Keep all follow-up visits. Your provider will closely watch how you're doing. Where to find more information To learn more, go to these websites: Centers for Disease Control and Prevention at [employer].no. Then: Click Health Topics A-Z. Type "blood clot" into the search box. American Lung Association (ALA): lung.org Contact a health care provider if: You miss a dose of your blood thinner. You have a fever. Get help right away if: You have: New or more pain, swelling, warmth, or redness in an arm or leg. Worse chest pain. A heartbeat that's fast or not normal. A very bad headache. Changes to your eyesight. A very bad fall or accident, or you hit your head. Blood in your pee or poop. A cut that won't stop bleeding. You feel short of breath, and it gets worse when you're moving or resting. You throw up or cough up blood. You feel light-headed or dizzy, and the feeling doesn't go away. You can't move your arms or legs. You're confused or have memory loss. These symptoms may be an emergency. Call 911 right away. Do not wait to see if the symptoms will go away. Do not drive yourself to the hospital. This information is not intended to replace advice given to you by your health care provider. Make sure you discuss any questions you have with your health care provider. Document Revised: 10/16/2022 Document Reviewed: 10/16/2022 Elsevier Patient Education  2024 [employer].

## 2023-07-04 NOTE — Progress Notes (Signed)
   Subjective:    Patient ID: Kristin Ballard, female    DOB: [birth year], 38 y.o.   MRN: [id number]   Chief Complaint: URI  URI  This is a new problem. The current episode started in the past 7 days. The problem has been gradually worsening. There has been no fever. Associated symptoms include congestion, coughing, headaches and rhinorrhea. Pertinent negatives include no sore throat. Associated symptoms comments: SOB. She has tried decongestant for the symptoms. The treatment provided mild relief.  smoker  Patient Active Problem List   Diagnosis Date Noted   Chronic bilateral low back pain without sciatica 10/16/2022   Peripheral polyneuropathy 10/16/2022   Neural foraminal stenosis of lumbar spine 10/16/2022   Vitamin B 12 deficiency 03/05/2022   Guillain Barr syndrome (HCC) 03/05/2022   Iron deficiency anemia 03/05/2022   Constipation 03/05/2022   Impaired mobility and ADLs 12/01/2021   Birth control counseling 10/04/2020   Depression, major, single episode, mild (HCC) 09/15/2019   Morbid obesity (HCC) 07/05/2017   GERD (gastroesophageal reflux disease) 07/05/2017   Current smoker 08/29/2016   Vitamin D deficiency 05/20/2014       Review of Systems  HENT:  Positive for congestion and rhinorrhea. Negative for sore throat.   Respiratory:  Positive for cough.   Neurological:  Positive for headaches.       Objective:   Physical Exam Constitutional:      Appearance: Normal appearance. She is obese.  Cardiovascular:     Rate and Rhythm: Regular rhythm. Tachycardia present.     Heart sounds: Normal heart sounds.  Pulmonary:     Breath sounds: Wheezing (insp and exp wheezes throughout) present.  Skin:    General: Skin is warm.  Neurological:     General: No focal deficit present.     Mental Status: She is alert and oriented to person, place, and time.  Psychiatric:        Mood and Affect: Mood normal.        Behavior: Behavior normal.   O2 sat dropped 87% on room  air with exertion heart rate 137    BP (!) 138/95   Pulse (!) 120   Temp (!) 95.6 F (35.3 C) (Temporal)   Ht 5\' 3"  (1.6 m)   Wt 288 lb 3.2 oz (130.7 kg)   SpO2 96%   BMI 51.05 kg/m      Assessment & Plan:   Kristin Ballard in today with chief complaint of No chief complaint on file.   1. Suspected pulmonary embolism (Primary) Stat CT ordered - CT Angio Chest W/Cm &/Or Wo Cm; Future    The above assessment and management plan was discussed with the patient. The patient verbalized understanding of and has agreed to the management plan. Patient is aware to call the clinic if symptoms persist or worsen. Patient is aware when to return to the clinic for a follow-up visit. Patient educated on when it is appropriate to go to the emergency department.   Mary-Margaret Daphine Deutscher, FNP

## 2023-07-05 ENCOUNTER — Telehealth: Payer: Self-pay | Admitting: Family

## 2023-07-05 ENCOUNTER — Telehealth: Payer: Self-pay

## 2023-07-05 NOTE — Telephone Encounter (Signed)
 duplicate

## 2023-07-05 NOTE — Telephone Encounter (Signed)
 Copied from CRM [id number]. Topic: Clinical - Lab/Test Results >> Jul 05, 2023 11:57 AM Priscille Loveless wrote: Reason for CRM: pt is wanting to know the results of her ct scan.

## 2023-07-05 NOTE — Telephone Encounter (Signed)
 Copied from CRM [id number]. Topic: Clinical - Lab/Test Results >> Jul 05, 2023 11:53 AM Priscille Loveless wrote: Reason for CRM:Pt is wanting to know result of ct scan.

## 2023-07-05 NOTE — Telephone Encounter (Signed)
 Pt notified results are not back and we will reach out once they are. LS

## 2023-07-08 MED ORDER — AMOXICILLIN-POT CLAVULANATE 875-125 MG PO TABS: 1.0000 | ORAL_TABLET | Freq: Two times a day (BID) | ORAL | 0 refills | Status: DC

## 2023-07-08 MED ORDER — AZITHROMYCIN 250 MG PO TABS: ORAL_TABLET | ORAL | 0 refills | Status: DC

## 2023-07-08 NOTE — Addendum Note (Signed)
 Addended by: Bennie Pierini on: 07/08/2023 08:08 AM   Modules accepted: Orders

## 2023-07-10 ENCOUNTER — Other Ambulatory Visit: Payer: Self-pay | Admitting: Family

## 2023-07-11 ENCOUNTER — Ambulatory Visit

## 2023-07-15 ENCOUNTER — Ambulatory Visit (INDEPENDENT_AMBULATORY_CARE_PROVIDER_SITE_OTHER)

## 2023-07-15 DIAGNOSIS — E538 Deficiency of other specified B group vitamins: Secondary | ICD-10-CM

## 2023-07-15 MED ORDER — CYANOCOBALAMIN 1000 MCG/ML IJ SOLN
1000.0000 ug | INTRAMUSCULAR | Status: AC
  Administered 2023-07-15 – 2024-04-23 (×9): 1000 ug via INTRAMUSCULAR

## 2023-07-15 NOTE — Progress Notes (Addendum)
 Patient is in office today for a nurse visit for B12 Injection. Patient Injection was given in the  Left deltoid. Patient tolerated injection well.   I have reviewed and agree with the above  documentation.   Jannifer Rodney, FNP

## 2023-07-20 ENCOUNTER — Other Ambulatory Visit: Payer: Self-pay | Admitting: Family

## 2023-07-20 DIAGNOSIS — K219 Gastro-esophageal reflux disease without esophagitis: Secondary | ICD-10-CM

## 2023-08-05 ENCOUNTER — Other Ambulatory Visit: Payer: Self-pay | Admitting: Family

## 2023-08-15 ENCOUNTER — Other Ambulatory Visit: Payer: Self-pay | Admitting: Family

## 2023-08-15 ENCOUNTER — Ambulatory Visit (INDEPENDENT_AMBULATORY_CARE_PROVIDER_SITE_OTHER)

## 2023-08-15 DIAGNOSIS — F320 Major depressive disorder, single episode, mild: Secondary | ICD-10-CM

## 2023-08-15 DIAGNOSIS — E538 Deficiency of other specified B group vitamins: Secondary | ICD-10-CM | POA: Diagnosis not present

## 2023-08-15 NOTE — Progress Notes (Signed)
 Patient is in office today for a nurse visit for B12 Injection. Patient Injection was given in the  Right deltoid. Patient tolerated injection well.

## 2023-09-02 ENCOUNTER — Other Ambulatory Visit: Payer: Self-pay | Admitting: Family

## 2023-09-16 ENCOUNTER — Ambulatory Visit

## 2023-09-18 ENCOUNTER — Ambulatory Visit (INDEPENDENT_AMBULATORY_CARE_PROVIDER_SITE_OTHER): Admitting: *Deleted

## 2023-09-18 DIAGNOSIS — E538 Deficiency of other specified B group vitamins: Secondary | ICD-10-CM

## 2023-09-18 NOTE — Progress Notes (Signed)
 Patient is in office today for a nurse visit for B12 Injection. Patient Injection was given in the  Left deltoid. Patient tolerated injection well.

## 2023-09-23 ENCOUNTER — Encounter: Payer: Self-pay | Admitting: Family

## 2023-09-23 ENCOUNTER — Ambulatory Visit (INDEPENDENT_AMBULATORY_CARE_PROVIDER_SITE_OTHER)

## 2023-09-23 ENCOUNTER — Ambulatory Visit (INDEPENDENT_AMBULATORY_CARE_PROVIDER_SITE_OTHER): Admitting: Family

## 2023-09-23 VITALS — BP 129/91 | HR 98 | Temp 97.0°F | Ht 63.0 in | Wt 293.0 lb

## 2023-09-23 DIAGNOSIS — G629 Polyneuropathy, unspecified: Secondary | ICD-10-CM

## 2023-09-23 DIAGNOSIS — G8929 Other chronic pain: Secondary | ICD-10-CM

## 2023-09-23 DIAGNOSIS — Z8701 Personal history of pneumonia (recurrent): Secondary | ICD-10-CM

## 2023-09-23 DIAGNOSIS — F17200 Nicotine dependence, unspecified, uncomplicated: Secondary | ICD-10-CM

## 2023-09-23 DIAGNOSIS — K219 Gastro-esophageal reflux disease without esophagitis: Secondary | ICD-10-CM | POA: Diagnosis not present

## 2023-09-23 DIAGNOSIS — M545 Low back pain, unspecified: Secondary | ICD-10-CM

## 2023-09-23 DIAGNOSIS — M48061 Spinal stenosis, lumbar region without neurogenic claudication: Secondary | ICD-10-CM

## 2023-09-23 DIAGNOSIS — F320 Major depressive disorder, single episode, mild: Secondary | ICD-10-CM | POA: Diagnosis not present

## 2023-09-23 NOTE — Progress Notes (Signed)
 Subjective:    Patient ID: Kristin Ballard, female    DOB: [birth year], 38 y.o.   MRN: [id number]  Chief Complaint  Patient presents with   Medical Management of Chronic Issues   Pt presents to the office today for chronic follow up    She is followed by  Neurologists as needed for guillain-barre syndrome. She completed PT.    She was taking OC and doing well.    She is followed by Ortho for chronic back pain. She got steroid injections without relief. Followed by Pain Management monthly.   Reports having increased numbness and tingling in bilateral legs and feet. She takes gabapentin  400 mg TID and Cymbalta  60 mg.     09/23/2023   10:01 AM 07/04/2023    1:53 PM 06/21/2023   11:12 AM  Last 3 Weights  Weight (lbs) 293 lb 288 lb 3.2 oz 294 lb  Weight (kg) 132.904 kg 130.727 kg 133.358 kg   She was diagnosed with bilateral CAP on 07/04/23. Reports her breathing and coughing is much improved, but continues to be hoarse. She completed Augmentin  and Zpak. Will Repeat chest x-ray today.   Hypertension This is a chronic problem. The current episode started more than 1 year ago. The problem has been waxing and waning since onset. The problem is uncontrolled. Associated symptoms include malaise/fatigue. Pertinent negatives include no peripheral edema or shortness of breath. Risk factors for coronary artery disease include obesity and dyslipidemia. The current treatment provides no improvement.  Nicotine Dependence Presents for follow-up visit. Her urge triggers include company of smokers. She smokes < 1/2 a pack of cigarettes per day.  Gastroesophageal Reflux She complains of belching, heartburn and a hoarse voice. This is a chronic problem. The current episode started more than 1 year ago. The problem occurs occasionally. The symptoms are aggravated by certain foods. Risk factors include obesity. She has tried a PPI for the symptoms. The treatment provided moderate relief.  Back Pain This is  a chronic problem. The current episode started more than 1 year ago. The problem occurs intermittently. The pain is present in the lumbar spine. The pain is at a severity of 7/10. The pain is moderate. The symptoms are aggravated by twisting and standing. Risk factors include obesity. She has tried home exercises and bed rest for the symptoms. The treatment provided no relief.  Anemia Presents for follow-up visit. Symptoms include malaise/fatigue.  Depression        This is a chronic problem.  The current episode started more than 1 year ago.   The problem occurs intermittently.  Associated symptoms include no helplessness, no hopelessness and not sad.  Past treatments include SNRIs - Serotonin and norepinephrine reuptake inhibitors. Constipation This is a chronic problem. The current episode started more than 1 year ago. Her stool frequency is 1 time per day. Associated symptoms include back pain. Risk factors include obesity. She has tried diet changes for the symptoms. The treatment provided moderate relief.      Review of Systems  Constitutional:  Positive for malaise/fatigue.  HENT:  Positive for hoarse voice.   Respiratory:  Negative for shortness of breath.   Gastrointestinal:  Positive for constipation and heartburn.  Musculoskeletal:  Positive for back pain.  All other systems reviewed and are negative.  Family History  Problem Relation Age of Onset   Depression Mother    Diabetes Father    Cancer Father        melanoma   Social  History   Socioeconomic History   Marital status: Unknown    Spouse name: Not on file   Number of children: Not on file   Years of education: Not on file   Highest education level: Not on file  Occupational History   Not on file  Tobacco Use   Smoking status: Heavy Smoker    Current packs/day: 0.50    Types: Cigarettes   Smokeless tobacco: Never  Vaping Use   Vaping status: Never Used  Substance and Sexual Activity   Alcohol use: No    Drug use: Yes    Types: Marijuana   Sexual activity: Yes    Birth control/protection: Pill  Other Topics Concern   Not on file  Social History Narrative   Not on file   Social Drivers of Health   Financial Resource Strain: Low Risk  (05/29/2023)   Received from [facility]   Overall Financial Resource Strain (CARDIA)    Difficulty of Paying Living Expenses: Not very hard  Food Insecurity: No Food Insecurity (05/29/2023)   Received from [facility]   Hunger Vital Sign    Within the past 12 months, you worried that your food would run out before you got the money to buy more.: Never true    Within the past 12 months, the food you bought just didn't last and you didn't have money to get more.: Never true  Transportation Needs: No Transportation Needs (05/29/2023)   Received from [facility]   PRAPARE - Transportation    Lack of Transportation (Medical): No    Lack of Transportation (Non-Medical): No  Physical Activity: Not on file  Stress: Not on file  Social Connections: Not on file       Objective:   Physical Exam Vitals reviewed.  Constitutional:      General: She is not in acute distress.    Appearance: She is well-developed. She is obese.  HENT:     Head: Normocephalic and atraumatic.     Comments: Hoarse voice    Right Ear: Tympanic membrane normal.     Left Ear: Tympanic membrane normal.   Eyes:     Pupils: Pupils are equal, round, and reactive to light.   Neck:     Thyroid : No thyromegaly.   Cardiovascular:     Rate and Rhythm: Normal rate and regular rhythm.     Heart sounds: Normal heart sounds. No murmur heard. Pulmonary:     Effort: Pulmonary effort is normal. No respiratory distress.     Breath sounds: Normal breath sounds. No wheezing.  Abdominal:     General: Bowel sounds are normal. There is no distension.     Palpations: Abdomen is soft.     Tenderness: There is no abdominal tenderness.   Musculoskeletal:        General: No  tenderness. Normal range of motion.     Cervical back: Normal range of motion and neck supple.   Skin:    General: Skin is warm and dry.   Neurological:     Mental Status: She is alert and oriented to person, place, and time.     Cranial Nerves: No cranial nerve deficit.     Deep Tendon Reflexes: Reflexes are normal and symmetric.   Psychiatric:        Behavior: Behavior normal.        Thought Content: Thought content normal.        Judgment: Judgment normal.  BP (!) 142/93   Pulse 98   Temp (!) 97 F (36.1 C) (Temporal)   Ht 5' 3 (1.6 m)   Wt 293 lb (132.9 kg)   SpO2 95%   BMI 51.90 kg/m      Assessment & Plan:  TEANA LINDAHL comes in today with chief complaint of Medical Management of Chronic Issues   Diagnosis and orders addressed:  1. Depression, major, single episode, mild (HCC) (Primary) - CMP14+EGFR - CBC with Differential/Platelet  2. Gastroesophageal reflux disease, unspecified whether esophagitis present - CMP14+EGFR - CBC with Differential/Platelet  3. Morbid obesity (HCC) - CMP14+EGFR - CBC with Differential/Platelet  4. Current smoker  - CMP14+EGFR - CBC with Differential/Platelet  5. Chronic bilateral low back pain without sciatica - CMP14+EGFR - CBC with Differential/Platelet  6. Neural foraminal stenosis of lumbar spine  - CMP14+EGFR - CBC with Differential/Platelet  7. Peripheral polyneuropathy - CMP14+EGFR - CBC with Differential/Platelet  8. H/O: pneumonia -Repeat chest x-ray pending  Doing much better - DG Chest 2 View; Future   Labs pending Continue current medications  Keep specialists follow up  Health Maintenance reviewed Diet and exercise encouraged  Follow up plan: 6 months   Tommas Fragmin, FNP

## 2023-09-23 NOTE — Patient Instructions (Signed)

## 2023-09-24 ENCOUNTER — Other Ambulatory Visit: Payer: Self-pay | Admitting: Family

## 2023-09-24 ENCOUNTER — Ambulatory Visit: Payer: Self-pay | Admitting: Family

## 2023-09-24 LAB — CBC WITH DIFFERENTIAL/PLATELET
Basophils Absolute: 0 10*3/uL (ref 0.0–0.2)
Basos: 1 %
EOS (ABSOLUTE): 0.1 10*3/uL (ref 0.0–0.4)
Eos: 2 %
Hematocrit: 39.1 % (ref 34.0–46.6)
Hemoglobin: 12.6 g/dL (ref 11.1–15.9)
Immature Grans (Abs): 0 10*3/uL (ref 0.0–0.1)
Immature Granulocytes: 0 %
Lymphocytes Absolute: 1.9 10*3/uL (ref 0.7–3.1)
Lymphs: 44 %
MCH: 29.6 pg (ref 26.6–33.0)
MCHC: 32.2 g/dL (ref 31.5–35.7)
MCV: 92 fL (ref 79–97)
Monocytes Absolute: 0.3 10*3/uL (ref 0.1–0.9)
Monocytes: 7 %
Neutrophils Absolute: 1.9 10*3/uL (ref 1.4–7.0)
Neutrophils: 46 %
Platelets: 270 10*3/uL (ref 150–450)
RBC: 4.25 x10E6/uL (ref 3.77–5.28)
RDW: 12.3 % (ref 11.7–15.4)
WBC: 4.2 10*3/uL (ref 3.4–10.8)

## 2023-09-24 LAB — CMP14+EGFR
ALT: 34 IU/L — ABNORMAL HIGH (ref 0–32)
AST: 64 IU/L — ABNORMAL HIGH (ref 0–40)
Albumin: 4.1 g/dL (ref 3.9–4.9)
Alkaline Phosphatase: 88 IU/L (ref 44–121)
BUN/Creatinine Ratio: 5 — ABNORMAL LOW (ref 9–23)
BUN: 6 mg/dL (ref 6–24)
Bilirubin Total: 0.3 mg/dL (ref 0.0–1.2)
CO2: 21 mmol/L (ref 20–29)
Calcium: 9 mg/dL (ref 8.7–10.2)
Chloride: 96 mmol/L (ref 96–106)
Creatinine, Ser: 1.14 mg/dL — ABNORMAL HIGH (ref 0.57–1.00)
Globulin, Total: 2.5 g/dL (ref 1.5–4.5)
Glucose: 150 mg/dL — ABNORMAL HIGH (ref 70–99)
Potassium: 3.6 mmol/L (ref 3.5–5.2)
Sodium: 140 mmol/L (ref 134–144)
Total Protein: 6.6 g/dL (ref 6.0–8.5)
eGFR: 62 mL/min/{1.73_m2} (ref >59)

## 2023-10-01 ENCOUNTER — Other Ambulatory Visit: Payer: Self-pay | Admitting: Family

## 2023-10-02 ENCOUNTER — Other Ambulatory Visit: Payer: Self-pay | Admitting: Family

## 2023-10-02 DIAGNOSIS — G8929 Other chronic pain: Secondary | ICD-10-CM

## 2023-10-02 NOTE — Telephone Encounter (Signed)
 Last OV 09/23/23. Last RF 06/14/23 1/2 TID #90 R-2. Next OV 03/24/24

## 2023-10-06 ENCOUNTER — Other Ambulatory Visit: Payer: Self-pay | Admitting: Family

## 2023-10-06 DIAGNOSIS — E559 Vitamin D deficiency, unspecified: Secondary | ICD-10-CM

## 2023-10-18 ENCOUNTER — Ambulatory Visit (INDEPENDENT_AMBULATORY_CARE_PROVIDER_SITE_OTHER): Admitting: *Deleted

## 2023-10-18 DIAGNOSIS — E538 Deficiency of other specified B group vitamins: Secondary | ICD-10-CM

## 2023-10-18 NOTE — Progress Notes (Signed)
 Patient is in office today for a nurse visit for B12 Injection. Patient Injection was given in the  Right deltoid. Patient tolerated injection well.

## 2023-10-27 ENCOUNTER — Other Ambulatory Visit: Payer: Self-pay | Admitting: Family

## 2023-10-27 DIAGNOSIS — K219 Gastro-esophageal reflux disease without esophagitis: Secondary | ICD-10-CM

## 2023-10-28 ENCOUNTER — Other Ambulatory Visit: Payer: Self-pay | Admitting: Family

## 2023-10-28 DIAGNOSIS — M545 Low back pain, unspecified: Secondary | ICD-10-CM

## 2023-11-09 ENCOUNTER — Other Ambulatory Visit: Payer: Self-pay | Admitting: Family

## 2023-11-19 ENCOUNTER — Ambulatory Visit

## 2023-11-26 ENCOUNTER — Other Ambulatory Visit: Payer: Self-pay | Admitting: Family

## 2023-11-26 DIAGNOSIS — F320 Major depressive disorder, single episode, mild: Secondary | ICD-10-CM

## 2023-11-27 ENCOUNTER — Ambulatory Visit (INDEPENDENT_AMBULATORY_CARE_PROVIDER_SITE_OTHER)

## 2023-11-27 DIAGNOSIS — E538 Deficiency of other specified B group vitamins: Secondary | ICD-10-CM | POA: Diagnosis not present

## 2023-11-27 NOTE — Progress Notes (Signed)
 Patient is in office today for a nurse visit for B12 Injection. Patient Injection was given in the  Left deltoid. Patient tolerated injection well.

## 2023-11-29 ENCOUNTER — Other Ambulatory Visit: Payer: Self-pay | Admitting: Family

## 2023-12-30 ENCOUNTER — Ambulatory Visit (INDEPENDENT_AMBULATORY_CARE_PROVIDER_SITE_OTHER): Admitting: *Deleted

## 2023-12-30 DIAGNOSIS — E538 Deficiency of other specified B group vitamins: Secondary | ICD-10-CM | POA: Diagnosis not present

## 2023-12-30 NOTE — Progress Notes (Signed)
 Patient is in office today for a nurse visit for B12 Injection. Patient Injection was given in the  Right deltoid. Patient tolerated injection well.

## 2024-01-14 NOTE — Discharge Summary (Signed)
 ------------------------------------------------------------------------------- Attestation signed by Feliz Margart Fallow, DO at 01/17/24 1857 I was the supervising physician during time of service.  Case was discussed with APP and I agree with management as outlined below. -------------------------------------------------------------------------------   DISCHARGE SUMMARY [facility]   Discharge date:   January 14, 2024 Length of stay:    LOS: 7 days    Discharge Service:   [facility] Hospitalists Discharge Attending Physician: Joana Marice Hurst, PA Discharge to:    To Home Condition at Discharge:  good Code status:                         Full Code   Hospital Course: 01/07/24 Admitted for pancreatitis.  No prior hx.  Admitted for further workup (abdominal US  today), IVF.  NPO.  January 08, 2024 => ultrasound is positive for cholelithiasis and positive Murphy sign.  January 08, 2024 => MRI abdomen MRCP with and without contrast shows mild peripancreatic stranding particularly about the distal body/tail, consistent with mild acute interstitial pancreatitis.  No evidence of pancreatic necrosis or fluid collection.  #2: Cholelithiasis with numerous layering gravel like gallstones extending into gallbladder neck region/proximal cystic duct.  No biliary ductal dilatation or choledocholithiasis.  January 09, 2024 => left upper quadrant pancreatic pain has improved significantly compared to original presentation and right upper quadrant gallbladder pain has increased dramatically.  January 11, 2024 => locum surgeon saw the patient.  Lipase level elevated at 139 (was 189).  Acute pancreatitis is improving but not resolved.  Not prudent to do surgery.  Plan for Dr. Maranda to do surgery on Monday.  Patient will have regular meals Saturday and Sunday and be n.p.o. at midnight on Sunday  _____________________________________   Admission HPI  Patient admitted on: 01/07/2024  8:53 PM   Patient admitted by: Thersia Roger, D.O.   CHIEF COMPLAINT:  Abd pain   Day of admission HPI:  Kristin Ballard  is a 38 y.o. female with a PMH significant for anxiety, depression, GERD, neuropathy who presented with one week history of abdominal pain, associated with N/V/D.  No prior history of pancreatitis, gallstones.  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  Regena followed with neurology who discharged her to PCP. She noted today that her lower extremity numbness has returned but she hasn't yet follow up with her PCP.    Otherwise there has been no change in status. Patient has been taking medication as prescribed and there has been no recent change in medication or diet. There has been no recent illness/hospitalizations, travel or sick contacts.  Patient denies fevers/chills, weakness, dizziness, chest pain, shortness of breath, dysuria/frequency, changes in mental status.  In the ED the patient received dilaudid, zofran, NS.   Medical admission was requested for further workup and management of acute uncomplicated pancreatitis.     Patient admitted on Home O2? - No Patient on home anticoagulant? -  No Patient admitted with Chronic home foley catheter? - No Foley catheter placed or replaced by another service prior to admission? - No Central Line Status: None   Mental Status on Admission: The patient IS Alert and oriented to PERSON The patient IS  Alert And oriented to TIME The patient IS Alert and oriented to LOCATION   Today; Pt laying in bed awake and alert.  Her abdominal pain is a little improved after getting Dilaudid.  No further N & V.     Problem List, Assessment & Plan     ASSESSMENT & PLAN (In order of descending acuity)   38 y.o. female with a PMH significant for anxiety, depression, GERD,  neuropathy  now admitted with:   Acute uncomplicated pancreatitis Patient is symptomatic with:  abdominal pain, N//V/D On admission WBC 14K, lipase 815, TB 1.3, Imaging reveals:  Acute interstitial, edematous pancreatitis, severe hepatic steatosis, moderate hepatomegaldy, gallbladder sludge and possible stones without evidence of cholecystitis Continue aggressive IVF, IV analgesics  Triglycerides normal; drinks socially only, not regularly Abdominal US  10/1 shows cholelithiasis with positive sonographic Murphy's sign Bilirubin 1.4, LFT's normal; direct bili pending.  Lipase 815. Get MRCP   AKI Creatinine 1.46 on admission, after IVF down to 0.99 Continue to monitor, likely secondary to dehydrtaion   Mild hypokalemia, hypomagnesemia Potassium 3.4, repleted IV, 3.8 10/1 Magnesium 1.1 on admission, replete orally and with IV riders 2g January 11, 2024 => sodium = 129; will replete with 3% hyper tonic saline at 50 cc/h x 3-hour; and oral salt tabs starting tomorrow and monitor closely daily.   Normocytic anemia Hemoglobin = 7.5 on 10/4 <== Hg 8.3 on 10/2,<==8.7 10/1 <== 9.6 on admission Iron panel => 01/08/2024 => iron = normal = 138; TIBC = normal = 263; iron saturation = normal   Anxiety/depression Continue duloxetine  Pt calm today   Chronic pain/neuropathy  Continue baclofen , gabapentin   Will need follow up with PCP given recurrence of symptoms  Fall precautions   GERD  Continue IV PPI   Anticoagulation; Lovenox Oxygen; none IVF; LR at 150/hr   Continue current treatment plan.  Pt is getting IVF and IV pain control, is NPO.  Getting further workup today.  Discussed pt's care with Dr. Rosena attending.        DVT prevention; Lovenox    ADDITIONAL NON-ACUTE FINDINGS, OBSERVATIONS, FAMILY DISCUSSIONS, ETC. (When present):   Physical exam General: No acute distress; no change in exam today vs yesterday; patient doing well. HEENT [place]/AT; moist mucous membrane Heart: Heart rate  wnl: Rhythm is reg    Lungs: Clear to auscultation bilaterally Abdomen: Soft nontender nondistended on left upper quadrant but right upper quadrant is tender to palpation secondary cholecystitis Extremities: No peripheral edema Skin: Dry no rash Neuropsych: Within normal limits     _____     October 7 => CMP within normal limits except albumin slightly low at 2.3 and protein slightly low at 5.7 AST slightly high at 44 Alkaline phosphatase at 146  Lipase high at 101 (was 134 yesterday)  CBC within normal limits except hemoglobin low at 8.2 (received 1 unit of blood yesterday prior to surgery) _______________________________    Mental Status On day of Discharge:  The patient is Alert and oriented to PERSON The patient is Alert And oriented to TIME The patient is Alert and oriented to LOCATION  CODE STATUS :                    Full Code   An advanced care planning discussion was  had with patient and/or patient's decisions maker (documented separately).  Patient discharged on Home O2? - no Patient discharged on home anticoagulant? -  no  Foley Catheter status: None Central Line Status: NONE  Time Spent on Discharge I spent greater than 30 minutes counseling and coordinating care for the discharge of this patient. The patient  and I discussed the importance of outpatient follow-up as well as concerning signs and symptoms that would require immediate evaluation by a medical professional. The aforementioned conversation participants understand  and did show insight. I did use teachback to ensure understanding. The above participant/s is aware that not following the discussed plan, recommendations, and follow up can lead to severe negative effects on the patient's health, up to and including death.  Discharge Medications     Your Medication List     STOP taking these medications    acetaminophen 325 MG tablet Commonly known as: TYLENOL   albuterol 90 mcg/actuation  inhaler Commonly known as: PROVENTIL HFA;VENTOLIN HFA   cyanocobalamin  (vitamin B-12) 1,000 mcg/mL injection   docusate sodium 50 MG capsule Commonly known as: COLACE   ergocalciferol -1,250 mcg (50,000 unit) 1,250 mcg (50,000 unit) capsule Commonly known as: DRISDOL    folic acid  1 MG tablet Commonly known as: FOLVITE    hydrocortisone  25 mg suppository Commonly known as: ANUSOL -HC   magnesium oxide 400 mg (241.3 mg elemental) tablet Commonly known as: MAG-OX   SPRINTEC (28) 0.25-0.035 mg per tablet Generic drug: norgestimate -ethinyl estradiol        START taking these medications    ciprofloxacin HCl 500 MG tablet Commonly known as: CIPRO Take 1 tablet (500 mg total) by mouth two (2) times a day for 7 days.   metroNIDAZOLE 500 MG tablet Commonly known as: FLAGYL Take 1 tablet (500 mg total) by mouth Three (3) times a day for 7 days.   naproxen 500 MG tablet Commonly known as: NAPROSYN Take 1 tablet (500 mg total) by mouth 2 (two) times a day with meals.   ondansetron 4 MG tablet Commonly known as: ZOFRAN Take 1 tablet (4 mg total) by mouth every six (6) hours as needed for nausea for up to 7 days.       CHANGE how you take these medications    HYDROcodone-acetaminophen 5-325 mg per tablet Commonly known as: NORCO Take 1-2 tablets by mouth every four (4) hours as needed for pain for up to 5 days. What changed:  how much to take when to take this reasons to take this       CONTINUE taking these medications    baclofen  10 MG tablet Commonly known as: LIORESAL  Take 1 tablet (10 mg total) by mouth Three (3) times a day.   biotin 1 mg Cap Take 1 mg by mouth in the morning.   cetirizine  10 MG tablet Commonly known as: ZYRTEC  Take 1 tablet (10 mg total) by mouth in the morning.   DULoxetine  60 MG capsule Commonly known as: CYMBALTA  Take 1 capsule (60 mg total) by mouth in the morning.   gabapentin  400 MG capsule Commonly known as: NEURONTIN  Take 1  capsule (400 mg total) by mouth Three (3) times a day.   hydrOXYzine  25 MG tablet Commonly known as: ATARAX  Take 1 tablet (25 mg total) by mouth every eight (8) hours as needed for anxiety.   omeprazole  20 MG capsule Commonly known as: PriLOSEC Take 2 capsules (40 mg total) by mouth in the morning.   potassium chloride  20 MEQ ER tablet Take 1 tablet (20 mEq total) by mouth in the morning.   WEGOVY  0.5 mg/0.5 mL injection pen Generic drug: semaglutide  (weight loss) Inject 1 mg under the skin once a week.       _____________________________________  Nutrition:  Patient does not meet AND/ASPEN criteria for malnutrition at this time (01/09/24 1130)   ___________________________________________  Discharge Instructions    You are diagnosed with =>   * (Principal) Acute pancreatitis without infection or necrosis (HHS-HCC) - Primary - Imaging negative for retained CBD stones, No evidence of active cholecystitis present, No sepsis or signs of pancreatic necrosis   POD 1 status post laparoscopic cholecystectomy intraoperative cholangiogram, patient doing well postoperatively  Please do the following =>  Advance to regular diet  Has you tolerated your diet well, I will discharge you home   You can use oral pain control (hydrocodone 5/325) q 4 hrs prn pain x 5 days if needed.  Try Tylenol first if the pain is not too bad.  Follow-up Dr. Maranda in 1 week as scheduled.  No lifting pulling or pushing greater than 25 pounds for the next 2 weeks  Okay to shower in 24 hours  It has been a pleasure taking care of you.  I hope you get well soon.  Please do not hesitate to come back to our ER or go to the closest ER if you have any problems.  Nutrition:                                   Activity:                                   Activity Instructions     Activity as tolerated         Appointments:                         Appointments which  have been scheduled for you    Jan 20, 2024 11:15 AM (Arrive by 11:05 AM) POST OP with Ellaree Sergio Maranda, MD [facility] [address] [place]) [address] [place] [id number]         Follow Up:                              Follow Up instructions and Outpatient Referrals    Ambulatory Referral to General Surgery     Reason for referral: Follow-up cholecystectomy   Requested follow up plan: I would resume responsibility.   Call MD for:  difficulty breathing, headache or visual disturbances     Call MD for:  persistent dizziness or light-headedness     Call MD for:  persistent nausea or vomiting     Call MD for:  severe uncontrolled pain     Call MD for: Temperature > 38.5 Celsius ( > 101.3 Fahrenheit)     Discharge instructions         Allergies  Allergen Reactions  . Latex   . Tizanidine Confusion, Other (See Comments) and Hallucinations    Other Reaction(s): Delusions (intolerance)     Past Medical History[1]  Past Surgical History[2]   Family History[3]   Current Medications[4]  Imaging  XR Cholangiogram Intraoperative Result Date: 01/13/2024 Exam:  Intraoperative Cholangiogram  History:  Procedural guidance.  Technique:  Fluoroscopic image(s) from the operating room. Radiation Dose (reference air kerma):  30.423 mGy  Comparison:  01/08/2024.  Findings:  There is cannulation of the cystic duct.  Multiple clips in the gallbladder fossa region consistent with cholecystectomy. Common bile duct measures approximately 10 mm, previously 7 mm.  There are no intraluminal filling defects.  Contrast is seen entering the second duodenum.    No evidence of choledocholithiasis.  Signed (Electronic Signature): 01/13/2024 2:36 PM Signed By: Emelia Bame, MD  MRI Abdomen MRCP W Wo Contrast Result Date: 01/08/2024 Exam:  MRI Abdomen without and with Contrast (MRCP)  History:  Elevated bilirubin;  pancreatitis; gallstones  Technique:  Standard MRI protocol of the abdomen (includes MRCP) without and with IV contrast.   Comparison:  CT abdomen/pelvis dated 01/07/2024  Findings:  LIVER:  The liver is normal in morphology, volume, and signal. Diffusion images are equally normal. Dynamic enhancement images are within normal limits. No diffuse or focal liver disease is suggested.  SPLEEN:  The spleen is normal in morphology, signal, and size. Normal dynamic enhancement patterns.  PANCREAS:  Normal background pancreatic parenchyma. No evidence of focal lesion, cyst, or necrosis/fluid collection. There is mild peripancreatic stranding particularly about the distal body/tail.  Pre-and postcontrast signal characteristics are within normal limits.  GALLBLADDER AND BILIARY SYSTEM (MRCP):  Numerous layering gravel-like gallstones extending into the gallbladder neck region/proximal cystic duct. No biliary ductal dilatation. No choledocholithiasis.  KIDNEYS AND ADRENALS:  The kidneys enhance symmetrically. No hydronephrosis. No solid renal masses. The adrenal glands are symmetric and normal in structure, signal and size.  RETROPERITONEUM:  Aortic caliber and contour are normal. No significant atherosclerotic disease. Cava is developmentally normal and widely patent. Portal venous system is widely patent and normal. There is no evidence of adenopathy in the retroperitoneum, mesenteric root or periportal region.  ADDITIONAL FINDINGS:  Bowel caliber and contour appear normal throughout the field-of-view. There is no inflammation or obstruction. No ascites, pleural or pericardial effusion. No hiatal hernia. Marrow signal in the field-of-view is homogeneous and benign.     -- Mild peripancreatic stranding particularly about the distal body/tail, consistent with mild acute interstitial pancreatitis. No evidence of pancreatic necrosis or fluid collection. -- Cholelithiasis with numerous layering gravel-like gallstones extending  into the gallbladder neck region/proximal cystic duct. No biliary ductal dilatation or choledocholithiasis.  Signed (Electronic Signature): 01/08/2024 2:10 PM Signed By: Alyce Bellman, MD  US  Abdomen Limited Result Date: 01/08/2024 Exam: Right upper quadrant ultrasound  History:  Acute pancreatitis  Technique: Real-time ultrasound of the right upper quadrant of the abdomen.  Comparison:  CT 01/07/2024  Findings:  LIVER:  The liver is 19.2 cm in length. It is echodense with no focal mass.   Hepatopetal main portal vein flow demonstrated.  GALLBLADDER:  There is cholelithiasis. No gallbladder wall thickening. There is a positive sonographic Murphy sign. No pericholecystic fluid.   BILE DUCTS:  There is no intra- or extrahepatic biliary duct dilatation.  The common hepatic/common bile duct measures 3 mm.  PANCREAS:  The pancreas is poorly visualized. No gross peripancreatic fluid collection.   AORTA AND IVC:  The aorta and IVC are poorly visualized but normal as seen.  KIDNEYS: The right kidney is 10.3 cm in length. No hydronephrosis or gross mass.  ASCITES:  There is no ascites.    1.    Cholelithiasis with a positive sonographic Murphy's sign. No gallbladder wall thickening or pericholecystic fluid. Correlate for clinical findings of acute cholecystitis, as the presence of pancreatitis may limit specificity of a sonographic Murphy's sign..  2.    Hepatic steatosis.  3.    Poor visualization of the pancreas.  Signed Proofreader):  01/08/2024 9:26 AM Signed By: Abby Blossom, MD  CT Abdomen Pelvis Wo Contrast Result Date: 01/07/2024 Exam: CT of the Abdomen and Pelvis without Contrast  History: Abdominal pain.  Technique: Routine CT of the abdomen and pelvis without IV contrast. AEC (automated exposure control) and/or manual techniques such as size-specific kV and mAs are employed where appropriate to reduce radiation exposure for all CT exams.  Comparison:  09/21/2021  Findings: LOWER THORAX:  Clear lungs. No  pleural effusion.  LIVER:  Severe parenchymal hypoattenuation. Moderate hepatomegaly. Smooth contour. No focal lesion.  BILIARY: Layering hyperdense gallbladder sludge and possible stones. No evidence of cholecystitis. No visible choledocholithiasis. No biliary ductal dilation.  PANCREAS: Peripancreatic mild fat stranding and minimal fluid extending into the mesenteric root, lesser sac and anterior pararenal space bilaterally. Normal background parenchyma. No focal lesion. Main pancreatic duct normal in caliber.  SPLEEN: Normal size.  ADRENALS:  Normal morphology.  KIDNEYS/URETERS:  No hydroureteronephrosis. No nephroureteral calculus. No suspicious lesion.  BOWEL:  Stomach normal. No evidence of enterocolitis. No obstruction.  Normal appendix.  VASCULAR:  Normal caliber. No atherosclerosis.  LYMPH NODES:  No lymphadenopathy.  PERITONEUM: No ascites.  PELVIC ORGANS:  Normal bladder, uterus, and ovaries.  BONES:  No aggressive lesion. No acute abnormality.  SOFT TISSUES: Normal.    1.    Acute interstitial edematous pancreatitis. 2.    Severe hepatic steatosis. Moderate hepatomegaly. 3.    Gallbladder sludge and possible stones. No evidence of cholecystitis.  Signed (Electronic Signature): 01/07/2024 8:56 PM Signed By: Genia Rosebush, MD   Lab Results   Recent Labs    01/14/24 0606  WBC 7.2  HGB 8.2*  HCT 26.4*  PLT 305   Recent Labs    01/14/24 0606  NA 137  K 4.3  CL 104  CO2 22.7  BUN 9  CREATININE 1.05  GLU 107  CALCIUM 8.6  ALBUMIN 2.3*  PROT 5.7*  BILITOT 0.9  AST 44*  ALT 27  ALKPHOS 146*  LIPASE 101*   No results for input(s): CKTOTAL, CKMB, PCTCKMB, TROPONINI, EDTPNI, BNP, INR, LABPROT, APTT, DDIMER in the last 72 hours. No results for input(s): WBCUA, NITRITE, LEUKOCYTESUR, BACTERIA, RBCUA, BLOODU, GLUCOSEU, PROTEINUA, KETONESU, KETUR in the last 72 hours. No results for input(s): OPIAU, BENZU, TRICYCLIC, PCPU, AMPHU,  COCAU, CANNAU, BARBU, ETOH, ACETAMIN, SALICYLATE in the last 72 hours. No results for input(s): PREGTESTUR, PREGPOC in the last 72 hours. No results for input(s): OCCULTBLD, RAPSCRN, CDIFRPCR, CDIFFNAP1, A1C, CHOL, LDL, HDL, TRIG in the last 72 hours. No results for input(s): O2SOUR, FIO2ART, PHART, PCO2ART, PO2ART, HCO3ART, O2SATART, BEART in the last 72 hours. Pending Labs     Order Current Status   Surgical pathology exam In process       Home Medications   Prior to Admission medications  Medication Dose, Route, Frequency  acetaminophen (TYLENOL) 325 MG tablet 650 mg, Every 6 hours PRN  albuterol HFA 90 mcg/actuation inhaler 2 puffs  baclofen  (LIORESAL ) 10 MG tablet 10 mg, 3 times a day (standard)  biotin 1 mg cap 1 mg, Daily  cetirizine  (ZYRTEC ) 10 MG tablet 1 tablet, Daily (standard)  cyanocobalamin , vitamin B-12, 1,000 mcg/mL injection 1,000 mcg, Every 30 days  DULoxetine  (CYMBALTA ) 60 MG capsule 1 capsule, Daily (standard)  folic acid  (FOLVITE ) 1 MG tablet 1 mg, Daily (standard)  gabapentin  (NEURONTIN ) 400 MG capsule 400 mg, 3 times a day (standard)  hydrOXYzine  (ATARAX ) 25 MG tablet 25 mg, Every 8 hours PRN  magnesium oxide (MAG-OX)  400 mg (241.3 mg elemental magnesium) tablet 800 mg, Daily (standard)  omeprazole  (PRILOSEC) 20 MG capsule 40 mg, Daily (standard)  potassium chloride  20 MEQ ER tablet 1 tablet, Daily (standard)  SPRINTEC, 28, 0.25-35 mg-mcg per tablet 1 tablet, Daily (standard)  WEGOVY  0.5 MG/0.5 ML SUBCUTANEOUS PEN INJECTOR 1 mg, Weekly  ciprofloxacin HCl (CIPRO) 500 MG tablet 500 mg, Oral, 2 times a day (standard)  docusate sodium (COLACE) 50 MG capsule 50 mg, 2 times a day (standard) Patient not taking: Reported on 01/07/2024  ergocalciferol -1,250 mcg, 50,000 unit, (DRISDOL ) 1,250 mcg (50,000 unit) capsule 1,250 mcg, Weekly Patient not taking: Reported on 01/07/2024  ergocalciferol -1,250 mcg, 50,000 unit,  (DRISDOL ) 1,250 mcg (50,000 unit) capsule 50,000 Units, Every 7 days Patient not taking: Reported on 01/07/2024  HYDROcodone-acetaminophen (NORCO) 5-325 mg per tablet 1 tablet, Every 6 hours PRN Patient not taking: Reported on 01/07/2024  HYDROcodone-acetaminophen (NORCO) 5-325 mg per tablet 1-2 tablets, Oral, Every 4 hours PRN  hydrocortisone  (ANUSOL -HC) 25 mg suppository 25 mg Patient not taking: Reported on 01/07/2024  metroNIDAZOLE (FLAGYL) 500 MG tablet 500 mg, Oral, 3 times a day (standard)  naproxen (NAPROSYN) 500 MG tablet 500 mg, Oral, 2 times a day with meals  ondansetron (ZOFRAN) 4 MG tablet 4 mg, Oral, Every 6 hours PRN   Joana JONETTA Hurst, PA Hospitalist, [facility] 01/14/24, 2:19 PM       [1] Past Medical History: Diagnosis Date  . Anxiety   . Depression   . Fracture    rt ankle  . GBS (Guillain Barre syndrome) (HHS-HCC)   . GERD (gastroesophageal reflux disease)   [2] Past Surgical History: Procedure Laterality Date  . PR LAP,CHOLECYSTECTOMY/GRAPH N/A 01/13/2024   Procedure: LAPAROSCOPIC CHOLECYSTECTOMY WITH CHOLANGIOGRAPHY;  Surgeon: Maranda Ellaree Bound, MD;  Location: OR [facility];  Service: General Surgery  . PR OPEN TX DISTAL TIBIOFIBULAR JOINT DISRUPTION Right 01/07/2020   Procedure: OPEN TREATMENT OF DISTAL TIBIOFIBULAR JOINT (SYNDESMOSIS) DISRUPTION, INCLUDES INTERNAL FIXATION WITH SYMDESMOTIC SCREW RIGHT **[employer] Marcey**;  Surgeon: Elspeth Ruth Case, MD;  Location: OR [facility];  Service: Orthopedics  [3] No family history on file. [4]  Current Facility-Administered Medications:  .  acetaminophen (TYLENOL) tablet 650 mg, 650 mg, Oral, Q4H PRN, Cathey, Ellaree Bound, MD .  albuterol 2.5 mg /3 mL (0.083 %) nebulizer solution 2.5 mg, 2.5 mg, Nebulization, Q6H PRN, Cathey, Ellaree Bound, MD .  aluminum-magnesium hydroxide-simethicone (MAALOX MAX) 80-80-8 mg/mL oral suspension, 30 mL, Oral, Q4H PRN, Cathey, Ellaree Bound, MD .  baclofen  (LIORESAL ) tablet 10 mg, 10  mg, Oral, TID, Cathey, Ellaree Bound, MD, 10 mg at 01/14/24 1415 .  bisacodyl (DULCOLAX) EC tablet 10 mg, 10 mg, Oral, Daily PRN, Cathey, Ellaree Bound, MD .  cetirizine  (ZYRTEC ) tablet 10 mg, 10 mg, Oral, Daily, Cathey, Ellaree Bound, MD, 10 mg at 01/14/24 0836 .  DULoxetine  (CYMBALTA ) DR capsule 60 mg, 60 mg, Oral, Daily, Cathey, Ellaree Bound, MD, 60 mg at 01/14/24 0836 .  folic acid  (FOLVITE ) tablet 1 mg, 1 mg, Oral, Daily, Cathey, Ellaree Bound, MD, 1 mg at 01/14/24 0836 .  gabapentin  (NEURONTIN ) capsule 400 mg, 400 mg, Oral, TID, Maranda Ellaree Bound, MD, 400 mg at 01/14/24 1415 .  guaiFENesin (ROBITUSSIN) oral syrup, 200 mg, Oral, Q4H PRN, Cathey, Ellaree Bound, MD .  hydrOXYzine  (ATARAX ) tablet 25 mg, 25 mg, Oral, Q8H PRN, Maranda Ellaree Bound, MD, 25 mg at 01/12/24 2021 .  ketorolac (TORADOL) injection 30 mg, 30 mg, Intravenous, Q8H, Cathey, Ellaree Bound, MD, 30 mg at 01/14/24  9162 .  magnesium oxide (MAG-OX) tablet 200 mg, 200 mg, Oral, BID, Cathey, Ellaree Bound, MD, 200 mg at 01/14/24 0836 .  melatonin tablet 3 mg, 3 mg, Oral, Nightly PRN, Maranda Ellaree Bound, MD, 3 mg at 01/11/24 2045 .  naloxone (NARCAN) injection 0.1 mg, 0.1 mg, Intravenous, Q5 Min PRN, Cathey, Ellaree Bound, MD .  ondansetron (ZOFRAN-ODT) disintegrating tablet 4 mg, 4 mg, Oral, Q8H PRN, 4 mg at 01/12/24 1033 **OR** ondansetron (ZOFRAN-ODT) disintegrating tablet 8 mg, 8 mg, Oral, Q8H PRN, Cathey, Ellaree Bound, MD .  pantoprazole (Protonix) injection 40 mg, 40 mg, Intravenous, Daily, Cathey, Ellaree Bound, MD, 40 mg at 01/14/24 0837 .  piperacillin-tazobactam (ZOSYN) 3.375 g in sodium chloride 0.9 % (NS) 100 mL IVPB-connector bag, 3.375 g, Intravenous, Q8H, Cathey, Ellaree Bound, MD, Last Rate: 25 mL/hr at 01/14/24 1226, 3.375 g at 01/14/24 1226 .  polyethylene glycol (MIRALAX) packet 17 g, 17 g, Oral, Daily PRN, Cathey, Ellaree Bound, MD .  prochlorperazine (COMPAZINE) injection  2.5 mg, 2.5 mg, Intravenous, Q8H PRN, 2.5 mg at 01/13/24 2012 **OR** prochlorperazine (COMPAZINE) injection 5 mg, 5 mg, Intravenous, Q8H PRN, Cathey, Ellaree Bound, MD .  sodium chloride (NS) 0.9 % infusion, 75 mL/hr, Intravenous, Continuous, Cathey, Ellaree Bound, MD, Stopped at 01/12/24 2217

## 2024-01-20 ENCOUNTER — Encounter: Payer: Self-pay | Admitting: Family Medicine

## 2024-01-20 ENCOUNTER — Ambulatory Visit: Admitting: Family Medicine

## 2024-01-20 VITALS — BP 127/80 | HR 94 | Temp 97.6°F | Ht 63.0 in | Wt 320.8 lb

## 2024-01-20 DIAGNOSIS — Z9049 Acquired absence of other specified parts of digestive tract: Secondary | ICD-10-CM | POA: Diagnosis not present

## 2024-01-20 DIAGNOSIS — Z09 Encounter for follow-up examination after completed treatment for conditions other than malignant neoplasm: Secondary | ICD-10-CM | POA: Diagnosis not present

## 2024-01-20 DIAGNOSIS — R600 Localized edema: Secondary | ICD-10-CM

## 2024-01-20 DIAGNOSIS — K851 Biliary acute pancreatitis without necrosis or infection: Secondary | ICD-10-CM

## 2024-01-20 MED ORDER — FUROSEMIDE 40 MG PO TABS: 40.0000 mg | ORAL_TABLET | Freq: Every day | ORAL | 0 refills | Status: DC | PRN

## 2024-01-20 NOTE — Progress Notes (Signed)
 Subjective: CC: Hospital discharge follow-up PCP: Lavell Bari LABOR, FNP [name] is a 38 y.o. female who is accompanied today by her spouse and child.  She is presenting to clinic today for:  Hospital discharge follow-up for acute pancreatitis secondary to gallbladder etiology She presented with abdominal pain and was found to have acute uncomplicated pancreatitis.  It was determined this was mediated by gallbladder pathology and she subsequently underwent laparoscopic cholecystectomy.  She was noted to be anemic but iron levels were found to be normal in the hospital.  Discharge hemoglobin was 8.2 with a nadir of 7.5.  At discharge, she had a mild elevation in one of her LFTs and alkaline phosphatase.  She apparently had an AKI that was corrected with IV fluids as well as hyponatremia which was corrected with sodium.  I discharged AKI have resolved and sodium level within normal range.  Lipase was still elevated over 100.  She reports that she has been having improved abdominal pain compared to when she was hospitalized.  Having some postsurgical of pain on the right upper quadrant that is roughly a 5-6/10.  She saw her surgeon this morning and her wound was cleaned and redressed.  She still having a little bit of leakage but they were not concerned at this time.  She is on dual antibiotics currently.  Advised to take oral NSAID.  No further opioids recommended at this time by surgery.  She has follow-up again next week for hopefully staple removal.  Her biggest concern today is that she is having lower extremity edema that causes her legs to feel quite bruised.  She was given again both salt and fluids in the hospital.  She does not have a follow-up with her PCP until the end of November.  She is tolerating p.o. intake without difficulty.  She is not yet back to her baseline for ambulation but notes that her history is complicated by history of GBS.  She does not wish to have any home  health services as she is doing the home health physical therapy that was prescribed to her when she was diagnosed with GBS and had to learn to walk again   ROS: Per HPI  Allergies  Allergen Reactions   Latex    Tizanidine Other (See Comments)    Other Reaction(s): Delusions (intolerance)   Past Medical History:  Diagnosis Date   Allergy    Functional ovarian cysts    Migraines     Current Outpatient Medications:    amoxicillin -clavulanate (AUGMENTIN ) 875-125 MG tablet, Take 1 tablet by mouth 2 (two) times daily., Disp: 20 tablet, Rfl: 0   azithromycin  (ZITHROMAX  Z-PAK) 250 MG tablet, As directed, Disp: 6 tablet, Rfl: 0   baclofen  (LIORESAL ) 10 MG tablet, TAKE 1/2 (ONE-HALF) TABLET BY MOUTH 4 TIMES DAILY AS NEEDED, Disp: 90 tablet, Rfl: 1   Biotin 1 MG CAPS, Take by mouth., Disp: , Rfl:    Blood Pressure Monitoring (BLOOD PRESSURE KIT) DEVI, 1 Device by Does not apply route daily as needed., Disp: 1 each, Rfl: 0   Calcium 200 MG TABS, Take 1 capsule by mouth daily., Disp: , Rfl:    cetirizine  (ZYRTEC ) 10 MG tablet, Take 1 tablet by mouth once daily, Disp: 90 tablet, Rfl: 1   cyanocobalamin  (VITAMIN B12) 1000 MCG/ML injection, Inject 1,000 mcg into the muscle once., Disp: , Rfl:    diazepam  (VALIUM ) 5 MG tablet, Take one tablet by mouth with food one hour prior to procedure.  May repeat 30 minutes prior if needed. (Patient not taking: Reported on 09/23/2023), Disp: 2 tablet, Rfl: 0   docusate sodium (COLACE) 50 MG capsule, Take 50 mg by mouth 2 (two) times daily., Disp: , Rfl:    DULoxetine  (CYMBALTA ) 60 MG capsule, Take 1 capsule by mouth once daily, Disp: 90 capsule, Rfl: 1   folic acid  (FOLVITE ) 1 MG tablet, Take 1 tablet by mouth once daily, Disp: 100 tablet, Rfl: 1   gabapentin  (NEURONTIN ) 400 MG capsule, Take 1 capsule (400 mg total) by mouth 3 (three) times daily as needed., Disp: 270 capsule, Rfl: 2   hydrOXYzine  (ATARAX ) 25 MG tablet, Take 1 tablet by mouth three times daily as  needed, Disp: 90 tablet, Rfl: 3   norgestimate -ethinyl estradiol  (SPRINTEC 28) 0.25-35 MG-MCG tablet, Take 1 tablet by mouth once daily, Disp: 84 tablet, Rfl: 3   omeprazole  (PRILOSEC) 40 MG capsule, Take 1 capsule by mouth once daily, Disp: 90 capsule, Rfl: 1   ondansetron (ZOFRAN) 4 MG tablet, Take 4 mg by mouth every 8 (eight) hours as needed., Disp: , Rfl:    potassium chloride  SA (KLOR-CON  M20) 20 MEQ tablet, Take 1 tablet by mouth once daily, Disp: 90 tablet, Rfl: 1   Vitamin D , Ergocalciferol , (DRISDOL ) 1.25 MG (50000 UNIT) CAPS capsule, Take 1 capsule by mouth once a week, Disp: 12 capsule, Rfl: 1  Current Facility-Administered Medications:    cyanocobalamin  (VITAMIN B12) injection 1,000 mcg, 1,000 mcg, Intramuscular, Q30 days, Hawks, Christy A, FNP, 1,000 mcg at 12/30/23 1014 Social History   Socioeconomic History   Marital status: Unknown    Spouse name: Not on file   Number of children: Not on file   Years of education: Not on file   Highest education level: Not on file  Occupational History   Not on file  Tobacco Use   Smoking status: Heavy Smoker    Current packs/day: 0.50    Types: Cigarettes   Smokeless tobacco: Never  Vaping Use   Vaping status: Never Used  Substance and Sexual Activity   Alcohol use: No   Drug use: Yes    Types: Marijuana   Sexual activity: Yes    Birth control/protection: Pill  Other Topics Concern   Not on file  Social History Narrative   Not on file   Social Drivers of Health   Financial Resource Strain: Low Risk  (01/08/2024)   Received from [facility]   Overall Financial Resource Strain (CARDIA)    How hard is it for you to pay for the very basics like food, housing, medical care, and heating?: Not very hard  Food Insecurity: No Food Insecurity (01/08/2024)   Received from [facility]   Hunger Vital Sign    Within the past 12 months, you worried that your food would run out before you got the money to buy more.: Never true     Within the past 12 months, the food you bought just didn't last and you didn't have money to get more.: Never true  Transportation Needs: No Transportation Needs (01/08/2024)   Received from [facility] - Transportation    Lack of Transportation (Medical): No    Lack of Transportation (Non-Medical): No  Physical Activity: Inactive (01/08/2024)   Received from [facility]   Exercise Vital Sign    On average, how many days per week do you engage in moderate to strenuous exercise (like a brisk walk)?: 0 days  On average, how many minutes do you engage in exercise at this level?: 0 min  Stress: No Stress Concern Present (01/08/2024)   Received from [facility] - Occupational Stress Questionnaire    Do you feel stress - tense, restless, nervous, or anxious, or unable to sleep at night because your mind is troubled all the time - these days?: Not at all  Social Connections: Socially Isolated (01/08/2024)   Received from [facility]   Social Connection and Isolation Panel    In a typical week, how many times do you talk on the phone with family, friends, or neighbors?: More than three times a week    How often do you get together with friends or relatives?: Once a week    How often do you attend church or religious services?: Never    Do you belong to any clubs or organizations such as church groups, unions, fraternal or athletic groups, or school groups?: No    How often do you attend meetings of the clubs or organizations you belong to?: Never    Are you married, widowed, divorced, separated, never married, or living with a partner?: Separated  Intimate Partner Violence: Not At Risk (01/08/2024)   Received from [facility]   Humiliation, Afraid, Rape, and Kick questionnaire    Within the last year, have you been afraid of your partner or ex-partner?: No    Within the last year, have you been humiliated or emotionally abused  in other ways by your partner or ex-partner?: No    Within the last year, have you been kicked, hit, slapped, or otherwise physically hurt by your partner or ex-partner?: No    Within the last year, have you been raped or forced to have any kind of sexual activity by your partner or ex-partner?: No   Family History  Problem Relation Age of Onset   Depression Mother    Diabetes Father    Cancer Father        melanoma    Objective: Office vital signs reviewed. BP 127/80   Pulse 94   Temp 97.6 F (36.4 C)   Ht 5' 3 (1.6 m)   Wt (!) 320 lb 12.8 oz (145.5 kg)   SpO2 94%   BMI 56.83 kg/m   Physical Examination:  General: Awake, alert, super morbidly obese, No acute distress HEENT: Slightly pale appearing.  Sclera white.  Moist mucous membranes Cardio: regular rate and rhythm, S1S2 heard, no murmurs appreciated Pulm: clear to auscultation bilaterally, no wheezes, rhonchi or rales; normal work of breathing on room air GI: Obese, protuberant.  I did not undress her wounds as these have just been looked at by general surgery this morning Extremities: 1+ pitting edema to mid shins bilaterally.  No associated erythema but they are somewhat tender to touch  Assessment/ Plan: 38 y.o. female   Acute biliary pancreatitis without infection or necrosis - Plan: Anemia Profile B, CMP14+EGFR, Lipase  Hospital discharge follow-up - Plan: Anemia Profile B, CMP14+EGFR, Lipase  S/P laparoscopic cholecystectomy - Plan: Anemia Profile B, CMP14+EGFR, Lipase  Edema of both lower legs - Plan: furosemide (LASIX) 40 MG tablet   Check anemia profile.  Continue to follow-up with PCP for ongoing management  I reviewed her hospital discharge summary and lab work.  Will repeat lipase and CMP.  Discussed caution with use of NSAID status postcholecystectomy and in the setting of anemia, predominantly due to iron deficiency  and B12 deficiency.  Edema both lower legs present and likely secondary to fluid  and sodium administration in the hospital.  Lasix given for as needed use.  Elevation of lower extremities recommended when not walking but do encourage mobility as tolerated.  I offered home health physical therapy today but she declined this   Kristin CHRISTELLA Fielding, DO [facility] [phone]

## 2024-01-20 NOTE — Patient Instructions (Signed)
 Edema  Edema is when you have too much fluid in your body or under your skin. Edema may make your legs, feet, and ankles swell. Swelling often happens in looser tissues, such as around your eyes. This is a common condition. It gets more common as you get older. There are many possible causes of edema. These include: Eating too much salt (sodium). Being on your feet or sitting for a long time. Certain medical conditions, such as: Pregnancy. Heart failure. Liver disease. Kidney disease. Cancer. Hot weather may make edema worse. Edema is usually painless. Your skin may look swollen or shiny. Follow these instructions at home: Medicines Take over-the-counter and prescription medicines only as told by your doctor. Your doctor may prescribe a medicine to help your body get rid of extra water (diuretic). Take this medicine if you are told to take it. Eating and drinking Eat a low-salt (low-sodium) diet as told by your doctor. Sometimes, eating less salt may reduce swelling. Depending on the cause of your swelling, you may need to limit how much fluid you drink (fluid restriction). General instructions Raise the injured area above the level of your heart while you are sitting or lying down. Do not sit still or stand for a long time. Do not wear tight clothes. Do not wear garters on your upper legs. Exercise your legs. This can help the swelling go down. Wear compression stockings as told by your doctor. It is important that these are the right size. These should be prescribed by your doctor to prevent possible injuries. If elastic bandages or wraps are recommended, use them as told by your doctor. Contact a doctor if: Treatment is not working. You have heart, liver, or kidney disease and have symptoms of edema. You have sudden and unexplained weight gain. Get help right away if: You have shortness of breath or chest pain. You cannot breathe when you lie down. You have pain, redness, or  warmth in the swollen areas. You have heart, liver, or kidney disease and get edema all of a sudden. You have a fever and your symptoms get worse all of a sudden. These symptoms may be an emergency. Get help right away. Call 911. Do not wait to see if the symptoms will go away. Do not drive yourself to the hospital. Summary Edema is when you have too much fluid in your body or under your skin. Edema may make your legs, feet, and ankles swell. Swelling often happens in looser tissues, such as around your eyes. Raise the injured area above the level of your heart while you are sitting or lying down. Follow your doctor's instructions about diet and how much fluid you can drink. This information is not intended to replace advice given to you by your health care provider. Make sure you discuss any questions you have with your health care provider. Document Revised: 11/28/2020 Document Reviewed: 11/28/2020 Elsevier Patient Education  2024 [employer].

## 2024-01-21 ENCOUNTER — Ambulatory Visit: Payer: Self-pay | Admitting: Family Medicine

## 2024-01-21 LAB — ANEMIA PROFILE B
Basophils Absolute: 0 x10E3/uL (ref 0.0–0.2)
Basos: 0 %
EOS (ABSOLUTE): 0.2 x10E3/uL (ref 0.0–0.4)
Eos: 2 %
Ferritin: 129 ng/mL (ref 15–150)
Folate: 19.1 ng/mL (ref >3.0)
Hematocrit: 29.9 % — ABNORMAL LOW (ref 34.0–46.6)
Hemoglobin: 9.1 g/dL — ABNORMAL LOW (ref 11.1–15.9)
Immature Grans (Abs): 0 x10E3/uL (ref 0.0–0.1)
Immature Granulocytes: 0 %
Iron Saturation: 15 % (ref 15–55)
Iron: 51 ug/dL (ref 27–159)
Lymphocytes Absolute: 1.7 x10E3/uL (ref 0.7–3.1)
Lymphs: 24 %
MCH: 26.5 pg — ABNORMAL LOW (ref 26.6–33.0)
MCHC: 30.4 g/dL — ABNORMAL LOW (ref 31.5–35.7)
MCV: 87 fL (ref 79–97)
Monocytes Absolute: 0.4 x10E3/uL (ref 0.1–0.9)
Monocytes: 6 %
Neutrophils Absolute: 4.9 x10E3/uL (ref 1.4–7.0)
Neutrophils: 68 %
Platelets: 394 x10E3/uL (ref 150–450)
RBC: 3.43 x10E6/uL — ABNORMAL LOW (ref 3.77–5.28)
RDW: 17.3 % — ABNORMAL HIGH (ref 11.7–15.4)
Retic Ct Pct: 2.8 % — ABNORMAL HIGH (ref 0.6–2.6)
Total Iron Binding Capacity: 336 ug/dL (ref 250–450)
UIBC: 285 ug/dL (ref 131–425)
Vitamin B-12: 1331 pg/mL — ABNORMAL HIGH (ref 232–1245)
WBC: 7.3 x10E3/uL (ref 3.4–10.8)

## 2024-01-21 LAB — CMP14+EGFR
ALT: 11 IU/L (ref 0–32)
AST: 15 IU/L (ref 0–40)
Albumin: 3.4 g/dL — ABNORMAL LOW (ref 3.9–4.9)
Alkaline Phosphatase: 101 IU/L (ref 41–116)
BUN/Creatinine Ratio: 10 (ref 9–23)
BUN: 12 mg/dL (ref 6–24)
Bilirubin Total: 0.4 mg/dL (ref 0.0–1.2)
CO2: 21 mmol/L (ref 20–29)
Calcium: 9 mg/dL (ref 8.7–10.2)
Chloride: 103 mmol/L (ref 96–106)
Creatinine, Ser: 1.26 mg/dL — ABNORMAL HIGH (ref 0.57–1.00)
Globulin, Total: 2.3 g/dL (ref 1.5–4.5)
Glucose: 118 mg/dL — ABNORMAL HIGH (ref 70–99)
Potassium: 4.7 mmol/L (ref 3.5–5.2)
Sodium: 137 mmol/L (ref 134–144)
Total Protein: 5.7 g/dL — ABNORMAL LOW (ref 6.0–8.5)
eGFR: 55 mL/min/1.73 — ABNORMAL LOW (ref >59)

## 2024-01-21 LAB — LIPASE: Lipase: 78 U/L — ABNORMAL HIGH (ref 14–72)

## 2024-01-21 NOTE — Telephone Encounter (Signed)
 Patient aware and verbalized understanding.

## 2024-01-29 ENCOUNTER — Ambulatory Visit

## 2024-02-02 ENCOUNTER — Other Ambulatory Visit: Payer: Self-pay | Admitting: Family

## 2024-02-02 DIAGNOSIS — G8929 Other chronic pain: Secondary | ICD-10-CM

## 2024-02-14 ENCOUNTER — Ambulatory Visit (INDEPENDENT_AMBULATORY_CARE_PROVIDER_SITE_OTHER): Admitting: *Deleted

## 2024-02-14 ENCOUNTER — Other Ambulatory Visit (HOSPITAL_COMMUNITY): Payer: Self-pay | Admitting: Surgery

## 2024-02-14 DIAGNOSIS — E538 Deficiency of other specified B group vitamins: Secondary | ICD-10-CM | POA: Diagnosis not present

## 2024-02-14 DIAGNOSIS — G8918 Other acute postprocedural pain: Secondary | ICD-10-CM

## 2024-02-14 NOTE — Telephone Encounter (Signed)
 Patient agreeable to do Stat MRCP at [facility],  order faxed to [phone].  They have opening at 5pm on 02/15/2024 and an opening on Sunday.   Fax confirmation received.   Patient aware and given central scheduling number [phone] for [facility].     Called and changed precert location with patient's insurance and spoke with Yolande HERO, [place] number [id number], correlates with original #[id number] [employer] number.   Same valid dates 02/11/2024 to 05/10/2024.

## 2024-02-14 NOTE — Progress Notes (Addendum)
 Patient is in office today for a nurse visit for B12 Injection.  Injection was given in the  Right deltoid. Patient tolerated injection well.

## 2024-02-15 ENCOUNTER — Ambulatory Visit (HOSPITAL_COMMUNITY)
Admission: RE | Admit: 2024-02-15 | Discharge: 2024-02-15 | Disposition: A | Discharge status: Home / Self Care | Source: Ambulatory Visit | Attending: Surgery | Admitting: Surgery

## 2024-02-15 ENCOUNTER — Other Ambulatory Visit (HOSPITAL_COMMUNITY): Payer: Self-pay | Admitting: Surgery

## 2024-02-15 DIAGNOSIS — G8918 Other acute postprocedural pain: Secondary | ICD-10-CM

## 2024-02-15 MED ORDER — GADOBUTROL 1 MMOL/ML IV SOLN
10.0000 mL | Freq: Once | INTRAVENOUS | Status: AC | PRN
  Administered 2024-02-15: 10 mL via INTRAVENOUS

## 2024-03-01 ENCOUNTER — Other Ambulatory Visit: Payer: Self-pay | Admitting: Family

## 2024-03-01 DIAGNOSIS — Z3009 Encounter for other general counseling and advice on contraception: Secondary | ICD-10-CM

## 2024-03-01 DIAGNOSIS — G8929 Other chronic pain: Secondary | ICD-10-CM

## 2024-03-06 ENCOUNTER — Other Ambulatory Visit: Payer: Self-pay | Admitting: Family

## 2024-03-06 DIAGNOSIS — G629 Polyneuropathy, unspecified: Secondary | ICD-10-CM

## 2024-03-15 ENCOUNTER — Other Ambulatory Visit: Payer: Self-pay | Admitting: Family

## 2024-03-15 DIAGNOSIS — E559 Vitamin D deficiency, unspecified: Secondary | ICD-10-CM

## 2024-03-16 ENCOUNTER — Ambulatory Visit

## 2024-03-18 ENCOUNTER — Other Ambulatory Visit: Payer: Self-pay | Admitting: Family

## 2024-03-23 ENCOUNTER — Ambulatory Visit

## 2024-03-23 DIAGNOSIS — E538 Deficiency of other specified B group vitamins: Secondary | ICD-10-CM

## 2024-03-23 NOTE — Progress Notes (Signed)
 Patient is in office today for a nurse visit for B12 Injection. Patient Injection was given in the  Left deltoid. Patient tolerated injection well.

## 2024-03-24 ENCOUNTER — Ambulatory Visit: Payer: Self-pay | Admitting: Family

## 2024-03-24 ENCOUNTER — Encounter: Payer: Self-pay | Admitting: Family

## 2024-03-24 VITALS — BP 135/85 | HR 81 | Temp 97.1°F | Ht 63.0 in | Wt 278.2 lb

## 2024-03-24 DIAGNOSIS — G8929 Other chronic pain: Secondary | ICD-10-CM

## 2024-03-24 DIAGNOSIS — Z0000 Encounter for general adult medical examination without abnormal findings: Secondary | ICD-10-CM

## 2024-03-24 DIAGNOSIS — D509 Iron deficiency anemia, unspecified: Secondary | ICD-10-CM

## 2024-03-24 DIAGNOSIS — R5383 Other fatigue: Secondary | ICD-10-CM

## 2024-03-24 DIAGNOSIS — Z3009 Encounter for other general counseling and advice on contraception: Secondary | ICD-10-CM

## 2024-03-24 DIAGNOSIS — F17200 Nicotine dependence, unspecified, uncomplicated: Secondary | ICD-10-CM

## 2024-03-24 DIAGNOSIS — K5900 Constipation, unspecified: Secondary | ICD-10-CM

## 2024-03-24 DIAGNOSIS — K219 Gastro-esophageal reflux disease without esophagitis: Secondary | ICD-10-CM

## 2024-03-24 DIAGNOSIS — E538 Deficiency of other specified B group vitamins: Secondary | ICD-10-CM

## 2024-03-24 DIAGNOSIS — G610 Guillain-Barre syndrome: Secondary | ICD-10-CM

## 2024-03-24 DIAGNOSIS — F320 Major depressive disorder, single episode, mild: Secondary | ICD-10-CM

## 2024-03-24 DIAGNOSIS — Z23 Encounter for immunization: Secondary | ICD-10-CM

## 2024-03-24 DIAGNOSIS — E559 Vitamin D deficiency, unspecified: Secondary | ICD-10-CM

## 2024-03-24 MED ORDER — BACLOFEN 10 MG PO TABS: ORAL_TABLET | ORAL | 0 refills | Status: DC

## 2024-03-24 MED ORDER — OMEPRAZOLE 40 MG PO CPDR: 40.0000 mg | DELAYED_RELEASE_CAPSULE | Freq: Every day | ORAL | 1 refills | Status: AC

## 2024-03-24 MED ORDER — NORGESTIMATE-ETH ESTRADIOL 0.25-35 MG-MCG PO TABS: 1.0000 | ORAL_TABLET | Freq: Every day | ORAL | 0 refills | Status: AC

## 2024-03-24 NOTE — Progress Notes (Signed)
 Subjective:    Patient ID: Kristin Ballard, female    DOB: Dec 14, 1985, 38 y.o.   MRN: [id number]  Chief Complaint  Patient presents with   Medical Management of Chronic Issues   Pt presents to the office today for CPE.    She is followed by Neurologists as needed for guillain-barre syndrome. She completed PT.    She was taking OC and doing well.    She is followed by Ortho for chronic back pain. She got steroid injections without relief. Followed by Pain Management monthly.   Reports having increased numbness and tingling in bilateral legs and feet. She takes gabapentin  400 mg TID and Cymbalta  60 mg.  She has lost 42 lbs since 01/20/24. Reports most of that was fluid.      03/24/2024    9:54 AM 01/20/2024    3:19 PM 09/23/2023   10:01 AM  Last 3 Weights  Weight (lbs) 278 lb 3.2 oz 320 lb 12.8 oz 293 lb  Weight (kg) 126.191 kg 145.514 kg 132.904 kg   Hypertension This is a chronic problem. The current episode started more than 1 year ago. The problem has been resolved since onset. The problem is controlled. Associated symptoms include malaise/fatigue. Pertinent negatives include no peripheral edema or shortness of breath. Risk factors for coronary artery disease include obesity and dyslipidemia. The current treatment provides moderate improvement.  Nicotine Dependence Presents for follow-up visit. She smokes < 1/2 a pack of cigarettes per day.  Gastroesophageal Reflux She complains of belching, heartburn and a hoarse voice. This is a chronic problem. The current episode started more than 1 year ago. The problem occurs occasionally. The symptoms are aggravated by certain foods. Risk factors include obesity. She has tried a PPI for the symptoms. The treatment provided moderate relief.  Back Pain This is a chronic problem. The current episode started more than 1 year ago. The problem occurs intermittently. The pain is present in the lumbar spine. The pain is at a severity of 5/10.  The pain is moderate. The symptoms are aggravated by twisting and standing. Risk factors include obesity. She has tried home exercises and bed rest for the symptoms. The treatment provided no relief.  Anemia Presents for follow-up visit. Symptoms include malaise/fatigue.  Depression        This is a chronic problem.  The current episode started more than 1 year ago.   The problem occurs intermittently.  Associated symptoms include no helplessness, no hopelessness and not sad.  Past treatments include SNRIs - Serotonin and norepinephrine reuptake inhibitors. Constipation This is a chronic problem. The current episode started more than 1 year ago. Her stool frequency is 1 time per day. Associated symptoms include back pain. Risk factors include obesity. She has tried diet changes for the symptoms. The treatment provided moderate relief.      Review of Systems  Constitutional:  Positive for malaise/fatigue.  HENT:  Positive for hoarse voice.   Respiratory:  Negative for shortness of breath.   Gastrointestinal:  Positive for constipation and heartburn.  Musculoskeletal:  Positive for back pain.  All other systems reviewed and are negative.  Family History  Problem Relation Age of Onset   Depression Mother    Diabetes Father    Cancer Father        melanoma   Social History   Socioeconomic History   Marital status: Unknown    Spouse name: Not on file   Number of children: Not on file  Years of education: Not on file   Highest education level: Not on file  Occupational History   Not on file  Tobacco Use   Smoking status: Heavy Smoker    Current packs/day: 0.50    Types: Cigarettes   Smokeless tobacco: Never  Vaping Use   Vaping status: Never Used  Substance and Sexual Activity   Alcohol use: No   Drug use: Yes    Types: Marijuana   Sexual activity: Yes    Birth control/protection: Pill  Other Topics Concern   Not on file  Social History Narrative   Not on file    Social Drivers of Health   Tobacco Use: High Risk (03/24/2024)   Patient History    Smoking Tobacco Use: Heavy Smoker    Smokeless Tobacco Use: Never    Passive Exposure: Not on file  Financial Resource Strain: Low Risk (01/08/2024)   Received from [facility]   Overall Financial Resource Strain (CARDIA)    How hard is it for you to pay for the very basics like food, housing, medical care, and heating?: Not very hard  Food Insecurity: No Food Insecurity (01/08/2024)   Received from [facility]   Epic    Within the past 12 months, you worried that your food would run out before you got the money to buy more.: Never true    Within the past 12 months, the food you bought just didn't last and you didn't have money to get more.: Never true  Transportation Needs: No Transportation Needs (01/08/2024)   Received from [facility] - Transportation    Lack of Transportation (Medical): No    Lack of Transportation (Non-Medical): No  Physical Activity: Inactive (01/08/2024)   Received from [facility]   Exercise Vital Sign    On average, how many days per week do you engage in moderate to strenuous exercise (like a brisk walk)?: 0 days    On average, how many minutes do you engage in exercise at this level?: 0 min  Stress: No Stress Concern Present (01/08/2024)   Received from [facility] - Occupational Stress Questionnaire    Do you feel stress - tense, restless, nervous, or anxious, or unable to sleep at night because your mind is troubled all the time - these days?: Not at all  Social Connections: Socially Isolated (01/08/2024)   Received from [facility]   Social Connection and Isolation Panel    In a typical week, how many times do you talk on the phone with family, friends, or neighbors?: More than three times a week    How often do you get together with friends or relatives?: Once a week    How often do you attend  church or religious services?: Never    Do you belong to any clubs or organizations such as church groups, unions, fraternal or athletic groups, or school groups?: No    How often do you attend meetings of the clubs or organizations you belong to?: Never    Are you married, widowed, divorced, separated, never married, or living with a partner?: Separated  Depression (PHQ2-9): Low Risk (03/24/2024)   Depression (PHQ2-9)    PHQ-2 Score: 4  Recent Concern: Depression (PHQ2-9) - Medium Risk (01/20/2024)   Depression (PHQ2-9)    PHQ-2 Score: 8  Alcohol Screen: Not on file  Housing: Not on file  Utilities: Low Risk (01/08/2024)  Received from [facility]   Utilities    Within the past 12 months, have you been unable to get utilities(heat, electricity) when it was really needed?: No  Health Literacy: Low Risk (01/08/2024)   Received from [facility] Literacy    How often do you need to have someone help you when you read instructions, pamphlets, or other written material from your doctor or pharmacy?: Never       Objective:   Physical Exam Vitals reviewed.  Constitutional:      General: She is not in acute distress.    Appearance: She is well-developed. She is obese.  HENT:     Head: Normocephalic and atraumatic.     Comments: Hoarse voice    Right Ear: Tympanic membrane normal.     Left Ear: Tympanic membrane normal.  Eyes:     Pupils: Pupils are equal, round, and reactive to light.  Neck:     Thyroid : No thyromegaly.  Cardiovascular:     Rate and Rhythm: Normal rate and regular rhythm.     Heart sounds: Normal heart sounds. No murmur heard. Pulmonary:     Effort: Pulmonary effort is normal. No respiratory distress.     Breath sounds: Normal breath sounds. No wheezing.  Abdominal:     General: Bowel sounds are normal. There is no distension.     Palpations: Abdomen is soft.     Tenderness: There is no abdominal tenderness.  Musculoskeletal:        General:  No tenderness. Normal range of motion.     Cervical back: Normal range of motion and neck supple.  Skin:    General: Skin is warm and dry.  Neurological:     Mental Status: She is alert and oriented to person, place, and time.     Cranial Nerves: No cranial nerve deficit.     Deep Tendon Reflexes: Reflexes are normal and symmetric.  Psychiatric:        Behavior: Behavior normal.        Thought Content: Thought content normal.        Judgment: Judgment normal.       BP 135/85   Pulse 81   Temp (!) 97.1 F (36.2 C) (Temporal)   Ht 5' 3 (1.6 m)   Wt 278 lb 3.2 oz (126.2 kg)   SpO2 99%   BMI 49.28 kg/m      Assessment & Plan:  Kristin Ballard comes in today with chief complaint of Medical Management of Chronic Issues   Diagnosis and orders addressed:  1. Immunization due - Pneumococcal conjugate vaccine 20-valent (Prevnar 20) - CMP14+EGFR  2. Chronic bilateral low back pain without sciatica - baclofen  (LIORESAL ) 10 MG tablet; TAKE 1/2 (ONE-HALF) TABLET BY MOUTH 4 TIMES DAILY AS NEEDED  Dispense: 90 tablet; Refill: 0 - CMP14+EGFR  3. Gastroesophageal reflux disease, unspecified whether esophagitis present - omeprazole  (PRILOSEC) 40 MG capsule; Take 1 capsule (40 mg total) by mouth daily.  Dispense: 90 capsule; Refill: 1 - CMP14+EGFR  4. Birth control counseling - norgestimate -ethinyl estradiol  (SPRINTEC 28) 0.25-35 MG-MCG tablet; Take 1 tablet by mouth daily.  Dispense: 84 tablet; Refill: 0 - CMP14+EGFR  5. Constipation, unspecified constipation type - CMP14+EGFR  6. Vitamin D  deficiency - CMP14+EGFR  7. Vitamin B 12 deficiency - CMP14+EGFR - Anemia Profile B  8. Morbid obesity (HCC) - CMP14+EGFR  9. Iron deficiency anemia, unspecified iron deficiency anemia type - CMP14+EGFR - Anemia Profile B  10. Depression, major, single episode, mild - CMP14+EGFR  11. Current smoker - CMP14+EGFR  12. Guillain Barr syndrome - CMP14+EGFR  13. Annual  physical exam (Primary) - baclofen  (LIORESAL ) 10 MG tablet; TAKE 1/2 (ONE-HALF) TABLET BY MOUTH 4 TIMES DAILY AS NEEDED  Dispense: 90 tablet; Refill: 0 - omeprazole  (PRILOSEC) 40 MG capsule; Take 1 capsule (40 mg total) by mouth daily.  Dispense: 90 capsule; Refill: 1 - norgestimate -ethinyl estradiol  (SPRINTEC 28) 0.25-35 MG-MCG tablet; Take 1 tablet by mouth daily.  Dispense: 84 tablet; Refill: 0 - CMP14+EGFR - Anemia Profile B - Lipid panel - TSH  14. Other fatigue - CMP14+EGFR - Anemia Profile B - TSH   Labs pending Continue current medications  Keep specialists follow up  Health Maintenance reviewed Diet and exercise encouraged  Follow up plan: 6 months   Bari Learn, FNP

## 2024-03-24 NOTE — Patient Instructions (Signed)

## 2024-03-25 LAB — ANEMIA PROFILE B
Basophils Absolute: 0 x10E3/uL (ref 0.0–0.2)
Basos: 1 %
EOS (ABSOLUTE): 0.1 x10E3/uL (ref 0.0–0.4)
Eos: 2 %
Ferritin: 147 ng/mL (ref 15–150)
Folate: >20 ng/mL (ref >3.0)
Hematocrit: 37.8 % (ref 34.0–46.6)
Hemoglobin: 12.1 g/dL (ref 11.1–15.9)
Immature Grans (Abs): 0 x10E3/uL (ref 0.0–0.1)
Immature Granulocytes: 0 %
Iron Saturation: 30 % (ref 15–55)
Iron: 135 ug/dL (ref 27–159)
Lymphocytes Absolute: 2.4 x10E3/uL (ref 0.7–3.1)
Lymphs: 38 %
MCH: 29.2 pg (ref 26.6–33.0)
MCHC: 32 g/dL (ref 31.5–35.7)
MCV: 91 fL (ref 79–97)
Monocytes Absolute: 0.3 x10E3/uL (ref 0.1–0.9)
Monocytes: 5 %
Neutrophils Absolute: 3.4 x10E3/uL (ref 1.4–7.0)
Neutrophils: 54 %
Platelets: 263 x10E3/uL (ref 150–450)
RBC: 4.14 x10E6/uL (ref 3.77–5.28)
RDW: 13.4 % (ref 11.7–15.4)
Retic Ct Pct: 1.2 % (ref 0.6–2.6)
Total Iron Binding Capacity: 448 ug/dL (ref 250–450)
UIBC: 313 ug/dL (ref 131–425)
Vitamin B-12: 781 pg/mL (ref 232–1245)
WBC: 6.2 x10E3/uL (ref 3.4–10.8)

## 2024-03-25 LAB — LIPID PANEL
Chol/HDL Ratio: 2.3 ratio (ref 0.0–4.4)
Cholesterol, Total: 188 mg/dL (ref 100–199)
HDL: 81 mg/dL (ref >39)
LDL Chol Calc (NIH): 85 mg/dL (ref 0–99)
Triglycerides: 131 mg/dL (ref 0–149)
VLDL Cholesterol Cal: 22 mg/dL (ref 5–40)

## 2024-03-25 LAB — CMP14+EGFR
ALT: 18 IU/L (ref 0–32)
AST: 41 IU/L — ABNORMAL HIGH (ref 0–40)
Albumin: 4.1 g/dL (ref 3.9–4.9)
Alkaline Phosphatase: 87 IU/L (ref 41–116)
BUN/Creatinine Ratio: 8 — ABNORMAL LOW (ref 9–23)
BUN: 6 mg/dL (ref 6–24)
Bilirubin Total: 0.2 mg/dL (ref 0.0–1.2)
CO2: 21 mmol/L (ref 20–29)
Calcium: 9.2 mg/dL (ref 8.7–10.2)
Chloride: 100 mmol/L (ref 96–106)
Creatinine, Ser: 0.74 mg/dL (ref 0.57–1.00)
Globulin, Total: 2.7 g/dL (ref 1.5–4.5)
Glucose: 115 mg/dL — ABNORMAL HIGH (ref 70–99)
Potassium: 4.3 mmol/L (ref 3.5–5.2)
Sodium: 138 mmol/L (ref 134–144)
Total Protein: 6.8 g/dL (ref 6.0–8.5)
eGFR: 105 mL/min/1.73 (ref >59)

## 2024-03-25 LAB — TSH: TSH: 1.67 u[IU]/mL (ref 0.450–4.500)

## 2024-03-30 ENCOUNTER — Other Ambulatory Visit: Payer: Self-pay | Admitting: Family

## 2024-04-10 ENCOUNTER — Other Ambulatory Visit: Payer: Self-pay | Admitting: Family Medicine

## 2024-04-10 DIAGNOSIS — Z1231 Encounter for screening mammogram for malignant neoplasm of breast: Secondary | ICD-10-CM

## 2024-04-13 ENCOUNTER — Inpatient Hospital Stay: Admission: RE | Admit: 2024-04-13 | Source: Ambulatory Visit

## 2024-04-23 ENCOUNTER — Ambulatory Visit (INDEPENDENT_AMBULATORY_CARE_PROVIDER_SITE_OTHER): Admitting: *Deleted

## 2024-04-23 DIAGNOSIS — E538 Deficiency of other specified B group vitamins: Secondary | ICD-10-CM

## 2024-04-23 NOTE — Progress Notes (Signed)
 Patient is in office today for a nurse visit for B12 Injection. Patient Injection was given in the  Right deltoid. Patient tolerated injection well.

## 2024-04-25 ENCOUNTER — Other Ambulatory Visit: Payer: Self-pay | Admitting: Family

## 2024-04-25 DIAGNOSIS — G8929 Other chronic pain: Secondary | ICD-10-CM

## 2024-04-25 DIAGNOSIS — Z0000 Encounter for general adult medical examination without abnormal findings: Secondary | ICD-10-CM

## 2024-04-25 DIAGNOSIS — M545 Low back pain, unspecified: Secondary | ICD-10-CM

## 2024-04-28 ENCOUNTER — Other Ambulatory Visit: Payer: Self-pay | Admitting: Family

## 2024-04-28 DIAGNOSIS — E559 Vitamin D deficiency, unspecified: Secondary | ICD-10-CM

## 2024-05-25 ENCOUNTER — Ambulatory Visit

## 2024-09-22 ENCOUNTER — Ambulatory Visit: Admitting: Family
